# Patient Record
Sex: Male | Born: 1940 | Race: Black or African American | Hispanic: No | Smoking: Former smoker
Health system: Southern US, Community
[De-identification: ages and names within clinical notes are randomized; demographics above are authoritative.]

## PROBLEM LIST (undated history)

## (undated) DIAGNOSIS — K219 Gastro-esophageal reflux disease without esophagitis: Secondary | ICD-10-CM

## (undated) DIAGNOSIS — I2510 Atherosclerotic heart disease of native coronary artery without angina pectoris: Secondary | ICD-10-CM

## (undated) DIAGNOSIS — R1319 Other dysphagia: Secondary | ICD-10-CM

## (undated) DIAGNOSIS — E785 Hyperlipidemia, unspecified: Secondary | ICD-10-CM

## (undated) DIAGNOSIS — G309 Alzheimer's disease, unspecified: Secondary | ICD-10-CM

## (undated) DIAGNOSIS — Z8719 Personal history of other diseases of the digestive system: Secondary | ICD-10-CM

## (undated) DIAGNOSIS — I10 Essential (primary) hypertension: Secondary | ICD-10-CM

## (undated) DIAGNOSIS — M069 Rheumatoid arthritis, unspecified: Secondary | ICD-10-CM

## (undated) DIAGNOSIS — F0280 Dementia in other diseases classified elsewhere without behavioral disturbance: Secondary | ICD-10-CM

## (undated) DIAGNOSIS — Z9889 Other specified postprocedural states: Secondary | ICD-10-CM

## (undated) DIAGNOSIS — K222 Esophageal obstruction: Secondary | ICD-10-CM

## (undated) HISTORY — DX: Other dysphagia: R13.19

## (undated) HISTORY — DX: Essential (primary) hypertension: I10

## (undated) HISTORY — DX: Gastro-esophageal reflux disease without esophagitis: K21.9

## (undated) HISTORY — DX: Hyperlipidemia, unspecified: E78.5

## (undated) HISTORY — PX: OTHER SURGICAL HISTORY: SHX169

## (undated) HISTORY — DX: Esophageal obstruction: K22.2

## (undated) HISTORY — DX: Rheumatoid arthritis, unspecified: M06.9

## (undated) HISTORY — DX: Atherosclerotic heart disease of native coronary artery without angina pectoris: I25.10

## (undated) HISTORY — DX: Other specified postprocedural states: Z98.890

## (undated) HISTORY — PX: CATARACT EXTRACTION, BILATERAL: SHX1313

## (undated) HISTORY — DX: Personal history of other diseases of the digestive system: Z87.19

---

## 1994-10-05 HISTORY — PX: CORONARY ARTERY BYPASS GRAFT: SHX141

## 2002-04-03 ENCOUNTER — Emergency Department (HOSPITAL_COMMUNITY): Admission: EM | Admit: 2002-04-03 | Discharge: 2002-04-03 | Payer: Self-pay | Admitting: Emergency Medicine

## 2004-12-31 ENCOUNTER — Ambulatory Visit: Payer: Self-pay | Admitting: Cardiology

## 2005-01-01 ENCOUNTER — Ambulatory Visit: Payer: Self-pay | Admitting: Cardiology

## 2005-01-08 ENCOUNTER — Ambulatory Visit: Payer: Self-pay

## 2005-01-22 ENCOUNTER — Ambulatory Visit: Payer: Self-pay | Admitting: Cardiology

## 2005-01-27 ENCOUNTER — Ambulatory Visit (HOSPITAL_COMMUNITY): Admission: RE | Admit: 2005-01-27 | Discharge: 2005-01-27 | Payer: Self-pay | Admitting: Cardiology

## 2005-01-27 ENCOUNTER — Ambulatory Visit: Payer: Self-pay | Admitting: Cardiology

## 2005-03-03 ENCOUNTER — Ambulatory Visit: Payer: Self-pay | Admitting: Cardiology

## 2005-08-10 ENCOUNTER — Ambulatory Visit: Payer: Self-pay | Admitting: Cardiology

## 2005-08-12 ENCOUNTER — Ambulatory Visit: Payer: Self-pay | Admitting: Cardiology

## 2006-06-30 ENCOUNTER — Ambulatory Visit: Payer: Self-pay | Admitting: Cardiology

## 2006-07-05 ENCOUNTER — Ambulatory Visit: Payer: Self-pay | Admitting: Cardiology

## 2006-10-08 ENCOUNTER — Ambulatory Visit: Payer: Self-pay | Admitting: Cardiology

## 2006-11-05 ENCOUNTER — Ambulatory Visit: Payer: Self-pay | Admitting: Cardiology

## 2006-11-05 LAB — CONVERTED CEMR LAB
BUN: 14 mg/dL (ref 6–23)
CO2: 29 meq/L (ref 19–32)
Calcium: 9.4 mg/dL (ref 8.4–10.5)
Chloride: 104 meq/L (ref 96–112)
Creatinine, Ser: 1.1 mg/dL (ref 0.4–1.5)
GFR calc Af Amer: 86 mL/min
GFR calc non Af Amer: 71 mL/min
Glucose, Bld: 108 mg/dL — ABNORMAL HIGH (ref 70–99)
Potassium: 4.2 meq/L (ref 3.5–5.1)
Sodium: 139 meq/L (ref 135–145)

## 2006-11-16 ENCOUNTER — Ambulatory Visit: Payer: Self-pay | Admitting: Cardiology

## 2006-11-16 LAB — CONVERTED CEMR LAB
ALT: 40 units/L (ref 0–40)
AST: 32 units/L (ref 0–37)
Albumin: 3.4 g/dL — ABNORMAL LOW (ref 3.5–5.2)
Alkaline Phosphatase: 64 units/L (ref 39–117)
BUN: 16 mg/dL (ref 6–23)
Bilirubin, Direct: 0.1 mg/dL (ref 0.0–0.3)
CO2: 26 meq/L (ref 19–32)
Calcium: 9.5 mg/dL (ref 8.4–10.5)
Chloride: 96 meq/L (ref 96–112)
Creatinine, Ser: 0.9 mg/dL (ref 0.4–1.5)
GFR calc Af Amer: 109 mL/min
GFR calc non Af Amer: 90 mL/min
Glucose, Bld: 102 mg/dL — ABNORMAL HIGH (ref 70–99)
Potassium: 3.7 meq/L (ref 3.5–5.1)
Sed Rate: 50 mm/hr — ABNORMAL HIGH (ref 0–20)
Sodium: 135 meq/L (ref 135–145)
TSH: 0.5 microintl units/mL (ref 0.35–5.50)
Total Bilirubin: 1.1 mg/dL (ref 0.3–1.2)
Total CK: 244 units/L (ref 7–195)
Total Protein: 7.8 g/dL (ref 6.0–8.3)

## 2006-11-17 ENCOUNTER — Ambulatory Visit: Payer: Self-pay | Admitting: Internal Medicine

## 2006-11-24 ENCOUNTER — Encounter: Payer: Self-pay | Admitting: Internal Medicine

## 2006-11-24 LAB — CONVERTED CEMR LAB
ANA Titer 1: 1:160 {titer} — ABNORMAL HIGH
Anti Nuclear Antibody(ANA): POSITIVE — AB

## 2006-12-03 ENCOUNTER — Ambulatory Visit: Payer: Self-pay | Admitting: Internal Medicine

## 2007-01-11 ENCOUNTER — Ambulatory Visit: Payer: Self-pay | Admitting: Cardiology

## 2007-05-06 ENCOUNTER — Ambulatory Visit: Payer: Self-pay | Admitting: Internal Medicine

## 2007-07-07 ENCOUNTER — Ambulatory Visit: Payer: Self-pay | Admitting: Cardiology

## 2007-08-10 ENCOUNTER — Encounter: Payer: Self-pay | Admitting: Internal Medicine

## 2007-11-10 ENCOUNTER — Encounter: Payer: Self-pay | Admitting: Internal Medicine

## 2008-02-04 ENCOUNTER — Encounter: Payer: Self-pay | Admitting: *Deleted

## 2008-02-04 DIAGNOSIS — E785 Hyperlipidemia, unspecified: Secondary | ICD-10-CM

## 2008-02-04 DIAGNOSIS — Z9189 Other specified personal risk factors, not elsewhere classified: Secondary | ICD-10-CM | POA: Insufficient documentation

## 2008-02-04 DIAGNOSIS — Z951 Presence of aortocoronary bypass graft: Secondary | ICD-10-CM | POA: Insufficient documentation

## 2008-02-04 DIAGNOSIS — D126 Benign neoplasm of colon, unspecified: Secondary | ICD-10-CM | POA: Insufficient documentation

## 2008-02-04 DIAGNOSIS — I2510 Atherosclerotic heart disease of native coronary artery without angina pectoris: Secondary | ICD-10-CM

## 2008-02-04 DIAGNOSIS — I10 Essential (primary) hypertension: Secondary | ICD-10-CM | POA: Insufficient documentation

## 2008-02-07 ENCOUNTER — Encounter: Payer: Self-pay | Admitting: Internal Medicine

## 2008-06-05 ENCOUNTER — Encounter: Payer: Self-pay | Admitting: Internal Medicine

## 2008-07-03 DIAGNOSIS — F17200 Nicotine dependence, unspecified, uncomplicated: Secondary | ICD-10-CM

## 2008-07-04 ENCOUNTER — Ambulatory Visit: Payer: Self-pay | Admitting: Internal Medicine

## 2008-07-04 DIAGNOSIS — K219 Gastro-esophageal reflux disease without esophagitis: Secondary | ICD-10-CM | POA: Insufficient documentation

## 2008-07-04 DIAGNOSIS — R1319 Other dysphagia: Secondary | ICD-10-CM | POA: Insufficient documentation

## 2008-07-04 DIAGNOSIS — M069 Rheumatoid arthritis, unspecified: Secondary | ICD-10-CM | POA: Insufficient documentation

## 2008-07-04 DIAGNOSIS — R634 Abnormal weight loss: Secondary | ICD-10-CM

## 2008-07-05 ENCOUNTER — Ambulatory Visit: Payer: Self-pay | Admitting: Cardiology

## 2008-07-09 ENCOUNTER — Ambulatory Visit: Payer: Self-pay | Admitting: Internal Medicine

## 2008-07-09 ENCOUNTER — Encounter: Payer: Self-pay | Admitting: Internal Medicine

## 2008-07-09 ENCOUNTER — Ambulatory Visit: Payer: Self-pay | Admitting: Cardiology

## 2008-07-09 LAB — CONVERTED CEMR LAB
Cholesterol: 133 mg/dL (ref 0–200)
HDL: 37.7 mg/dL — ABNORMAL LOW (ref >39.0)
LDL Cholesterol: 79 mg/dL (ref 0–99)
Total CHOL/HDL Ratio: 3.5
Triglycerides: 83 mg/dL (ref 0–149)
VLDL: 17 mg/dL (ref 0–40)

## 2008-07-16 ENCOUNTER — Encounter: Payer: Self-pay | Admitting: Internal Medicine

## 2008-08-28 ENCOUNTER — Ambulatory Visit: Payer: Self-pay | Admitting: Internal Medicine

## 2008-10-09 ENCOUNTER — Encounter: Payer: Self-pay | Admitting: Internal Medicine

## 2009-03-14 ENCOUNTER — Encounter: Payer: Self-pay | Admitting: Internal Medicine

## 2009-05-22 ENCOUNTER — Encounter (INDEPENDENT_AMBULATORY_CARE_PROVIDER_SITE_OTHER): Payer: Self-pay | Admitting: *Deleted

## 2009-07-31 ENCOUNTER — Ambulatory Visit: Payer: Self-pay | Admitting: Cardiology

## 2009-08-07 ENCOUNTER — Telehealth (INDEPENDENT_AMBULATORY_CARE_PROVIDER_SITE_OTHER): Payer: Self-pay

## 2009-08-08 ENCOUNTER — Encounter (HOSPITAL_COMMUNITY): Admission: RE | Admit: 2009-08-08 | Discharge: 2009-10-02 | Payer: Self-pay | Admitting: Cardiology

## 2009-08-08 ENCOUNTER — Ambulatory Visit: Payer: Self-pay | Admitting: Internal Medicine

## 2009-08-08 ENCOUNTER — Ambulatory Visit: Payer: Self-pay | Admitting: Cardiology

## 2009-08-08 ENCOUNTER — Ambulatory Visit: Payer: Self-pay

## 2009-08-08 DIAGNOSIS — I10 Essential (primary) hypertension: Secondary | ICD-10-CM

## 2009-08-22 ENCOUNTER — Encounter: Payer: Self-pay | Admitting: Cardiology

## 2009-08-22 ENCOUNTER — Encounter: Payer: Self-pay | Admitting: Internal Medicine

## 2009-08-22 LAB — CONVERTED CEMR LAB
ALT: 19 units/L (ref 0–53)
AST: 22 units/L (ref 0–37)
Albumin: 3.8 g/dL (ref 3.5–5.2)
Alkaline Phosphatase: 68 units/L (ref 39–117)
Bilirubin, Direct: 0 mg/dL (ref 0.0–0.3)
Cholesterol: 182 mg/dL (ref 0–200)
HDL: 39.9 mg/dL (ref >39.00)
LDL Cholesterol: 127 mg/dL — ABNORMAL HIGH (ref 0–99)
Total Bilirubin: 1 mg/dL (ref 0.3–1.2)
Total CHOL/HDL Ratio: 5
Total Protein: 7.4 g/dL (ref 6.0–8.3)
Triglycerides: 76 mg/dL (ref 0.0–149.0)
VLDL: 15.2 mg/dL (ref 0.0–40.0)

## 2009-09-02 ENCOUNTER — Ambulatory Visit: Payer: Self-pay

## 2009-09-02 ENCOUNTER — Encounter: Payer: Self-pay | Admitting: Cardiology

## 2009-09-02 ENCOUNTER — Ambulatory Visit (HOSPITAL_COMMUNITY): Admission: RE | Admit: 2009-09-02 | Discharge: 2009-09-02 | Payer: Self-pay | Admitting: Cardiology

## 2009-09-02 ENCOUNTER — Ambulatory Visit: Payer: Self-pay | Admitting: Cardiology

## 2010-06-19 ENCOUNTER — Encounter: Payer: Self-pay | Admitting: Internal Medicine

## 2010-11-04 NOTE — Letter (Signed)
Summary: Stacey Drain MD  Stacey Drain MD   Imported By: Lennie Odor 06/27/2010 11:51:32  _____________________________________________________________________  External Attachment:    Type:   Image     Comment:   External Document

## 2010-11-12 ENCOUNTER — Encounter: Payer: Self-pay | Admitting: Cardiology

## 2010-12-03 ENCOUNTER — Other Ambulatory Visit: Payer: Self-pay | Admitting: Dermatology

## 2010-12-16 NOTE — Letter (Signed)
Summary: [facility] Office Visit Note   [facility] Office Visit Note   Imported By: Roderic Ovens 12/08/2010 15:31:51  _____________________________________________________________________  External Attachment:    Type:   Image     Comment:   External Document

## 2011-02-02 ENCOUNTER — Other Ambulatory Visit: Payer: Self-pay | Admitting: Cardiology

## 2011-02-17 NOTE — Assessment & Plan Note (Signed)
[facility]                            CARDIOLOGY OFFICE NOTE   YALE, GOLLA                        MRN:          [id number]  DATE:07/07/2007                            DOB:          [birth year]    PRIMARY CARE PHYSICIAN:  Dr. Illene Regulus.   REASON FOR PRESENTATION:  Evaluate patient with coronary disease and  hypertension.   HISTORY OF PRESENT ILLNESS:  The patient presents for followup. At the  last visit I did start him on Norvasc 2.5 mg for his blood pressure.  However, he got confused and did not take that. He did bring the bottle  with him today and said he just found it this morning. He has been  bothered by joint pains and is seeing Dr. Kellie Simmering. He is disappointed  that he is having increased discomfort with therapy but is going to  follow up with Dr. Kellie Simmering to see if he can get relief. He has had no  chest discomfort, neck, or arm discomfort. He is not describing any  palpitations, pre syncope, or syncope. He has been limited in his  activities because of his joint pain.   PAST MEDICAL HISTORY:  Coronary artery disease status post CABG (see the  January 11, 2007 note for details), hypertension, left digit amputation,  dyslipidemia, myositis.   ALLERGIES:  None.   MEDICATIONS:  1. Osteo Bi-flex.  2. Naproxen 500 mg b.i.d.  3. Aspirin 325 mg.  4. Colchicine 0.6 mg b.i.d.  5. Klor-con 10 mEq daily.  6. Benazepril 20/12.5 two tablets daily.  7. Nexium 40 mg daily.  8. Prednisone 10 mg daily.   REVIEW OF SYSTEMS:  As stated in the HPI and otherwise negative for  other systems.   PHYSICAL EXAMINATION:  Patient is in no distress. Blood pressure 150/88,  heart rate 90 and regular, body mass index 26.  HEENT: Eyelids unremarkable, pupils equal, round, and reactive to light,  fundi not visualized, oral mucosa unremarkable.  NECK: No jugular venous distension at 45 degrees. Carotid upstrokes  brisk and symmetric. No bruits or  thyromegaly.  LYMPHATICS: No adenopathy.  LUNGS: Clear to auscultation bilaterally.  BACK: No costovertebral angle tenderness.  CHEST: Well-healed sternotomy scar.  HEART: PMI not displaced or sustained. S1 and S2 within normal limits.  No S3. No S4, clicks, rubs, or murmurs.  ABDOMEN: Flat, positive bowel sounds normal to frequency and pitch. No  bruits, rebound, guarding. No midline pulsatile mass. No organomegaly.  SKIN: No rashes. No nodules.  EXTREMITIES: 2+ pulses. No edema.   ASSESSMENT/PLAN:  1. Hypertension, I have encouraged him to start the 2.5 mg of Norvasc.      He can have his blood pressure followed by Dr. Debby Bud going      forward.  2. Coronary disease, he is not having any anginal symptoms. He will      continue with secondary risk reduction.  3. Dyslipidemia, patient was doing well on simvastatin. I do not see      it on his med list. We will call him and clarify that he has  that      at home. We will encourage continued use of this.  4. Follow up, we will see him back in 12 months or sooner if needed.     Rollene Rotunda, MD, [facility]  Electronically Signed    JH/MedQ  DD: 07/07/2007  DT: 07/07/2007  Job #: [id number]   cc:   Rosalyn Gess. Norins, MD

## 2011-02-17 NOTE — Assessment & Plan Note (Signed)
[facility]                            CARDIOLOGY OFFICE NOTE   OFFIE, WAIDE                        MRN:          [id number]  DATE:07/05/2008                            DOB:          [birth year]    PRIMARY CARE PHYSICIAN:  Rosalyn Gess. Norins, MD   REASON FOR PRESENTATION:  Evaluate the patient with coronary disease and  hypertension.   HISTORY OF PRESENT ILLNESS:  The patient returns for yearly followup.  Since I last saw him, he has been doing well.  He has actually lost  weight.  He never did start the Norvasc I suggested.  However, his blood  pressure appears to be well controlled.  He has not had any new  cardiovascular problems.  In particular, he is not having any chest pain  or shortness of breath.  He does not exercise as much as I would like,  but he is active at work.  He was having joint pains when I last saw  him, but these seemed to have resolved.  Of note, he has had very  frequent and persistent hiccups, and actually he is due to see Dr. Marina Goodell  and have an EGD on July 09, 2008.  The patient has had no shortness of  breath.  He has had no PND or orthopnea.  He has had no palpitation,  presyncope, or syncope.   PAST MEDICAL HISTORY:  Coronary artery disease status post CABG (see the  January 11, 2007, note for details), hypertension, left digit amputation,  dyslipidemia, myositis.   ALLERGIES:  None.   MEDICATIONS:  1. Osteo Bi-Flex.  2. Naproxen 500 mg daily.  3. Aspirin 325 mg daily.  4. Colchicine 0.6 mg b.i.d.  5. Klor-Con 10 mg daily.  6. Benazepril HCT 20/12.5 two tablets daily.  7. Nexium 40 mg daily.  8. Prednisone 10 mg daily,  9. Pravastatin 80 mg nightly.   REVIEW OF SYSTEMS:  As stated in the HPI and otherwise negative for  other systems.   PHYSICAL EXAMINATION:  GENERAL:  The patient is in no distress.  VITAL SIGNS:  Blood pressure 126/84, heart rate 92 and regular, weight  188 pounds.  HEENT:  Eyelids  unremarkable, pupils equal, round, and reactive to  light, fundi not visualized, oral mucosa unremarkable.  NECK:  No jugular venous distention at 45 degrees, carotid upstroke  brisk and symmetric, no bruits, no thyromegaly.  LYMPHATICS:  No cervical, axillary, inguinal adenopathy.  LUNGS:  Clear to auscultation bilaterally.  BACK:  No costovertebral tenderness.  CHEST:  Unremarkable.  HEART:  PMI not displaced or sustained, S1 and S2 within normal limits,  no S3, no S4, no clicks, no rubs, no murmurs.  ABDOMEN:  Flat, positive bowel sounds normal in frequency and pitch, no  bruits, no rebound, no guarding, no midline pulsatile mass, no  hepatomegaly, no splenomegaly.  SKIN:  No rashes, no nodules.  EXTREMITIES:  Pulse 2+ throughout, no edema, no cyanosis, no clubbing.  NEUROLOGIC:  Oriented to person, place, and time, cranial nerves II  through XII grossly intact, motor grossly  intact throughout.   EKG:  Sinus rhythm, rate 80, leftward axis, no acute ST-wave changes.   ASSESSMENT AND PLAN:  1. Coronary artery disease.  The patient does have 70 year old bypass      grafts.  He is not exercising as routinely as I would like.  It has      been 3-1/2 years since his last stress test.  Given this, it is      prudent to screen him with another stress perfusion study.  He      would be able to walk on a treadmill.  2. Dyslipidemia.  I am going to have him get his lipids checked when      he comes back for his esophagogastroduodenoscopy on July 09, 2008.  I have sent a note to Dr. Marina Goodell to please do this and I      entered it in the computer.  3. Hypertension.  Blood pressure is borderline.  It is controlled      today, but last 2 readings have been slightly above 140 though the      diastolics have been well controlled.  He needs to keep a close eye      on this and may need up titration of his medications.  4. Hiccups per Dr. Marina Goodell.  5. Followup.  I would see him back in 1 year  provided he has no      abnormalities on his stress test.     Rollene Rotunda, MD, [facility]  Electronically Signed    JH/MedQ  DD: 07/05/2008  DT: 07/06/2008  Job #: [id number]   cc:   Rosalyn Gess. Norins, MD

## 2011-02-20 NOTE — Cardiovascular Report (Signed)
NAME:  DEVONTAE, CASASOLA NO.:  [id number]   MEDICAL RECORD NO.:  [id number]          PATIENT TYPE:  OIB   LOCATION:  2899                         FACILITY:  [facility]   PHYSICIAN:  Rollene Rotunda, M.D.   DATE OF BIRTH:  24-Nov-1940   DATE OF PROCEDURE:  01/27/2005  DATE OF DISCHARGE:                              CARDIAC CATHETERIZATION   PRIMARY CARE PHYSICIAN:  None.   PROCEDURE:  Left heart catheterization/coronary arteriography.   INDICATIONS:  Evaluate a patient with chest pain and abnormal Cardiolite. He  was bypassed in 1996.  The Cardiolite suggested inferolateral infarct, with  mild peri-infarct ischemia.   PROCEDURE NOTE:  Left heart catheterization was performed via the right  femoral artery.  The artery was cannulated using anterior wall puncture.  A  6-French arterial sheath was inserted via the modified Seldinger technique.  Preformed Judkins and a pigtail catheter were utilized.  The patient  tolerated the procedure well and let the lab in stable condition.   RESULTS:   HEMODYNAMICS:  1.  Left ventricular pressure:  201/31.  2.  Aortic pressure:  204/101.   CORONARIES:  1.  LEFT MAIN:  Normal.  2.  LEFT ANTERIOR DESCENDING ARTERY:  Had proximal calcification.  It was      occluded in the mid segment.  Small first and second diagonal both had      mid 30% lesions.  The distal LAD was seen to fill via the LIMA.  3.  CIRCUMFLEX:  In the AV groove there was proximal diffuse moderate      disease.  The first obtuse marginal was occluded  proximally.  The      second obtuse marginal had proximal 95% stenosis and then was occluded.  4.  RIGHT CORONARY ARTERY:  Had diffuse moderate disease.  It was long,      proximal mid 60% stenosis.  PDA and posterolateral were moderate sized      and normal.  5.  GRAFTS:      1.  The LIMA to the LAD was patent.      2.  A saphenous vein graft sequential to the PDA posterolateral was          patent, with diffuse  luminal irregularities.      3.  Saphenous vein graft to OM2 was patent, with long proximal 25%          stenosis.      4.  Saphenous vein graft to OM1 had diffuse 25% stenosis, with layering          blood flow.  There was a 50-60% stenosis in the native OM1,          immediately after the graft insertion.  This did not appear to be          flow limiting.  It was not in the area of ischemia noted on          Cardiolite.   LEFT VENTRICULOGRAPHY:  The left ventriculogram was obtained in the RAO  projection.  The EF was approximately 50%, with mild global  hypokinesis.   CONCLUSION:  Severe native three-vessel coronary artery disease.  Moderate  obstruction of the mid obtuse marginal, after the saphenous vein graft  insertion site.   PLAN:  The patient will have to have aggressive secondary risk reduction.      JH/MEDQ  D:  01/27/2005  T:  01/27/2005  Job:  [id number]

## 2011-02-20 NOTE — Assessment & Plan Note (Signed)
[facility]                           PRIMARY CARE OFFICE NOTE   [name], [name]                        MRN:          [id number]  DATE:11/17/2006                            DOB:          02-22-1941    [name] is a 70 year old African American gentleman who presents to  establish for ongoing care.   CHIEF COMPLAINT:  The patient has been having problems with migratory  muscular pain and discomfort.  He began on September 16, 2007 with pain  in his left foot, the lateral aspect.  This migrated then to pain in the  left upper extremity which was worse proximally, but then improved  followed by pain in his left leg, popliteal area, which was very painful  for movement and pain in his right foot.  On questioning the patient  reports he has had 6 months of migrating pain, mostly in the soft  tissue/muscle area without really specific joint involvement.  The  patient recently had laboratory ordered by the cardiology service which  found the patient to have a mildly elevated creatinine kinase.  He is  referred for further evaluation for his multiple pain.   PAST MEDICAL HISTORY:  SURGICAL:  1. 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  2. Bypass surgery in 1996.   MEDICAL ILLNESSES:  1. Usual childhood disease, including diphtheria.  2. Hypertension diagnosed at age 66.  3. Coronary artery disease.  4. Hyperlipidemia.   HABITS:  Tobacco:  Patient quit in 1996.  Alcohol:  None.  THE PATIENT  HAS NO KNOWN DRUG ALLERGIES.   FAMILY HISTORY:  Father died of MI, mother died of complications of  diabetes and hypertension, she also had heart disease.  One brother with  bypass surgery, 2 sisters, one with bypass surgery, one in good health  at age 98.  There is a positive family history for diabetes, prostate  cancer.   SOCIAL HISTORY:  Patient has completed the eighth grade.  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  The patient was married 21 years, divorced in 52.  Has remained a  confirmed bachelor.  He has 2 daughters and 2 sons, 5 grandchildren.  The patient lives alone.  He is independent in his ADLs.  His daughters  do look in on him.   EXAMINATION:  Temperature 98.7, blood pressure 132/80, pulse was 102.  GENERAL APPEARANCE:  This is a large boned, heavyset African American  gentleman in no acute distress.  HEENT:  Unremarkable.  CHEST:  Clear, heart rate was regular with no murmurs, rubs, or gallops.  MUSCULOSKELETAL:  The patient has full range of motion about all of his  major joints.  There was no signs of synovial thickening, erythema, or  other joint changes.  The patient had mild tenderness in some of his  larger muscle groups, particularly back and shoulders.  No further  examination conducted.   LABORATORY REVIEW:  TSH  was normal at 0.5, VSR was 50, LFTs were normal,  CK was elevated at 244, BMET was normal.   IMPRESSION AND PLAN:  1. 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  He will need      to return for multiple laboratories to include Sjogren's  panel      with ssA and ssB antibodies, anti-smooth muscle antibodies, Jo-1      antibodies.  Will base further recommendations and evaluation on      this initial round of laboratory.     Rosalyn Gess Norins, MD  Electronically Signed    MEN/MedQ  DD: 11/25/2006  DT: 11/25/2006  Job #: [id number]

## 2011-02-20 NOTE — Letter (Signed)
December 03, 2006     RE:  ASAIAH, SCARBER  MRN:  [id number]  /  DOB:  [birth year]   Aundra Dubin, M.D.  [address]  [place], [place] [address]   Dear Will,   Thank you very much for seeing Mr. Jamael Hoffmann. Dwan, a pleasant 70-year-  old African/American gentleman with migratory muscle pain and  discomfort.   I saw Mr. Turton as a new patient on November 17, 2006.  Please see the  enclosed complete dictation.  He was referred to me by cardiology  because of significant ongoing muscle pain and discomfort with an  elevated total CK at 244.  The patient was seen in the office, as he did  have problems with migratory aches and discomfort, to a point that it  really limited his activity, depending upon which muscle group was  involved.   His other laboratory from November 16, 2006, included a sedimentation  rate of 50.  His CK was 244.  His basic metabolic panel was normal, with  a blood sugar of 102 and normal creatinine.  His liver functions were  all normal.  His thyroid function was normal with a TSH of 0.5.   After evaluating the patient, I ordered additional laboratory including  anti-Yo antibodies, anti-Ro and  anti-La antibodies, RNP and ANA.  I  will enclose copies of those lab results for you.  All of these labs  were normal except for the ANA which came back with a titer of 1:160.   CURRENT MEDICATIONS:  1. Simvastatin, but that has been discontinued since his elevated CK,      and he has continued to have discomfort.  2. He takes q/hydrochlorothiazide 230/12.5 mg daily.  3. Aspirin 325 mg, 1/2 tab daily.  4. Multivitamins.   The patient remains otherwise stable.  He has been stable from a cardiac  perspective.   I appreciate your assistance in evaluating this patient.  I look forward  to hearing from you.  If I can provide any additional information, do  not hesitate to contact me.    Sincerely,      Rosalyn Gess. Norins, MD  Electronically Signed    MEN/MedQ  DD: 12/03/2006  DT: 12/03/2006  Job #: [id number]

## 2011-02-20 NOTE — Assessment & Plan Note (Signed)
[facility]                              CARDIOLOGY OFFICE NOTE   Justin Griffin, Justin Griffin                        MRN:          [id number]  DATE:07/05/2006                            DOB:          1941-01-12    PRIMARY:  None.   REASON FOR PRESENTATION:  Evaluate patient with coronary disease.   HISTORY OF PRESENT ILLNESS:  The patient returns for follow up.  It has been  a year since I last saw him.  He said since I saw him he has been bothered  by muscle aches.  These started in his legs but have moved to his arms.  He  did come for blood work and was noted to have mildly elevated liver enzymes  with an AST of 42 and an ALT of 52.  He had a creatinine level of 241.  He  does not describe cramping with exertion but simply fatigued and sore legs  and limbs and joint pains.  He denies any chest discomfort.  He has had no  neck or arm discomfort.  He has had no palpitation, no presyncope or  syncope.  He denies any PND or orthopnea.   PAST MEDICAL HISTORY:  1. Coronary artery disease status post CABG (LIMA to the LAD, SVG to PDA      and posterolateral, SVG to second obtuse marginal, SVG to first obtuse      marginal), mildly reduced ejection fraction (approximately 50%).  2. Hypertension.  3. Left digit amputation.  4. Dyslipidemia.   ALLERGIES:  NONE.   CURRENT MEDICATIONS:  1. Simvastatin 40 mg a day.  2. Metoprolol 75 mg b.i.d.  3. Benazepril 20 mg b.i.d.  4. Hydrochlorothiazide 12.5 mg b.i.d.   REVIEW OF SYSTEMS:  As stated in the HPI, otherwise negative for other  systems.   PHYSICAL EXAMINATION:  Patient is in no distress.  Blood pressure 138/90,  heart rate 83 and regular, weight 214 pounds, body mass index 29.  HEENT:  Eyes unremarkable, pupils equal, round, react to light, fundi not  visualized, oral mucosa unremarkable.  NECK:  No jugular venous distention, wave form within normal limits, carotid  upstroke brisk and symmetric, no  bruits, no thyromegaly.  LYMPHATICS:  No cervical, axillary or inguinal adenopathy.  LUNGS:  Clear to auscultation bilaterally.  BACK:  No costovertebral angle tenderness.  CHEST:  Unremarkable.  HEART:  PMI not displaced or sustained, S1 and S2 within normal limits, no  S3, no S4, no murmurs.  ABDOMEN:  Obese, positive bowel sounds, normal in frequency and pitch, no  bruits, no rebound, no guarding __________, no masses, no organomegaly.  SKIN:  No rashes, no nodules.  EXTREMITIES:  Pulses 2+, no edema.   EKG:  Sinus rhythm, rate 83, axis within normal limits, intervals within  normal limits, no acute ST T wave changes.   ASSESSMENT AND PLAN:  1. Coronary disease.  The patient is having no symptoms related to this.      He had a stress perfusion study last year.  No further cardiovascular  testing is suggested at this point.  2. Muscle aches.  The patient has myalgias and also has an elevated CK.      Therefore, he needs to come off his simvastatin.  He will stop it for a      month.  I am going to have him start pravastatin back at 80 mg.  He had      some lipid control while he was on that medication.  He says his diet      is better than it was previously.  Therefore, perhaps he will be at      target with that medicine.  3. Hypertension.  Blood pressure is well controlled.  He will continue the      medications as listed.  4. Follow up.  I will see him back in three months.  He has written      instructions to get his liver enzymes, CK MB and lipids in three      months.  He is to let me know if he has any worsening symptoms rather      than improvement.            ______________________________  Rollene Rotunda, MD, [facility]     JH/MedQ  DD:  07/05/2006  DT:  07/06/2006  Job #:  [id number]

## 2011-02-20 NOTE — Assessment & Plan Note (Signed)
[facility]                            CARDIOLOGY OFFICE NOTE   Justin Griffin, Justin Griffin                        MRN:          [id number]  DATE:11/16/2006                            DOB:          1941-09-09    PATIENT'S PHYSICIAN:  Rollene Rotunda, M.D.   This is a 70 year old African-American male patient with history of  coronary artery disease status post CABG x5 in 1996.  He comes in today  because of recurrent muscle aches.  Back in October he saw Dr. Antoine Poche  for muscle aches and at that time he had an elevated CK and was told to  stop his simvastatin and pravastatin was supposed to be started, which I  do not think the patient ever started.  The patient's repeat labs came  back normal but he said the muscle aches continued.  There was confusion  over his medications.  I asked him to bring it in.  He claims he is  still not taking the simvastatin but continues to take benazepril,  potassium, and aspirin.  Recent BMET was normal but other labs were not  checked.  He says he has muscle aches all over, particularly his legs  and arms, and sometimes it is hard to walk.  By the end of the day it  does improve some.  He no longer has a primary medical doctor and would  like to be referred.   CURRENT MEDICATIONS:  1. Klor-Con 10 mEq daily.  2. Benazepril/hydrochlorothiazide 20/12.5 daily.  3. Aspirin he has cut back to 81 mg daily.   PHYSICAL EXAMINATION:  GENERAL:  This is a pleasant 70 year old well-  built African-American male in no acute distress.  VITAL SIGNS:  Blood pressure 138/80, pulse 90, weight 203.  NECK:  Without JVD, HJR, bruit or thyroid enlargement.  LUNGS:  Clear anterior, posterior and lateral.  HEART:  Regular rate and rhythm at 90 beats per minute.  Normal S1 and  S2.  No murmur, rub, bruit, thrill, or heave noted.  ABDOMEN:  Soft without organomegaly, masses, lesions or abnormal  tenderness.  EXTREMITIES:  Without cyanosis,  clubbing or edema.  He has good distal  pulses.  He is not tender.  He has no muscle tenderness to palpation.   IMPRESSION:  1. Muscle aches continue off simvastatin.  2. Coronary artery disease status post coronary artery bypass grafting      x5 in 1996 with a left internal mammary artery to the left anterior      descending artery, saphenous vein graft to the posterior descending      artery and posterolateral coronary artery, saphenous vein graft to      obtuse marginal two, and saphenous vein graft to the obtuse      marginal one; mildly-reduced ejection fraction of 50%.  3. Hypertension.  4. Dyslipidemia.  5. Left digit amputation.   PLAN AT THIS TIME:  I will recheck CK, LFTs, sed rate, and CMET, and we  have scheduled him to see a primary M.D. as soon as possible in our  practice for further workup  on this.  He will see Dr. Antoine Poche back in 2  months.      Justin Reedy, PA-C  Electronically Signed      Luis Abed, MD, [facility]  Electronically Signed   ML/MedQ  DD: 11/16/2006  DT: 11/16/2006  Job #: [id number]

## 2011-02-20 NOTE — Assessment & Plan Note (Signed)
[facility]                            CARDIOLOGY OFFICE NOTE   Justin Griffin, Justin Griffin                        MRN:          [id number]  DATE:01/11/2007                            DOB:          [birth year]    PRIMARY:  Dr. Debby Bud.   REASON FOR PRESENTATION:  Evaluate the patient with coronary artery  disease.   HISTORY OF PRESENT ILLNESS:  The patient presents for followup.  Since I  last saw him, he has been seen by Dr. Debby Bud and Dr. Kellie Simmering for  treatment of myositis.  The etiology of this is not clear.  He had a lot  of aches and pains, especially in his feet, with this.  He says this was  slowly resolving.  However, this has limited his activity.  He has not  had any chest pressure or neck discomfort consistent with angina.  He  has had no shortness of breath.  Denies any PND or orthopnea.  He has  had no palpitations, pre-syncope, or syncope.  Of note, he has started  having reflux.  He says he notices it after eating meals, especially if  he eats them quickly.  this is a new symptom for him.  He describes a  burning discomfort and feels like the food is coming back up.  Again, it  only happens after eating and is not reproducible with activity or in  between meals.   PAST MEDICAL HISTORY:  Coronary artery disease status post CABG (status  post catheterization in 2006, the left main was normal, the LAD showed  proximal calcification and mid occlusion, the first and second diagonal  were small with mid 30% stenosis, circumflex in the AV groove had  proximal diffuse moderate disease, second obtuse marginal had proximal  95% followed by mid occlusion, the first obtuse marginal was occluded  proximally, the right coronary artery had diffuse moderate disease with  a long proximal mid 60% stenosis.  PDA and posterior lateral were  normal.  LIMA to the LAD was patent.  Saphenous vein graft to the PDA  and posterolateral were patent with diffuse luminal  irregularities.  Saphenous vein graft to the second obtuse marginal was patent with a  long proximal 25% stenosis.  Saphenous vein graft to the first obtuse  marginal had diffuse 25% stenosis with an EF of 50%.  January 27, 2005).  Hypertension.  Left digit amputation.  Dyslipidemia.  Myositis.   ALLERGIES:  None.   CURRENT MEDICATIONS:  1. Naproxen.  2. Klor-Con 10 mEq daily.  3. Benazepril 20/12.5 b.i.d.  4. Colchicine 0.6 mg b.i.d.  5. Aspirin 1/2 of a 325 mg tablet daily.   REVIEW OF SYSTEMS:  As stated in the HPI.  Negative for other systems.   PHYSICAL EXAMINATION:  The patient is in no distress.  Blood pressure 157/91, heart rate 79 and regular, weight 211 pounds,  body mass index 28.  HEENT:  Eyelids unremarkable.  Pupils are equal, round, and reactive to  light and accommodation.  Fundi not visualized.  Oral mucosa  unremarkable.  NECK:  No jugular  venous distension, wave form within normal limits,  carotid upstroke brisk and symmetric, no bruits, thyromegaly.  LYMPHATICS:  No cervical, axillary, or inguinal adenopathy.  LUNGS:  Clear to auscultation bilaterally.  BACK:  No costovertebral angle tenderness.  CHEST:  Unremarkable.  HEART:  PMI not displaced or sustained, S1 and S2 within normal limits,  no S3, no S4, no clicks, no rubs, no murmurs.  ABDOMEN:  Obese, positive bowel sounds, normal in frequency and pitch,  no bruits, rebound, guarding.  No midline pulsatile mass, hepatomegaly,  splenomegaly.  SKIN:  No rashes, no nodules.  EXTREMITIES:  With 2+ pulses throughout, no edema, cyanosis, clubbing.  NEURO:  Oriented to person, place, and time, cranial nerves 2-12 grossly  intact, motor grossly intact.   ELECTROCARDIOGRAM:  Sinus rhythm, rate 85, axis within normal limits,  intervals within normal limits, poor anterior R wave progression, no  acute ST-T wave changes.   ASSESSMENT AND PLAN:  1. Coronary artery disease.  The patient is having no symptoms       consistent with angina.  No further cardiovascular testing is      suggested.  He will continue with secondary risk reduction.  2. Reflux.  The patient has symptoms consistent with reflux.  I did      give him samples of Nexium and said that he needs to follow soon      (next couple of weeks) with Dr. Debby Bud for treatment and evaluation      of this.  3. Hypertension.  His blood pressure has been creeping up (as has his      weight).  For management of this, I will start Norvasc 2.5 mg      daily.  4. Risk reduction per Dr. Debby Bud.  I will defer lipid management to      him.  5. Followup.  Will see the patient back in about 6 months or sooner if      needed.     Rollene Rotunda, MD, [facility]  Electronically Signed    JH/MedQ  DD: 01/11/2007  DT: 01/11/2007  Job #: [id number]   cc:   Rosalyn Gess. Norins, MD

## 2011-07-15 ENCOUNTER — Other Ambulatory Visit: Payer: Self-pay | Admitting: Cardiology

## 2011-07-21 ENCOUNTER — Other Ambulatory Visit: Payer: Self-pay | Admitting: Cardiology

## 2011-10-06 ENCOUNTER — Other Ambulatory Visit: Payer: Self-pay | Admitting: Cardiology

## 2011-10-07 NOTE — Telephone Encounter (Signed)
.   Requested Prescriptions   Pending Prescriptions Disp Refills  . benazepril-hydrochlorthiazide (LOTENSIN HCT) 20-12.5 MG per tablet [Pharmacy Med Name: BENAZEPRIL-HCTZ 20-12.5 MG TAB] 60 tablet 2    Sig: TAKE 2 TABLETS BY MOUTH EVERY DAY

## 2011-12-04 ENCOUNTER — Other Ambulatory Visit: Payer: Self-pay | Admitting: *Deleted

## 2011-12-04 MED ORDER — BENAZEPRIL-HYDROCHLOROTHIAZIDE 20-12.5 MG PO TABS: 2.0000 | ORAL_TABLET | Freq: Every day | ORAL | Status: DC

## 2011-12-04 MED ORDER — POTASSIUM CHLORIDE CRYS ER 10 MEQ PO TBCR: 10.0000 meq | EXTENDED_RELEASE_TABLET | Freq: Every day | ORAL | Status: DC

## 2011-12-04 MED ORDER — AMLODIPINE BESYLATE 2.5 MG PO TABS: 2.5000 mg | ORAL_TABLET | Freq: Every day | ORAL | Status: DC

## 2012-01-20 ENCOUNTER — Other Ambulatory Visit: Payer: Self-pay | Admitting: Cardiology

## 2012-03-29 ENCOUNTER — Encounter: Payer: Self-pay | Admitting: Cardiology

## 2012-03-29 ENCOUNTER — Ambulatory Visit (INDEPENDENT_AMBULATORY_CARE_PROVIDER_SITE_OTHER): Payer: Medicare HMO | Admitting: Cardiology

## 2012-03-29 VITALS — BP 124/72 | HR 70 | Ht 72.0 in | Wt 192.0 lb

## 2012-03-29 DIAGNOSIS — E7800 Pure hypercholesterolemia, unspecified: Secondary | ICD-10-CM

## 2012-03-29 DIAGNOSIS — I2510 Atherosclerotic heart disease of native coronary artery without angina pectoris: Secondary | ICD-10-CM

## 2012-03-29 DIAGNOSIS — E785 Hyperlipidemia, unspecified: Secondary | ICD-10-CM

## 2012-03-29 DIAGNOSIS — Z79899 Other long term (current) drug therapy: Secondary | ICD-10-CM

## 2012-03-29 DIAGNOSIS — I10 Essential (primary) hypertension: Secondary | ICD-10-CM

## 2012-03-29 MED ORDER — POTASSIUM CHLORIDE CRYS ER 10 MEQ PO TBCR: 10.0000 meq | EXTENDED_RELEASE_TABLET | Freq: Every day | ORAL | Status: DC

## 2012-03-29 MED ORDER — AMLODIPINE BESYLATE 2.5 MG PO TABS: 2.5000 mg | ORAL_TABLET | Freq: Every day | ORAL | Status: DC

## 2012-03-29 MED ORDER — BENAZEPRIL-HYDROCHLOROTHIAZIDE 20-12.5 MG PO TABS: 2.0000 | ORAL_TABLET | Freq: Every day | ORAL | Status: DC

## 2012-03-29 NOTE — Progress Notes (Signed)
   HPI The patient presents for followup of coronary disease. It has been a couple of years since I last saw him. Since then he said no new cardiovascular problems. He still works in some of this is vigorous activity. With this he denies any chest pressure, neck or arm discomfort. He has no palpitations, presyncope or syncope. He has no PND or orthopnea.  Of note the patient's last stress test was 2010 with some apical thinning on perfusion imaging. His EF was thought to be 45%. However, echo showed to be 55%.  No Known Allergies  Current Outpatient Prescriptions  Medication Sig Dispense Refill  . amLODipine (NORVASC) 2.5 MG tablet Take 1 tablet (2.5 mg total) by mouth daily.  90 tablet  3  . aspirin 325 MG tablet Take 325 mg by mouth daily.      . benazepril-hydrochlorthiazide (LOTENSIN HCT) 20-12.5 MG per tablet Take 2 tablets by mouth daily.  180 tablet  3  . potassium chloride (KLOR-CON M10) 10 MEQ tablet Take 1 tablet (10 mEq total) by mouth daily.  90 tablet  3  . DISCONTD: amLODipine (NORVASC) 2.5 MG tablet Take 1 tablet (2.5 mg total) by mouth daily.  90 tablet  3  . DISCONTD: benazepril-hydrochlorthiazide (LOTENSIN HCT) 20-12.5 MG per tablet Take 2 tablets by mouth daily.  180 tablet  3  . DISCONTD: potassium chloride (KLOR-CON M10) 10 MEQ tablet Take 1 tablet (10 mEq total) by mouth daily.  90 tablet  3    Past Medical History  Diagnosis Date  . CAD (coronary artery disease)     Catheterization 2006 LAD occluded, OM 295% stenosis followed by mid occlusion, first obtuse marginal occluded, right coronary artery diffuse moderate disease. LIMA to the LAD was patent. Saphenous vein to PDA and posterior lateral patent with diffuse luminal irregularities, saphenous vein graft to second obtuse marginal is patent, saphenous vein graft to the first obtuse marginal had 25% stenosis.  . Other and unspecified hyperlipidemia   . Other dysphagia   . Unspecified essential hypertension   .  Esophageal reflux   . Rheumatoid arthritis     Past Surgical History  Procedure Date  . Coronary artery bypass graft 1996  . Left digit amputation     ROS:  As stated in the HPI and negative for all other systems.  PHYSICAL EXAM BP 124/72  Pulse 70  Ht 6' (1.829 m)  Wt 192 lb (87.091 kg)  BMI 26.04 kg/m2 GENERAL:  Well appearing HEENT:  Pupils equal round and reactive, fundi not visualized, oral mucosa unremarkable NECK:  No jugular venous distention, waveform within normal limits, carotid upstroke brisk and symmetric, no bruits, no thyromegaly LYMPHATICS:  No cervical, inguinal adenopathy LUNGS:  Clear to auscultation bilaterally BACK:  No CVA tenderness CHEST:  Unremarkable HEART:  PMI not displaced or sustained,S1 and S2 within normal limits, no S3, no S4, no clicks, no rubs, no murmurs ABD:  Flat, positive bowel sounds normal in frequency in pitch, no bruits, no rebound, no guarding, no midline pulsatile mass, no hepatomegaly, no splenomegaly EXT:  2 plus pulses throughout, no edema, no cyanosis no clubbing SKIN:  No rashes no nodules NEURO:  Cranial nerves II through XII grossly intact, motor grossly intact throughout PSYCH:  Cognitively intact, oriented to person place and time   EKG:  Sinus rhythm, leftward axis, intervals within normal limits, nonspecific inferior T-wave flattening, no acute ST-T wave changes. 03/29/2012   ASSESSMENT AND PLAN

## 2012-03-29 NOTE — Assessment & Plan Note (Signed)
The blood pressure is at target. No change in medications is indicated. We will continue with therapeutic lifestyle changes (TLC).  

## 2012-03-29 NOTE — Assessment & Plan Note (Signed)
He will have a fasting lipid profile when he returns goals LDL less than 100 and HDL greater than 40.

## 2012-03-29 NOTE — Patient Instructions (Addendum)
The current medical regimen is effective;  continue present plan and medications.  Your physician has requested that you have an exercise tolerance test. For further information please visit [email or web address] Please also follow instruction sheet, as given.  Please return fasting for lab work (lipid and liver)  Follow up will be based on the results of your treadmill

## 2012-03-29 NOTE — Assessment & Plan Note (Signed)
I will bring the patient back for a POET (Plain Old Exercise Test). This will allow me to screen for obstructive coronary disease, risk stratify and very importantly provide a prescription for exercise.   

## 2012-03-31 ENCOUNTER — Ambulatory Visit: Payer: Self-pay | Admitting: Cardiology

## 2012-04-18 ENCOUNTER — Other Ambulatory Visit (INDEPENDENT_AMBULATORY_CARE_PROVIDER_SITE_OTHER): Payer: Medicare HMO

## 2012-04-18 ENCOUNTER — Ambulatory Visit (INDEPENDENT_AMBULATORY_CARE_PROVIDER_SITE_OTHER): Payer: Medicare HMO | Admitting: Physician Assistant

## 2012-04-18 VITALS — Ht 72.0 in | Wt 190.0 lb

## 2012-04-18 DIAGNOSIS — E7800 Pure hypercholesterolemia, unspecified: Secondary | ICD-10-CM

## 2012-04-18 DIAGNOSIS — I2510 Atherosclerotic heart disease of native coronary artery without angina pectoris: Secondary | ICD-10-CM

## 2012-04-18 DIAGNOSIS — Z79899 Other long term (current) drug therapy: Secondary | ICD-10-CM

## 2012-04-18 LAB — HEPATIC FUNCTION PANEL
ALT: 15 U/L (ref 0–53)
Total Bilirubin: 0.4 mg/dL (ref 0.3–1.2)

## 2012-04-18 LAB — LIPID PANEL
Cholesterol: 191 mg/dL (ref 0–200)
HDL: 40.9 mg/dL (ref >39.00)
LDL Cholesterol: 130 mg/dL — ABNORMAL HIGH (ref 0–99)
Triglycerides: 102 mg/dL (ref 0.0–149.0)

## 2012-04-18 NOTE — Progress Notes (Signed)
Exercise Treadmill Test  Pre-Exercise Testing Evaluation Rhythm: 1st degree AVB with occ. PVC's Rate: 73   PR:  .22 QRS:  .11  QT:  .40 QTc: .44           Test  Exercise Tolerance Test Ordering MD: Angelina Sheriff, MD  Interpreting MD: Tereso Newcomer, PA-C  Unique Test No: 1  Treadmill:  1  Indication for ETT: CAD  Contraindication to ETT: No   Stress Modality: exercise - treadmill  Cardiac Imaging Performed: non   Protocol: standard Bruce - maximal  Max BP:  220/91  Max MPHR (bpm):  150 85% MPR (bpm):  127  MPHR obtained (bpm):  13 % MPHR obtained:  92%  Reached 85% MPHR (min:sec):  6:23 Total Exercise Time (min-sec):  8:00  Workload in METS:  9.9 Borg Scale: 17  Reason ETT Terminated:  patient's desire to stop    ST Segment Analysis At Rest: normal ST segments - no evidence of significant ST depression With Exercise: no evidence of significant ST depression  Other Information Arrhythmia:  Yes Angina during ETT:  absent (0) Quality of ETT:  diagnostic  ETT Interpretation:  normal - no evidence of ischemia by ST analysis  Comments: Good exercise tolerance. No chest pain. Hypertensive BP response to exercise. No ST-T changes to suggest ischemia.  One 3 beat run of NSVT in recovery (triplet).  Recommendations: Follow up with Dr. Rollene Rotunda as directed. Tereso Newcomer, PA-C  9:45 AM 04/18/2012

## 2012-04-22 ENCOUNTER — Telehealth: Payer: Self-pay | Admitting: *Deleted

## 2012-04-22 NOTE — Telephone Encounter (Signed)
Called patient to advise of labs.  Patient stated he had taken a statin drug before and it caused him to have muscle pain.  Patient is going to try to find the name of statin he used to take and call back next week.  Will forward to [facility] F. RN in case message gets routed to her.

## 2012-04-22 NOTE — Telephone Encounter (Signed)
Message copied by Burnell Blanks on Fri Apr 22, 2012  3:42 PM ------      Message from: Rollene Rotunda      Created: Fri Apr 22, 2012  2:26 PM       I do not see that he is on a statin.  I would like for him to be on Lipitor 40 mg unless there is a contraindication.  40 mg po daily.  Disp number 31 with 11 refills.  Repeat lipid and liver enzymes in 8 weeks.  Call Mr. Latorre with the results.

## 2012-04-25 NOTE — Telephone Encounter (Signed)
Patient states was taken some statin for high cholesterol sometime ago, but made him have muscle ache. Pt does not remember the name of the medication. Pt would like for Dr. Antoine Poche to prescribed a new medication for his high cholesterol.

## 2012-04-25 NOTE — Telephone Encounter (Signed)
New Problem:    Patient called in needing a refill of his Statin 40mg  filled at his pharmacy listed on his profile.

## 2012-05-02 ENCOUNTER — Telehealth: Payer: Self-pay | Admitting: Cardiology

## 2012-05-02 NOTE — Telephone Encounter (Signed)
Will forward to Dr Antoine Poche to review and order statin as needed

## 2012-05-02 NOTE — Telephone Encounter (Signed)
Fu call °Pt calling back again °

## 2012-05-02 NOTE — Telephone Encounter (Signed)
Justin Griffin states the Simvastatin 40mg  that he was on previously caused muscle cramps and is calling [name], Dr Hochrein's nurse back to see what he is supposed to take.

## 2012-05-02 NOTE — Telephone Encounter (Signed)
Error

## 2012-05-03 ENCOUNTER — Telehealth: Payer: Self-pay | Admitting: *Deleted

## 2012-05-03 DIAGNOSIS — E7800 Pure hypercholesterolemia, unspecified: Secondary | ICD-10-CM

## 2012-05-03 MED ORDER — ATORVASTATIN CALCIUM 40 MG PO TABS: 40.0000 mg | ORAL_TABLET | Freq: Every day | ORAL | Status: DC

## 2012-05-03 NOTE — Telephone Encounter (Signed)
Pt has been started on atorvastatin 40 mg.

## 2012-05-03 NOTE — Telephone Encounter (Signed)
Patient called back and it was Simvastatin 40 mg he took before.  Patient is willing to try the Lipitor and advised if he has any problems to call back. Will schedule follow up labs

## 2012-05-03 NOTE — Telephone Encounter (Signed)
Message copied by Burnell Blanks on Tue May 03, 2012  8:40 AM ------      Message from: Rollene Rotunda      Created: Fri Apr 22, 2012  2:26 PM       I do not see that he is on a statin.  I would like for him to be on Lipitor 40 mg unless there is a contraindication.  40 mg po daily.  Disp number 31 with 11 refills.  Repeat lipid and liver enzymes in 8 weeks.  Call Mr. Carswell with the results.

## 2012-06-28 ENCOUNTER — Other Ambulatory Visit (INDEPENDENT_AMBULATORY_CARE_PROVIDER_SITE_OTHER): Payer: Medicare HMO

## 2012-06-28 DIAGNOSIS — E7800 Pure hypercholesterolemia, unspecified: Secondary | ICD-10-CM

## 2012-06-28 LAB — HEPATIC FUNCTION PANEL
AST: 25 U/L (ref 0–37)
Albumin: 3.7 g/dL (ref 3.5–5.2)
Alkaline Phosphatase: 78 U/L (ref 39–117)
Total Protein: 7.6 g/dL (ref 6.0–8.3)

## 2012-07-07 ENCOUNTER — Encounter: Payer: Self-pay | Admitting: *Deleted

## 2013-04-16 ENCOUNTER — Other Ambulatory Visit: Payer: Self-pay | Admitting: Cardiology

## 2013-05-03 ENCOUNTER — Other Ambulatory Visit: Payer: Self-pay | Admitting: Cardiology

## 2013-05-17 ENCOUNTER — Other Ambulatory Visit: Payer: Self-pay

## 2013-05-17 MED ORDER — BENAZEPRIL-HYDROCHLOROTHIAZIDE 20-12.5 MG PO TABS: 1.0000 | ORAL_TABLET | Freq: Two times a day (BID) | ORAL | Status: DC

## 2013-05-17 NOTE — Telephone Encounter (Signed)
Patient f/u appt scheduled 07/06/13 @2 :45 w/Dr.Hochrein

## 2013-07-06 ENCOUNTER — Encounter: Payer: Self-pay | Admitting: Cardiology

## 2013-07-06 ENCOUNTER — Ambulatory Visit (INDEPENDENT_AMBULATORY_CARE_PROVIDER_SITE_OTHER): Payer: Medicare HMO | Admitting: Cardiology

## 2013-07-06 VITALS — BP 140/72 | HR 80 | Ht 72.0 in | Wt 189.0 lb

## 2013-07-06 DIAGNOSIS — I10 Essential (primary) hypertension: Secondary | ICD-10-CM

## 2013-07-06 DIAGNOSIS — Z79899 Other long term (current) drug therapy: Secondary | ICD-10-CM

## 2013-07-06 DIAGNOSIS — I2510 Atherosclerotic heart disease of native coronary artery without angina pectoris: Secondary | ICD-10-CM

## 2013-07-06 DIAGNOSIS — E7800 Pure hypercholesterolemia, unspecified: Secondary | ICD-10-CM

## 2013-07-06 NOTE — Progress Notes (Signed)
HPI The patient presents for followup of coronary disease. Since then he said no new cardiovascular problems. He still works. With this he denies any chest pressure, neck or arm discomfort. He has no palpitations, presyncope or syncope. He has no PND or orthopnea.  Of note the patient's last stress test was 2013 without any EKG abnormalities but with a short run of nonsustained V. Tach in recovery.  However, he's never noticed this and doesn't notice the PVCs that are identified on his EKG.   Allergies  Allergen Reactions  . Simvastatin     Muscle pain    Current Outpatient Prescriptions  Medication Sig Dispense Refill  . amLODipine (NORVASC) 2.5 MG tablet take 1 tablet by mouth once daily  90 tablet  3  . atorvastatin (LIPITOR) 40 MG tablet Take 1 tablet (40 mg total) by mouth daily.  31 tablet  11  . benazepril-hydrochlorthiazide (LOTENSIN HCT) 20-12.5 MG per tablet Take 1 tablet by mouth 2 (two) times daily.  60 tablet  2  . KLOR-CON M10 10 MEQ tablet take 1 tablet by mouth once daily  90 tablet  0   No current facility-administered medications for this visit.    Past Medical History  Diagnosis Date  . CAD (coronary artery disease)     Catheterization 2006 LAD occluded, OM 295% stenosis followed by mid occlusion, first obtuse marginal occluded, right coronary artery diffuse moderate disease. LIMA to the LAD was patent. Saphenous vein to PDA and posterior lateral patent with diffuse luminal irregularities, saphenous vein graft to second obtuse marginal is patent, saphenous vein graft to the first obtuse marginal had 25% stenosis.  . Other and unspecified hyperlipidemia   . Other dysphagia   . Unspecified essential hypertension   . Esophageal reflux   . Rheumatoid arthritis(714.0)     Past Surgical History  Procedure Laterality Date  . Coronary artery bypass graft  1996  . Left digit amputation      ROS:  As stated in the HPI and negative for all other systems.  PHYSICAL  EXAM BP 140/72  Pulse 80  Ht 6' (1.829 m)  Wt 189 lb (85.73 kg)  BMI 25.63 kg/m2 GENERAL:  Well appearing HEENT:  Pupils equal round and reactive, fundi not visualized, oral mucosa unremarkable NECK:  No jugular venous distention, waveform within normal limits, carotid upstroke brisk and symmetric, no bruits, no thyromegaly LYMPHATICS:  No cervical, inguinal adenopathy LUNGS:  Clear to auscultation bilaterally BACK:  No CVA tenderness CHEST:  Unremarkable HEART:  PMI not displaced or sustained,S1 and S2 within normal limits, no S3, no S4, no clicks, no rubs, no murmurs ABD:  Flat, positive bowel sounds normal in frequency in pitch, no bruits, no rebound, no guarding, no midline pulsatile mass, no hepatomegaly, no splenomegaly EXT:  2 plus pulses throughout, no edema, no cyanosis no clubbing SKIN:  No rashes no nodules NEURO:  Cranial nerves II through XII grossly intact, motor grossly intact throughout PSYCH:  Cognitively intact, oriented to person place and time   EKG:  Sinus rhythm, rate 80, leftward axis, intervals within normal limits, nonspecific inferior T-wave flattening, no acute ST-T wave changes. Premature ectopic complexes.T-wave inversions unchanged from previous.  07/06/2013   ASSESSMENT AND PLAN  CAD:  The patient has no new sypmtoms.  No further cardiovascular testing is indicated.  We will continue with aggressive risk reduction and meds as listed.  HTN:  The blood pressure is at target. No change in medications is indicated. We  will continue with therapeutic lifestyle changes (TLC).  HYPERLIPIDEMIA:  I will check a fasting lipid profile and liver enzymes.  PVCs:  He is not noticing this. No change in therapy is indicated.

## 2013-07-06 NOTE — Patient Instructions (Addendum)
The current medical regimen is effective;  continue present plan and medications.  Please return fasting for blood work. (Lipid and hepatic panel)  Follow up in 1 year with Dr Antoine Poche.  You will receive a letter in the mail 2 months before you are due.  Please call us when you receive this letter to schedule your follow up appointment.

## 2013-07-10 ENCOUNTER — Other Ambulatory Visit (INDEPENDENT_AMBULATORY_CARE_PROVIDER_SITE_OTHER): Payer: Medicare HMO

## 2013-07-10 DIAGNOSIS — E7800 Pure hypercholesterolemia, unspecified: Secondary | ICD-10-CM

## 2013-07-10 DIAGNOSIS — Z79899 Other long term (current) drug therapy: Secondary | ICD-10-CM

## 2013-07-10 LAB — LIPID PANEL
HDL: 38.1 mg/dL — ABNORMAL LOW (ref >39.00)
LDL Cholesterol: 117 mg/dL — ABNORMAL HIGH (ref 0–99)
Total CHOL/HDL Ratio: 5
Triglycerides: 127 mg/dL (ref 0.0–149.0)

## 2013-07-10 LAB — HEPATIC FUNCTION PANEL
ALT: 13 U/L (ref 0–53)
AST: 21 U/L (ref 0–37)
Albumin: 3.7 g/dL (ref 3.5–5.2)
Total Bilirubin: 0.7 mg/dL (ref 0.3–1.2)

## 2013-07-31 ENCOUNTER — Other Ambulatory Visit: Payer: Self-pay | Admitting: *Deleted

## 2013-07-31 MED ORDER — ATORVASTATIN CALCIUM 80 MG PO TABS: 80.0000 mg | ORAL_TABLET | Freq: Every day | ORAL | Status: DC

## 2013-08-15 ENCOUNTER — Other Ambulatory Visit: Payer: Self-pay | Admitting: Cardiology

## 2013-08-16 ENCOUNTER — Telehealth: Payer: Self-pay | Admitting: Internal Medicine

## 2013-08-16 NOTE — Telephone Encounter (Signed)
Pt states he was told by his "insurance" that he had some blood in his stool. Pt states it has been a long time since he had a colonoscopy. Pt scheduled to see Dr. Marina Goodell 09/20/13@9 :30am. Pt aware of appt.

## 2013-08-18 ENCOUNTER — Other Ambulatory Visit: Payer: Self-pay | Admitting: Cardiology

## 2013-08-22 ENCOUNTER — Other Ambulatory Visit: Payer: Self-pay | Admitting: *Deleted

## 2013-08-22 DIAGNOSIS — Z79899 Other long term (current) drug therapy: Secondary | ICD-10-CM

## 2013-08-22 DIAGNOSIS — E7800 Pure hypercholesterolemia, unspecified: Secondary | ICD-10-CM

## 2013-09-20 ENCOUNTER — Ambulatory Visit: Payer: Medicare HMO | Admitting: Internal Medicine

## 2013-09-20 ENCOUNTER — Other Ambulatory Visit (INDEPENDENT_AMBULATORY_CARE_PROVIDER_SITE_OTHER): Payer: Medicare HMO

## 2013-09-20 DIAGNOSIS — E7800 Pure hypercholesterolemia, unspecified: Secondary | ICD-10-CM

## 2013-09-20 DIAGNOSIS — Z79899 Other long term (current) drug therapy: Secondary | ICD-10-CM

## 2013-09-20 LAB — LIPID PANEL
Cholesterol: 103 mg/dL (ref 0–200)
HDL: 37.2 mg/dL — ABNORMAL LOW (ref >39.00)
LDL Cholesterol: 49 mg/dL (ref 0–99)
Triglycerides: 82 mg/dL (ref 0.0–149.0)
VLDL: 16.4 mg/dL (ref 0.0–40.0)

## 2013-09-20 LAB — HEPATIC FUNCTION PANEL
AST: 21 U/L (ref 0–37)
Albumin: 4.1 g/dL (ref 3.5–5.2)
Alkaline Phosphatase: 81 U/L (ref 39–117)
Total Bilirubin: 0.7 mg/dL (ref 0.3–1.2)

## 2013-09-22 ENCOUNTER — Other Ambulatory Visit: Payer: Self-pay | Admitting: Cardiology

## 2013-09-22 NOTE — Telephone Encounter (Signed)
Just want to verify that the patient is to be taking 40mg  instead of 80mg . I see that he had recent labs and wanted to ensure that I send it in correctly.  Thanks, MI

## 2013-09-22 NOTE — Telephone Encounter (Signed)
Dr Antoine Poche has not reviewed labs as of yet.  Will send in new RX if medication or dosage changes after review

## 2013-09-26 ENCOUNTER — Other Ambulatory Visit: Payer: Medicare HMO

## 2013-09-26 ENCOUNTER — Encounter: Payer: Self-pay | Admitting: Internal Medicine

## 2013-10-02 ENCOUNTER — Encounter: Payer: Self-pay | Admitting: *Deleted

## 2013-10-27 ENCOUNTER — Ambulatory Visit (INDEPENDENT_AMBULATORY_CARE_PROVIDER_SITE_OTHER): Payer: Medicare HMO | Admitting: Internal Medicine

## 2013-10-27 ENCOUNTER — Encounter: Payer: Self-pay | Admitting: Internal Medicine

## 2013-10-27 ENCOUNTER — Telehealth: Payer: Self-pay | Admitting: Internal Medicine

## 2013-10-27 VITALS — BP 148/80 | HR 80 | Ht 68.25 in | Wt 193.1 lb

## 2013-10-27 DIAGNOSIS — K222 Esophageal obstruction: Secondary | ICD-10-CM

## 2013-10-27 DIAGNOSIS — K219 Gastro-esophageal reflux disease without esophagitis: Secondary | ICD-10-CM

## 2013-10-27 DIAGNOSIS — R195 Other fecal abnormalities: Secondary | ICD-10-CM

## 2013-10-27 DIAGNOSIS — K921 Melena: Secondary | ICD-10-CM

## 2013-10-27 MED ORDER — MOVIPREP 100 G PO SOLR: 1.0000 | Freq: Once | ORAL | Status: DC

## 2013-10-27 NOTE — Progress Notes (Signed)
HISTORY OF PRESENT ILLNESS:  Justin Griffin is a 73 y.o. male who is sent here regarding Hemoccult-positive stool and the need for colonoscopy. He apparently had an evaluation with his insurance company which included occult blood testing of the stool. She tells me this returned positive. He does not have that result nor can he tell me how I might be able to obtain that result. In any event, he has not had colonoscopy since 1997. Being evaluated for rectal bleeding at that time. Hyperplastic polyp only. 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 Currently taking over-the-counter PPI with good control of reflux symptoms and no significant dysphagia. Review of outside blood work from October 2014 shows normal liver tests and normal cholesterol.  REVIEW OF SYSTEMS:  All non-GI ROS negative except for arthritis  Past Medical History  Diagnosis Date  . CAD (coronary artery disease)     Catheterization 2006 LAD occluded, OM 295% stenosis followed by mid occlusion, first obtuse marginal occluded, right coronary artery diffuse moderate disease. LIMA to the LAD was patent. Saphenous vein to PDA and posterior lateral patent with diffuse luminal irregularities, saphenous vein graft to second obtuse marginal is patent, saphenous vein graft to the first obtuse marginal had 25% stenosis.  . Other and unspecified hyperlipidemia   . Other dysphagia   . Unspecified essential hypertension   . Esophageal reflux   . Rheumatoid arthritis(714.0)   . Esophageal stricture   . Status post dilation of esophageal narrowing     Past Surgical History  Procedure Laterality Date  . Coronary artery bypass graft  1996  . Left digit amputation      Social History ORMAND SENN  reports that he quit smoking about 19 years ago. His smoking use included Cigarettes. He smoked 0.00 packs per day. He has never used smokeless tobacco. He  reports that he does not drink alcohol or use illicit drugs.  family history includes Heart disease in his maternal grandfather and mother; Prostate cancer in his paternal grandfather.  Allergies  Allergen Reactions  . Zocor [Simvastatin]     Muscle pain       PHYSICAL EXAMINATION: Vital signs: BP 148/80  Pulse 80  Ht 5' 8.25" (1.734 m)  Wt 193 lb 2 oz (87.601 kg)  BMI 29.13 kg/m2  Constitutional: generally well-appearing, no acute distress Psychiatric: alert and oriented x3, cooperative Eyes: extraocular movements intact, anicteric, conjunctiva pink Mouth: oral pharynx moist, no lesions Neck: supple no lymphadenopathy Cardiovascular: heart regular rate and rhythm, no murmur Lungs: clear to auscultation bilaterally Abdomen: soft, nontender, nondistended, no obvious ascites, no peritoneal signs, normal bowel sounds, no organomegaly Rectal: Deferred until colonoscopy Extremities: no lower extremity edema bilaterally Skin: no lesions on visible extremities Neuro: No focal deficits. No asterixis.    ASSESSMENT:  #1. Hemoccult-positive stool #2. Last colonoscopy 1997. Hyperplastic polyp. Due for followup #3. GERD complicated by peptic stricture. Currently asymptomatic post dilation on PPI. Last EGD with dilation November 2009   PLAN:  #1. Reflux precautions #2. Continue PPI #3. Schedule colonoscopy to provide neoplasia screening and to evaluate heme positive stool.The nature of the procedure, as well as the risks, benefits, and alternatives were carefully and thoroughly reviewed with the patient. Ample time for discussion and questions allowed. The patient understood, was satisfied, and agreed to proceed. Movi prep prescribed. The patient instructed on its use.

## 2013-10-27 NOTE — Patient Instructions (Signed)

## 2013-10-27 NOTE — Telephone Encounter (Signed)
I have never seen this patient according to [facility] records!!! If that doesn't matter - ok for referral (entered)

## 2013-10-27 NOTE — Telephone Encounter (Signed)
Per [facility] pt need an referral to see Dr Henrene Pastor for blood in stool,  pt being seen today just need to submit the referral to [place] for authorization thank you

## 2013-11-08 ENCOUNTER — Encounter: Payer: Self-pay | Admitting: Internal Medicine

## 2013-11-08 ENCOUNTER — Ambulatory Visit (AMBULATORY_SURGERY_CENTER): Payer: Medicare HMO | Admitting: Internal Medicine

## 2013-11-08 VITALS — BP 128/76 | HR 59 | Temp 98.5°F | Resp 15 | Ht 68.0 in | Wt 193.0 lb

## 2013-11-08 DIAGNOSIS — K921 Melena: Secondary | ICD-10-CM

## 2013-11-08 DIAGNOSIS — Z1211 Encounter for screening for malignant neoplasm of colon: Secondary | ICD-10-CM

## 2013-11-08 MED ORDER — SODIUM CHLORIDE 0.9 % IV SOLN: 500.0000 mL | INTRAVENOUS | Status: DC

## 2013-11-08 NOTE — Op Note (Signed)
[place]  [employer]. [place], [address]   COLONOSCOPY PROCEDURE REPORT  PATIENT: Justin Griffin, Justin Griffin  MR#: [id number] BIRTHDATE: 1941/08/16 , 72  yrs. old GENDER: Male ENDOSCOPIST: Eustace Quail, MD REFERRED BY:.  Self / Office PROCEDURE DATE:  11/08/2013 PROCEDURE:   Colonoscopy, diagnostic First Screening Colonoscopy - Avg.  risk and is 50 yrs.  old or older - No.  Prior Negative Screening - Now for repeat screening. N/A  History of Adenoma - Now for follow-up colonoscopy & has been > or = to 3 yrs.  N/A  Polyps Removed Today? No.  Recommend repeat exam, <10 yrs? No. ASA CLASS:   Class II INDICATIONS:heme-positive stool (insurance exam) .   Prior exam 1997 with Hpp only MEDICATIONS: MAC sedation, administered by CRNA and propofol (Diprivan) 250mg  IV  DESCRIPTION OF PROCEDURE:   After the risks benefits and alternatives of the procedure were thoroughly explained, informed consent was obtained.  A digital rectal exam revealed no abnormalities of the rectum.   The LB [id number]  endoscope was introduced through the anus and advanced to the cecum, which was identified by both the appendix and ileocecal valve. No adverse events experienced.   The quality of the prep was excellent, using MoviPrep  The instrument was then slowly withdrawn as the colon was fully examined.      COLON FINDINGS: A normal appearing cecum, ileocecal valve, and appendiceal orifice were identified.  The ascending, hepatic flexure, transverse, splenic flexure, descending, sigmoid colon and rectum appeared unremarkable.  No polyps or cancers were seen. Retroflexed views revealed internal hemorrhoids. The time to cecum=4 minutes 28 seconds.  Withdrawal time=13 minutes 02 seconds. The scope was withdrawn and the procedure completed.  COMPLICATIONS: There were no complications.  ENDOSCOPIC IMPRESSION: 1. Normal colon  RECOMMENDATIONS: 1. Return to the care of your primary  provider.  GI follow up as needed   eSigned:  Eustace Quail, MD 11/08/2013 12:57 PM   cc: The Patient and Neena Rhymes, MD

## 2013-11-08 NOTE — Progress Notes (Signed)
Report to pacu rn, vss, bbs=clear 

## 2013-11-08 NOTE — Patient Instructions (Signed)
Normal colon exam today. Resume current medications. Call us if you have any questions or concerns. Thank you!!  YOU HAD AN ENDOSCOPIC PROCEDURE TODAY AT [facility]: Refer to the procedure report that was given to you for any specific questions about what was found during the examination.  If the procedure report does not answer your questions, please call your gastroenterologist to clarify.  If you requested that your care partner not be given the details of your procedure findings, then the procedure report has been included in a sealed envelope for you to review at your convenience later.  YOU SHOULD EXPECT: Some feelings of bloating in the abdomen. Passage of more gas than usual.  Walking can help get rid of the air that was put into your GI tract during the procedure and reduce the bloating. If you had a lower endoscopy (such as a colonoscopy or flexible sigmoidoscopy) you may notice spotting of blood in your stool or on the toilet paper. If you underwent a bowel prep for your procedure, then you may not have a normal bowel movement for a few days.  DIET: Your first meal following the procedure should be a light meal and then it is ok to progress to your normal diet.  A half-sandwich or bowl of soup is an example of a good first meal.  Heavy or fried foods are harder to digest and may make you feel nauseous or bloated.  Likewise meals heavy in dairy and vegetables can cause extra gas to form and this can also increase the bloating.  Drink plenty of fluids but you should avoid alcoholic beverages for 24 hours.  ACTIVITY: Your care partner should take you home directly after the procedure.  You should plan to take it easy, moving slowly for the rest of the day.  You can resume normal activity the day after the procedure however you should NOT DRIVE or use heavy machinery for 24 hours (because of the sedation medicines used during the test).    SYMPTOMS TO REPORT IMMEDIATELY: A  gastroenterologist can be reached at any hour.  During normal business hours, 8:30 AM to 5:00 PM Monday through Friday, call [phone].  After hours and on weekends, please call the GI answering service at [phone] who will take a message and have the physician on call contact you.   Following lower endoscopy (colonoscopy or flexible sigmoidoscopy):  Excessive amounts of blood in the stool  Significant tenderness or worsening of abdominal pains  Swelling of the abdomen that is new, acute  Fever of 100F or higher  Following upper endoscopy (EGD)  Vomiting of blood or coffee ground material  New chest pain or pain under the shoulder blades  Painful or persistently difficult swallowing  New shortness of breath  Fever of 100F or higher  Black, tarry-looking stools  FOLLOW UP: If any biopsies were taken you will be contacted by phone or by letter within the next 1-3 weeks.  Call your gastroenterologist if you have not heard about the biopsies in 3 weeks.  Our staff will call the home number listed on your records the next business day following your procedure to check on you and address any questions or concerns that you may have at that time regarding the information given to you following your procedure. This is a courtesy call and so if there is no answer at the home number and we have not heard from you through the emergency physician on call, we  will assume that you have returned to your regular daily activities without incident.  SIGNATURES/CONFIDENTIALITY: You and/or your care partner have signed paperwork which will be entered into your electronic medical record.  These signatures attest to the fact that that the information above on your After Visit Summary has been reviewed and is understood.  Full responsibility of the confidentiality of this discharge information lies with you and/or your care-partner.

## 2013-11-09 ENCOUNTER — Telehealth: Payer: Self-pay

## 2013-11-09 NOTE — Telephone Encounter (Signed)
  Follow up Call-  Call back number 11/08/2013  Post procedure Call Back phone  # [phone]  Permission to leave phone message Yes     Patient questions:  Do you have a fever, pain , or abdominal swelling? no Pain Score  0 *  Have you tolerated food without any problems? yes  Have you been able to return to your normal activities? yes  Do you have any questions about your discharge instructions: Diet   no Medications  no Follow up visit  no  Do you have questions or concerns about your Care? no  Actions: * If pain score is 4 or above: No action needed, pain <4.

## 2014-04-10 ENCOUNTER — Other Ambulatory Visit: Payer: Self-pay | Admitting: Cardiology

## 2014-05-09 ENCOUNTER — Other Ambulatory Visit: Payer: Self-pay | Admitting: Cardiology

## 2014-08-21 ENCOUNTER — Other Ambulatory Visit: Payer: Self-pay | Admitting: Cardiology

## 2014-10-03 ENCOUNTER — Other Ambulatory Visit: Payer: Self-pay | Admitting: Cardiology

## 2014-10-05 ENCOUNTER — Other Ambulatory Visit: Payer: Self-pay | Admitting: Cardiology

## 2014-10-12 ENCOUNTER — Ambulatory Visit (INDEPENDENT_AMBULATORY_CARE_PROVIDER_SITE_OTHER): Payer: Medicare HMO | Admitting: Cardiology

## 2014-10-12 ENCOUNTER — Encounter: Payer: Self-pay | Admitting: Cardiology

## 2014-10-12 VITALS — BP 130/72 | HR 72 | Ht 71.0 in | Wt 193.8 lb

## 2014-10-12 DIAGNOSIS — I10 Essential (primary) hypertension: Secondary | ICD-10-CM

## 2014-10-12 DIAGNOSIS — Z951 Presence of aortocoronary bypass graft: Secondary | ICD-10-CM

## 2014-10-12 MED ORDER — TADALAFIL 20 MG PO TABS: 20.0000 mg | ORAL_TABLET | Freq: Every day | ORAL | Status: DC | PRN

## 2014-10-12 NOTE — Progress Notes (Signed)
   HPI The patient presents for followup of coronary disease. Since then he said no new cardiovascular problems. He denies any chest pressure, neck or arm discomfort. He has no palpitations, presyncope or syncope. He has no PND or orthopnea.  He stays very active.     Allergies  Allergen Reactions  . Zocor [Simvastatin]     Muscle pain    Current Outpatient Prescriptions  Medication Sig Dispense Refill  . amLODipine (NORVASC) 2.5 MG tablet take 1 tablet by mouth once daily 30 tablet 0  . aspirin 325 MG tablet Take 325 mg by mouth daily.    Marland Kitchen atorvastatin (LIPITOR) 80 MG tablet take 1 tablet by mouth once daily 30 tablet 1  . benazepril-hydrochlorthiazide (LOTENSIN HCT) 20-12.5 MG per tablet take 1 tablet by mouth twice a day 60 tablet 0  . potassium chloride (K-DUR,KLOR-CON) 10 MEQ tablet take 1 tablet by mouth once daily 90 tablet 2   No current facility-administered medications for this visit.    Past Medical History  Diagnosis Date  . CAD (coronary artery disease)     Catheterization 2006 LAD occluded, OM 95% stenosis followed by mid occlusion, first obtuse marginal occluded, right coronary artery diffuse moderate disease. LIMA to the LAD was patent. Saphenous vein to PDA and posterior lateral patent with diffuse luminal irregularities, saphenous vein graft to second obtuse marginal is patent, saphenous vein graft to the first obtuse marginal had 25% stenosis.  . Other and unspecified hyperlipidemia   . Other dysphagia   . Unspecified essential hypertension   . Esophageal reflux   . Rheumatoid arthritis(714.0)   . Esophageal stricture   . Status post dilation of esophageal narrowing     Past Surgical History  Procedure Laterality Date  . Coronary artery bypass graft  1996  . Left digit amputation      ROS:  As stated in the HPI and negative for all other systems.  PHYSICAL EXAM BP 130/72 mmHg  Pulse 72  Ht 5\' 11"  (1.803 m)  Wt 193 lb 12.8 oz (87.907 kg)  BMI 27.04  kg/m2 GENERAL:  Well appearing NECK:  No jugular venous distention, waveform within normal limits, carotid upstroke brisk and symmetric, no bruits, no thyromegaly LUNGS:  Clear to auscultation bilaterally BACK:  No CVA tenderness CHEST:  Unremarkable HEART:  PMI not displaced or sustained,S1 and S2 within normal limits, no S3, no S4, no clicks, no rubs, no murmurs ABD:  Flat, positive bowel sounds normal in frequency in pitch, no bruits, no rebound, no guarding, no midline pulsatile mass, no hepatomegaly, no splenomegaly EXT:  2 plus pulses throughout, no edema, no cyanosis no clubbing   EKG:  Sinus rhythm, rate 72, leftward axis, intervals within normal limits, nonspecific inferior T-wave flattening, no acute ST-T wave changes. Premature ectopic complexes.T-wave inversions unchanged from previous.  10/12/2014   ASSESSMENT AND PLAN  CAD:  I will bring the patient back for a POET (Plain Old Exercise Test). This will allow me to screen for obstructive coronary disease, risk stratify and very importantly provide a prescription for exercise.  HTN:  The blood pressure is at target. No change in medications is indicated. We will continue with therapeutic lifestyle changes (TLC).  HYPERLIPIDEMIA:  I will check a fasting lipid profile and liver enzymes when he comes back for his POET (Plain Old Exercise Treadmill)

## 2014-10-12 NOTE — Patient Instructions (Signed)
Your physician recommends that you schedule a follow-up appointment in: one year with Dr. Hochrein  

## 2014-10-19 ENCOUNTER — Telehealth: Payer: Self-pay | Admitting: *Deleted

## 2014-10-19 NOTE — Telephone Encounter (Signed)
Patient walked in and filled out yellow sheet requesting cialis refill. Informed him this was refilled on 10/12/14 #10 with 6 refills. He voiced understanding

## 2014-11-12 ENCOUNTER — Other Ambulatory Visit: Payer: Self-pay | Admitting: Cardiology

## 2014-12-03 ENCOUNTER — Other Ambulatory Visit: Payer: Self-pay | Admitting: Cardiology

## 2014-12-20 ENCOUNTER — Other Ambulatory Visit: Payer: Self-pay | Admitting: Cardiology

## 2015-02-01 ENCOUNTER — Other Ambulatory Visit: Payer: Self-pay | Admitting: Cardiology

## 2015-05-07 ENCOUNTER — Encounter: Payer: Self-pay | Admitting: Internal Medicine

## 2015-07-01 ENCOUNTER — Emergency Department (HOSPITAL_COMMUNITY)
Admission: EM | Admit: 2015-07-01 | Discharge: 2015-07-01 | Disposition: A | Discharge status: Home / Self Care | Payer: Medicare HMO | Source: Home / Self Care | Attending: Family Medicine | Admitting: Family Medicine

## 2015-07-01 ENCOUNTER — Encounter (HOSPITAL_COMMUNITY): Payer: Self-pay | Admitting: *Deleted

## 2015-07-01 DIAGNOSIS — J301 Allergic rhinitis due to pollen: Secondary | ICD-10-CM | POA: Diagnosis not present

## 2015-07-01 DIAGNOSIS — R0982 Postnasal drip: Secondary | ICD-10-CM | POA: Diagnosis not present

## 2015-07-01 NOTE — ED Provider Notes (Signed)
CSN: [id number]     Arrival date & time 07/01/15  1312 History   First MD Initiated Contact with Patient 07/01/15 1542     Chief Complaint  Patient presents with  . Sore Throat   (Consider location/radiation/quality/duration/timing/severity/associated sxs/prior Treatment) HPI Comments: 74 year old male presents with a sore throat for 3 days. Is also complaining of much PND and having to clear his throat. He also has a history of acid reflux. Denies earache, fever, chest pain or shortness of breath.  Patient is a 74 y.o. male presenting with pharyngitis. The history is provided by the patient.  Sore Throat This is a new problem. The current episode started more than 2 days ago. The problem occurs constantly. The problem has not changed since onset.Pertinent negatives include no chest pain, no abdominal pain, no headaches and no shortness of breath. Nothing aggravates the symptoms. The symptoms are relieved by lying down.    Past Medical History  Diagnosis Date  . CAD (coronary artery disease)     Catheterization 2006 LAD occluded, OM 95% stenosis followed by mid occlusion, first obtuse marginal occluded, right coronary artery diffuse moderate disease. LIMA to the LAD was patent. Saphenous vein to PDA and posterior lateral patent with diffuse luminal irregularities, saphenous vein graft to second obtuse marginal is patent, saphenous vein graft to the first obtuse marginal had 25% stenosis.  . Other and unspecified hyperlipidemia   . Other dysphagia   . Unspecified essential hypertension   . Esophageal reflux   . Rheumatoid arthritis(714.0)   . Esophageal stricture   . Status post dilation of esophageal narrowing    Past Surgical History  Procedure Laterality Date  . Coronary artery bypass graft  1996  . Left digit amputation     Family History  Problem Relation Age of Onset  . Prostate cancer Paternal Grandfather   . Heart disease Maternal Grandfather   . Heart disease Mother     Social History  Substance Use Topics  . Smoking status: Former Smoker    Types: Cigarettes    Quit date: 10/05/1994  . Smokeless tobacco: Never Used  . Alcohol Use: No    Review of Systems  Constitutional: Negative for fever, diaphoresis, activity change and fatigue.  HENT: Positive for postnasal drip and sore throat. Negative for ear pain, facial swelling, rhinorrhea and trouble swallowing.   Eyes: Negative for pain, discharge and redness.  Respiratory: Positive for cough. Negative for chest tightness and shortness of breath.   Cardiovascular: Negative.  Negative for chest pain.  Gastrointestinal: Negative.  Negative for abdominal pain.  Musculoskeletal: Negative.  Negative for neck pain and neck stiffness.  Neurological: Negative.  Negative for headaches.    Allergies  Zocor  Home Medications   Prior to Admission medications   Medication Sig Start Date End Date Taking? Authorizing Provider  amLODipine (NORVASC) 2.5 MG tablet take 1 tablet by mouth once daily 10/08/14   Minus Breeding, MD  amLODipine (NORVASC) 2.5 MG tablet take 1 tablet by mouth once daily 11/13/14   Minus Breeding, MD  aspirin 325 MG tablet Take 325 mg by mouth daily.    Historical Provider, MD  atorvastatin (LIPITOR) 80 MG tablet take 1 tablet by mouth once daily 12/04/14   Minus Breeding, MD  benazepril-hydrochlorthiazide (LOTENSIN HCT) 20-12.5 MG per tablet take 1 tablet by mouth twice a day 12/21/14   Minus Breeding, MD  potassium chloride (K-DUR,KLOR-CON) 10 MEQ tablet take 1 tablet by mouth once daily 02/01/15   Minus Breeding,  MD  tadalafil (CIALIS) 20 MG tablet Take 1 tablet (20 mg total) by mouth daily as needed for erectile dysfunction. 10/12/14   Minus Breeding, MD   Meds Ordered and Administered this Visit  Medications - No data to display  BP 154/90 mmHg  Pulse 85  Temp(Src) 98.6 F (37 C) (Oral)  Resp 18  SpO2 95% No data found.   Physical Exam  Constitutional: He appears well-developed and  well-nourished. No distress.  HENT:  Mouth/Throat: No oropharyngeal exudate.  Bilateral TMs are normal Oropharynx with minor erythema, cobblestoning and clear PND.  Eyes: Conjunctivae and EOM are normal.  Neck: Normal range of motion. Neck supple.  Cardiovascular: Normal rate, regular rhythm and normal heart sounds.   Pulmonary/Chest: Effort normal and breath sounds normal. No respiratory distress. He has no wheezes. He has no rales.  Musculoskeletal: Normal range of motion.  Lymphadenopathy:    He has no cervical adenopathy.  Neurological: He is alert. He exhibits normal muscle tone.  Skin: Skin is warm and dry.  Psychiatric: He has a normal mood and affect.  Nursing note and vitals reviewed.   ED Course  Procedures (including critical care time)  Labs Review Labs Reviewed - No data to display  Imaging Review No results found.   Visual Acuity Review  Right Eye Distance:   Left Eye Distance:   Bilateral Distance:    Right Eye Near:   Left Eye Near:    Bilateral Near:         MDM   1. Allergic rhinitis due to pollen   2. PND (post-nasal drip)    Allegra or Claritin or Zyrtec as needed for drainage AT night if needed Benadryl 25 mg may help Cepacol lozenges Tylenol as needed Lots of fluids     Janne Napoleon, NP 07/01/15 1604

## 2015-07-01 NOTE — Discharge Instructions (Signed)
Allergic Rhinitis Allegra or Claritin or Zyrtec as needed for drainage AT night if needed Benadryl 25 mg may help Cepacol lozenges Tylenol as needed Lots of fluids Allergic rhinitis is when the mucous membranes in the nose respond to allergens. Allergens are particles in the air that cause your body to have an allergic reaction. This causes you to release allergic antibodies. Through a chain of events, these eventually cause you to release histamine into the blood stream. Although meant to protect the body, it is this release of histamine that causes your discomfort, such as frequent sneezing, congestion, and an itchy, runny nose.  CAUSES  Seasonal allergic rhinitis (hay fever) is caused by pollen allergens that may come from grasses, trees, and weeds. Year-round allergic rhinitis (perennial allergic rhinitis) is caused by allergens such as house dust mites, pet dander, and mold spores.  SYMPTOMS   Nasal stuffiness (congestion).  Itchy, runny nose with sneezing and tearing of the eyes. DIAGNOSIS  Your health care provider can help you determine the allergen or allergens that trigger your symptoms. If you and your health care provider are unable to determine the allergen, skin or blood testing may be used. TREATMENT  Allergic rhinitis does not have a cure, but it can be controlled by:  Medicines and allergy shots (immunotherapy).  Avoiding the allergen. Hay fever may often be treated with antihistamines in pill or nasal spray forms. Antihistamines block the effects of histamine. There are over-the-counter medicines that may help with nasal congestion and swelling around the eyes. Check with your health care provider before taking or giving this medicine.  If avoiding the allergen or the medicine prescribed do not work, there are many new medicines your health care provider can prescribe. Stronger medicine may be used if initial measures are ineffective. Desensitizing injections can be used if  medicine and avoidance does not work. Desensitization is when a patient is given ongoing shots until the body becomes less sensitive to the allergen. Make sure you follow up with your health care provider if problems continue. HOME CARE INSTRUCTIONS It is not possible to completely avoid allergens, but you can reduce your symptoms by taking steps to limit your exposure to them. It helps to know exactly what you are allergic to so that you can avoid your specific triggers. SEEK MEDICAL CARE IF:   You have a fever.  You develop a cough that does not stop easily (persistent).  You have shortness of breath.  You start wheezing.  Symptoms interfere with normal daily activities. Document Released: 06/16/2001 Document Revised: 09/26/2013 Document Reviewed: 05/29/2013 [facility] Patient Information 2015 [place], [place]. This information is not intended to replace advice given to you by your health care provider. Make sure you discuss any questions you have with your health care provider.

## 2015-07-01 NOTE — ED Notes (Signed)
Pt    Reports         sorethroat   X   3  daya    Hurts  To  Swallow  -  Pt  States   He  Has  Had the  Symptoms  X  3  Days

## 2015-07-09 ENCOUNTER — Other Ambulatory Visit: Payer: Self-pay

## 2015-07-09 ENCOUNTER — Emergency Department (HOSPITAL_COMMUNITY)
Admission: EM | Admit: 2015-07-09 | Discharge: 2015-07-09 | Disposition: A | Discharge status: Home / Self Care | Payer: Medicare HMO | Attending: Emergency Medicine | Admitting: Emergency Medicine

## 2015-07-09 ENCOUNTER — Emergency Department (HOSPITAL_COMMUNITY): Payer: Medicare HMO

## 2015-07-09 ENCOUNTER — Encounter (HOSPITAL_COMMUNITY): Payer: Self-pay | Admitting: Emergency Medicine

## 2015-07-09 DIAGNOSIS — Z87891 Personal history of nicotine dependence: Secondary | ICD-10-CM | POA: Insufficient documentation

## 2015-07-09 DIAGNOSIS — S68127A Partial traumatic metacarpophalangeal amputation of left little finger, initial encounter: Secondary | ICD-10-CM | POA: Diagnosis not present

## 2015-07-09 DIAGNOSIS — I2510 Atherosclerotic heart disease of native coronary artery without angina pectoris: Secondary | ICD-10-CM | POA: Insufficient documentation

## 2015-07-09 DIAGNOSIS — Y9289 Other specified places as the place of occurrence of the external cause: Secondary | ICD-10-CM | POA: Insufficient documentation

## 2015-07-09 DIAGNOSIS — K219 Gastro-esophageal reflux disease without esophagitis: Secondary | ICD-10-CM | POA: Insufficient documentation

## 2015-07-09 DIAGNOSIS — Y939 Activity, unspecified: Secondary | ICD-10-CM | POA: Insufficient documentation

## 2015-07-09 DIAGNOSIS — I10 Essential (primary) hypertension: Secondary | ICD-10-CM | POA: Diagnosis not present

## 2015-07-09 DIAGNOSIS — E785 Hyperlipidemia, unspecified: Secondary | ICD-10-CM | POA: Insufficient documentation

## 2015-07-09 DIAGNOSIS — Z7982 Long term (current) use of aspirin: Secondary | ICD-10-CM | POA: Insufficient documentation

## 2015-07-09 DIAGNOSIS — Y998 Other external cause status: Secondary | ICD-10-CM | POA: Insufficient documentation

## 2015-07-09 DIAGNOSIS — R079 Chest pain, unspecified: Secondary | ICD-10-CM | POA: Diagnosis present

## 2015-07-09 DIAGNOSIS — X58XXXA Exposure to other specified factors, initial encounter: Secondary | ICD-10-CM | POA: Insufficient documentation

## 2015-07-09 DIAGNOSIS — R1310 Dysphagia, unspecified: Secondary | ICD-10-CM | POA: Insufficient documentation

## 2015-07-09 LAB — BASIC METABOLIC PANEL
Anion gap: 10 (ref 5–15)
BUN: 22 mg/dL — AB (ref 6–20)
CO2: 27 mmol/L (ref 22–32)
CREATININE: 1.1 mg/dL (ref 0.61–1.24)
Calcium: 8.7 mg/dL — ABNORMAL LOW (ref 8.9–10.3)
Chloride: 97 mmol/L — ABNORMAL LOW (ref 101–111)
GFR calc Af Amer: >60 mL/min (ref >60)
GFR calc non Af Amer: >60 mL/min (ref >60)
GLUCOSE: 133 mg/dL — AB (ref 65–99)
Potassium: 3.2 mmol/L — ABNORMAL LOW (ref 3.5–5.1)
SODIUM: 134 mmol/L — AB (ref 135–145)

## 2015-07-09 LAB — CBC
HEMATOCRIT: 39.7 % (ref 39.0–52.0)
Hemoglobin: 12.7 g/dL — ABNORMAL LOW (ref 13.0–17.0)
MCH: 23.7 pg — AB (ref 26.0–34.0)
MCHC: 32 g/dL (ref 30.0–36.0)
MCV: 74.2 fL — ABNORMAL LOW (ref 78.0–100.0)
Platelets: 241 10*3/uL (ref 150–400)
RBC: 5.35 MIL/uL (ref 4.22–5.81)
RDW: 14.5 % (ref 11.5–15.5)
WBC: 8 10*3/uL (ref 4.0–10.5)

## 2015-07-09 LAB — TROPONIN I

## 2015-07-09 LAB — I-STAT TROPONIN, ED
Troponin i, poc: 0.03 ng/mL (ref 0.00–0.08)
Troponin i, poc: 0 ng/mL (ref 0.00–0.08)

## 2015-07-09 MED ORDER — POTASSIUM CHLORIDE CRYS ER 20 MEQ PO TBCR
40.0000 meq | EXTENDED_RELEASE_TABLET | Freq: Once | ORAL | Status: AC
  Administered 2015-07-09: 40 meq via ORAL
  Filled 2015-07-09: qty 2

## 2015-07-09 MED ORDER — GI COCKTAIL ~~LOC~~
30.0000 mL | Freq: Once | ORAL | Status: AC
Start: 2015-07-09 — End: 2015-07-09
  Administered 2015-07-09: 30 mL via ORAL
  Filled 2015-07-09: qty 30

## 2015-07-09 MED ORDER — ASPIRIN 81 MG PO CHEW
324.0000 mg | CHEWABLE_TABLET | Freq: Once | ORAL | Status: DC
Start: 2015-07-09 — End: 2015-07-09
  Filled 2015-07-09: qty 4

## 2015-07-09 NOTE — ED Notes (Signed)
Patient transported to X-ray 

## 2015-07-09 NOTE — Discharge Instructions (Signed)

## 2015-07-09 NOTE — ED Notes (Signed)
Pt arrives via POV from home with substernal chest pain and tightness for the last week. Pt reports the problem is worse after eating. Seen by PCP 1 week ago and no improvment with OTC claritin.  Denies SOB, dizziness, radiation of pain. Pt awake, alert, oriented x4, VSS at present.

## 2015-07-09 NOTE — ED Provider Notes (Signed)
CSN: [id number]     Arrival date & time 07/09/15  2423 History   First MD Initiated Contact with Patient 07/09/15 [phone]     Chief Complaint  Patient presents with  . Chest Pain     (Consider location/radiation/quality/duration/timing/severity/associated sxs/prior Treatment) HPI Comments: 74yo M w/ PMH including CAD s/p CABG, HTN, HLD, GERD, esophageal stricture who p/w chest tightness. The patient states that for the past one week, he has had constant chest tightness which gets worse after he eats anything. He feels like food gets stuck at the end of his esophagus. Currently he is comfortable and denies any pain. He saw his PCP 1 week ago and was started on Claritin with no relief. He denies any association of the tightness with exertion. No associated shortness of breath, diaphoresis, nausea, or vomiting. No abdominal pain. No fevers or recent illness.  Patient is a 74 y.o. male presenting with chest pain. The history is provided by the patient.  Chest Pain   Past Medical History  Diagnosis Date  . CAD (coronary artery disease)     Catheterization 2006 LAD occluded, OM 95% stenosis followed by mid occlusion, first obtuse marginal occluded, right coronary artery diffuse moderate disease. LIMA to the LAD was patent. Saphenous vein to PDA and posterior lateral patent with diffuse luminal irregularities, saphenous vein graft to second obtuse marginal is patent, saphenous vein graft to the first obtuse marginal had 25% stenosis.  . Other and unspecified hyperlipidemia   . Other dysphagia   . Unspecified essential hypertension   . Esophageal reflux   . Rheumatoid arthritis(714.0)   . Esophageal stricture   . Status post dilation of esophageal narrowing    Past Surgical History  Procedure Laterality Date  . Coronary artery bypass graft  1996  . Left digit amputation     Family History  Problem Relation Age of Onset  . Prostate cancer Paternal Grandfather   . Heart disease Maternal  Grandfather   . Heart disease Mother    Social History  Substance Use Topics  . Smoking status: Former Smoker    Types: Cigarettes    Quit date: 10/05/1994  . Smokeless tobacco: Never Used  . Alcohol Use: No    Review of Systems  Cardiovascular: Positive for chest pain.    10 Systems reviewed and are negative for acute change except as noted in the HPI.   Allergies  Zocor  Home Medications   Prior to Admission medications   Medication Sig Start Date End Date Taking? Authorizing Provider  amLODipine (NORVASC) 2.5 MG tablet take 1 tablet by mouth once daily 10/08/14  Yes Minus Breeding, MD  aspirin 325 MG tablet Take 325 mg by mouth daily.   Yes Historical Provider, MD  atorvastatin (LIPITOR) 80 MG tablet take 1 tablet by mouth once daily 12/04/14  Yes Minus Breeding, MD  benazepril-hydrochlorthiazide (LOTENSIN HCT) 20-12.5 MG per tablet take 1 tablet by mouth twice a day 12/21/14  Yes Minus Breeding, MD  potassium chloride (K-DUR,KLOR-CON) 10 MEQ tablet take 1 tablet by mouth once daily 02/01/15  Yes Minus Breeding, MD  amLODipine (NORVASC) 2.5 MG tablet take 1 tablet by mouth once daily Patient not taking: Reported on 07/09/2015 11/13/14   Minus Breeding, MD  tadalafil (CIALIS) 20 MG tablet Take 1 tablet (20 mg total) by mouth daily as needed for erectile dysfunction. Patient not taking: Reported on 07/09/2015 10/12/14   Minus Breeding, MD   BP 110/66 mmHg  Pulse 70  Temp(Src) 98.1 F (  36.7 C) (Oral)  Resp 14  SpO2 94% Physical Exam  Constitutional: He is oriented to person, place, and time. He appears well-developed and well-nourished. No distress.  HENT:  Head: Normocephalic and atraumatic.  Moist mucous membranes  Eyes: Conjunctivae are normal. Pupils are equal, round, and reactive to light.  Neck: Neck supple.  Cardiovascular: Normal rate, regular rhythm and normal heart sounds.   No murmur heard. Pulmonary/Chest: Effort normal and breath sounds normal.  Abdominal: Soft.  Bowel sounds are normal. He exhibits no distension. There is no tenderness.  Musculoskeletal: He exhibits no edema.  L 5th finger partial amputation  Neurological: He is alert and oriented to person, place, and time.  Fluent speech  Skin: Skin is warm and dry.  Psychiatric: He has a normal mood and affect. Judgment normal.  Nursing note and vitals reviewed.   ED Course  Procedures (including critical care time) Labs Review Labs Reviewed  BASIC METABOLIC PANEL - Abnormal; Notable for the following:    Sodium 134 (*)    Potassium 3.2 (*)    Chloride 97 (*)    Glucose, Bld 133 (*)    BUN 22 (*)    Calcium 8.7 (*)    All other components within normal limits  CBC - Abnormal; Notable for the following:    Hemoglobin 12.7 (*)    MCV 74.2 (*)    MCH 23.7 (*)    All other components within normal limits  TROPONIN I  I-STAT TROPOININ, ED  I-STAT TROPOININ, ED    Imaging Review Dg Chest 2 View  07/09/2015   CLINICAL DATA:  Substernal chest pain and tightness for 1 week  EXAM: CHEST  2 VIEW  COMPARISON:  January 27, 2005  FINDINGS: The lungs are clear. Heart size and pulmonary vascularity are normal. No adenopathy. Patient is status post coronary artery and internal mammary bypass grafting. There is atherosclerotic calcification in aorta. There is mild degenerative change in the thoracic spine.  IMPRESSION: No edema or consolidation.   Electronically Signed   By: Lowella Grip III M.D.   On: 07/09/2015 09:19   I have personally reviewed and evaluated these lab results as part of my medical decision-making.   EKG Interpretation   Date/Time:  Tuesday July 09 2015 08:44:59 EDT Ventricular Rate:  97 PR Interval:  192 QRS Duration: 93 QT Interval:  462 QTC Calculation: 587 R Axis:   -64 Text Interpretation:  Sinus rhythm Multiple premature complexes, vent   Left anterior fascicular block Prolonged QT interval Confirmed by LITTLE  MD, RACHEL ([id number]) on 07/09/2015 9:05:31 AM      Medications  aspirin chewable tablet 324 mg (324 mg Oral Not Given 07/09/15 0930)  gi cocktail (Maalox,Lidocaine,Donnatal) (30 mLs Oral Given 07/09/15 1041)  potassium chloride SA (K-DUR,KLOR-CON) CR tablet 40 mEq (40 mEq Oral Given 07/09/15 1236)     MDM   Final diagnoses:  Dysphagia   74 year old male who presents with 1 week of chest tightness which worsens after he tries to eat or drink anything. Patient well-appearing with reassuring vital signs at presentation. No abdominal tenderness on exam. Obtained EKG which showed prolonged QT but no ischemic changes. Gave the patient 324 mg aspirin on arrival and later a GI cocktail. Obtained above lab work as well as chest x-ray.  Chest x-ray unremarkable. The patient denies any sudden ripping or tearing pain and has no shortness of breath or risk factors for blood clots. Therefore, I feel that aortic dissection or PE are  unlikely cause of his symptoms. Labs including serial troponins are unremarkable. His chart reports a history of esophageal stricture requiring dilation in 2009 as well as long-standing GERD. I suspect that his symptoms are from GI etiology given his history as well as his association of pain with eating. I have instructed patient to follow-up with his gastroenterologist, Dr. Henrene Pastor, who he has seen previously and reviewed dysphasia instructions including avoidance of meat and hard foods. Return precautions reviewed. Patient voiced understanding and will contact gastroenterology today. Patient discharged in satisfactory condition.  Sharlett Iles, MD 07/09/15 [phone]

## 2015-07-16 ENCOUNTER — Ambulatory Visit (INDEPENDENT_AMBULATORY_CARE_PROVIDER_SITE_OTHER): Payer: Medicare HMO | Admitting: Physician Assistant

## 2015-07-16 ENCOUNTER — Encounter: Payer: Self-pay | Admitting: Physician Assistant

## 2015-07-16 VITALS — BP 130/80 | HR 73 | Ht 68.25 in | Wt 183.0 lb

## 2015-07-16 DIAGNOSIS — Z8719 Personal history of other diseases of the digestive system: Secondary | ICD-10-CM

## 2015-07-16 DIAGNOSIS — R1310 Dysphagia, unspecified: Secondary | ICD-10-CM

## 2015-07-16 MED ORDER — SUCRALFATE 1 GM/10ML PO SUSP: 1.0000 g | Freq: Three times a day (TID) | ORAL | Status: DC

## 2015-07-16 MED ORDER — PANTOPRAZOLE SODIUM 40 MG PO TBEC: 40.0000 mg | DELAYED_RELEASE_TABLET | Freq: Every day | ORAL | Status: DC

## 2015-07-16 NOTE — Progress Notes (Signed)
Patient ID: Justin Griffin, male   DOB: 06/04/1940, 74 y.o.   MRN: [id number]   Subjective:    Patient ID: Justin Griffin, male    DOB: 06/04/1940, 74 y.o.   MRN: [id number]  HPI  Justin Griffin is a pleasant 74 year old African-American male known to Dr. Scarlette Shorts. He was last seen here in 2015 when he had colonoscopy for screening. That was a normal exam. Patient does have history of an esophageal stricture and last had EGD in November 2009. Was found to have a distal esophageal stricture with the lumen of 15 mm and was dilated to #52 [place]. On Patient comes in today with no complaints of dysphagia and odynophagia. He had an ER visit on 10/ 4 with similar complaints. He says he had been doing well and will over a week ago he developed fairly abrupt onset of dysphagia and odynophagia. He is having difficulty with liquids and solids. He says everything he eats is uncomfortable going down and seems to want to sit in his chest. He is eating smaller amounts and very slowly. He has not had any episodes of vomiting or regurgitation. He has no complaints of abdominal pain. No significant weight loss. No fever or chills etc. He has not been on any medication for reflux. He does take a an aspirin daily, no other blood thinners. Patient has history of coronary artery disease is status post CABG in 1996. and also has hypertension and rheumatoid arthritis.  Review of Systems Pertinent positive and negative review of systems were noted in the above HPI section.  All other review of systems was otherwise negative.  Outpatient Encounter Prescriptions as of 07/16/2015  Medication Sig  . amLODipine (NORVASC) 2.5 MG tablet take 1 tablet by mouth once daily  . aspirin 325 MG tablet Take 325 mg by mouth daily.  Marland Kitchen atorvastatin (LIPITOR) 80 MG tablet take 1 tablet by mouth once daily  . benazepril-hydrochlorthiazide (LOTENSIN HCT) 20-12.5 MG per tablet take 1 tablet by mouth twice a day  . potassium chloride (K-DUR,KLOR-CON) 10  MEQ tablet take 1 tablet by mouth once daily  . tadalafil (CIALIS) 20 MG tablet Take 1 tablet (20 mg total) by mouth daily as needed for erectile dysfunction.  . pantoprazole (PROTONIX) 40 MG tablet Take 1 tablet (40 mg total) by mouth daily.  . sucralfate (CARAFATE) 1 GM/10ML suspension Take 10 mLs (1 g total) by mouth 4 (four) times daily -  with meals and at bedtime.  . [DISCONTINUED] amLODipine (NORVASC) 2.5 MG tablet take 1 tablet by mouth once daily (Patient not taking: Reported on 07/09/2015)   No facility-administered encounter medications on file as of 07/16/2015.   Allergies  Allergen Reactions  . Zocor [Simvastatin]     Muscle pain   Patient Active Problem List   Diagnosis Date Noted  . ESSENTIAL HYPERTENSION, BENIGN 08/08/2009  . GERD 07/04/2008  . RHEUMATOID ARTHRITIS 07/04/2008  . WEIGHT LOSS-ABNORMAL 07/04/2008  . DYSPHAGIA 07/04/2008  . TOBACCO USER 07/03/2008  . POLYP, COLON 02/04/2008  . DYSLIPIDEMIA 02/04/2008  . Essential hypertension 02/04/2008  . CORONARY ARTERY DISEASE 02/04/2008  . POLYPECTOMY, HX OF 02/04/2008  . CORONARY ARTERY BYPASS GRAFT, HX OF 02/04/2008   Social History   Social History  . Marital Status: Widowed    Spouse Name: N/A  . Number of Children: 3  . Years of Education: N/A   Occupational History  . RETIRED    Social History Main Topics  . Smoking status: Former Smoker  Types: Cigarettes    Quit date: 10/05/1994  . Smokeless tobacco: Never Used  . Alcohol Use: No  . Drug Use: No  . Sexual Activity: Not on file   Other Topics Concern  . Not on file   Social History Narrative    Justin Griffin family history includes Heart disease in his maternal grandfather and mother; Prostate cancer in his paternal grandfather.      Objective:    Filed Vitals:   07/16/15 1359  BP: 130/80  Pulse: 73    Physical Exam  well-developed older African-American male. Appears Younger than stated age- in no acute distress accompanied by  his daughter blood pressure 130/80 pulse 73 height 5 foot 8 weight 183. HEENT ;nontraumatic normocephalic EOMI PERRLA sclera anicteric, voice with wet quality, neck supple no JVD no palpable adenopathy, Cardiovascular; regular rate and rhythm with S1-S2 no murmur or gallop does have a sternal incisional scar, Pulmonary; clear bilaterally, Abdomen; soft nontender nondistended bowel sounds are active there is no palpable mass or hepatosplenomegaly, Rectal ;exam not done, Extremities; no clubbing cyanosis or edema skin warm and dry, Neuropsych; mood and affect appropriate       Assessment & Plan:   #1 74 yo male with new fairly abrupt onset of dysphagia and odynophagia x 1-2 weeks - R/O acute esophagitis/recurrent stricture, R/O malignancy #2 HTN #3 Hx CAD- s/p CABG  Plan; Start Protonix 40 mg po QAM Start carafate liquid 1 gm QID-between meals and bedtime  Very soft diet  Schedule for EGD with possible dilation with Dr. Atilano Ina discussed in detail with pt and he is agreeable to proceed.  He will hold ASA for 3 days prior to procedure Procedure scheduled for 2 weeks out so will check Ba  swallow later this week - to assure no changes in management indicated in the interim   Alfredia Ferguson PA-C 07/16/2015   Cc: Kern Reap, NP

## 2015-07-16 NOTE — Progress Notes (Signed)
Agree with initial assessment and plans 

## 2015-07-16 NOTE — Patient Instructions (Signed)
We sent prescriptions to [facility] [address] 1. Pantoprazole sodium 40 mg  2. Carafate Liquid,   You have been scheduled for an endoscopy. Please follow written instructions given to you at your visit today. If you use inhalers (even only as needed), please bring them with you on the day of your procedure. Your physician has requested that you go to www.startemmi.com and enter the access code given to you at your visit today. This web site gives a general overview about your procedure. However, you should still follow specific instructions given to you by our office regarding your preparation for the procedure.  You have been scheduled for a Barium Esophogram at [facility] (1st floor of the hospital) on 07-19-2015 at 11:30 am . Please arrive at 11:15 am to your appointment for registration. Make certain not to have anything to eat or drink 3 hours prior to your test. If you need to reschedule for any reason, please contact radiology at [phone] to do so. __________________________________________________________________ A barium swallow is an examination that concentrates on views of the esophagus. This tends to be a double contrast exam (barium and two liquids which, when combined, create a gas to distend the wall of the oesophagus) or single contrast (non-ionic iodine based). The study is usually tailored to your symptoms so a good history is essential. Attention is paid during the study to the form, structure and configuration of the esophagus, looking for functional disorders (such as aspiration, dysphagia, achalasia, motility and reflux) EXAMINATION You may be asked to change into a gown, depending on the type of swallow being performed. A radiologist and radiographer will perform the procedure. The radiologist will advise you of the type of contrast selected for your procedure and direct you during the exam. You will be asked to stand, sit or lie in several different positions  and to hold a small amount of fluid in your mouth before being asked to swallow while the imaging is performed .In some instances you may be asked to swallow barium coated marshmallows to assess the motility of a solid food bolus. The exam can be recorded as a digital or video fluoroscopy procedure. POST PROCEDURE It will take 1-2 days for the barium to pass through your system. To facilitate this, it is important, unless otherwise directed, to increase your fluids for the next 24-48hrs and to resume your normal diet.  This test typically takes about 30 minutes to perform. __________________________________________________________________________________

## 2015-07-19 ENCOUNTER — Ambulatory Visit (HOSPITAL_COMMUNITY)
Admission: RE | Admit: 2015-07-19 | Discharge: 2015-07-19 | Disposition: A | Discharge status: Home / Self Care | Payer: Medicare HMO | Source: Ambulatory Visit | Attending: Physician Assistant | Admitting: Physician Assistant

## 2015-07-19 DIAGNOSIS — K224 Dyskinesia of esophagus: Secondary | ICD-10-CM | POA: Diagnosis not present

## 2015-07-19 DIAGNOSIS — K449 Diaphragmatic hernia without obstruction or gangrene: Secondary | ICD-10-CM | POA: Insufficient documentation

## 2015-07-19 DIAGNOSIS — R1310 Dysphagia, unspecified: Secondary | ICD-10-CM | POA: Diagnosis not present

## 2015-07-23 ENCOUNTER — Telehealth: Payer: Self-pay | Admitting: Physician Assistant

## 2015-07-24 NOTE — Telephone Encounter (Signed)
I reviewed the results and the instructions for the EGD.  All questions answered She will call back for any additional questions or concerns

## 2015-07-24 NOTE — Telephone Encounter (Signed)
Left message for patient to call back  

## 2015-07-29 ENCOUNTER — Encounter: Payer: Self-pay | Admitting: Internal Medicine

## 2015-07-29 ENCOUNTER — Ambulatory Visit (AMBULATORY_SURGERY_CENTER): Payer: Medicare HMO | Admitting: Internal Medicine

## 2015-07-29 VITALS — BP 99/71 | HR 69 | Temp 96.0°F | Resp 24 | Ht 68.0 in | Wt 183.0 lb

## 2015-07-29 DIAGNOSIS — K222 Esophageal obstruction: Secondary | ICD-10-CM | POA: Diagnosis not present

## 2015-07-29 DIAGNOSIS — K2100 Gastro-esophageal reflux disease with esophagitis, without bleeding: Secondary | ICD-10-CM

## 2015-07-29 DIAGNOSIS — R1310 Dysphagia, unspecified: Secondary | ICD-10-CM | POA: Diagnosis not present

## 2015-07-29 MED ORDER — SODIUM CHLORIDE 0.9 % IV SOLN: 500.0000 mL | INTRAVENOUS | Status: DC

## 2015-07-29 NOTE — Op Note (Signed)
[place]  [address] [place], [address]   ENDOSCOPY PROCEDURE REPORT  PATIENT: Samael, Blades  MR#: [id number] BIRTHDATE: 06/04/1940 , 19  yrs. old GENDER: male ENDOSCOPIST: Eustace Quail, MD REFERRED BY:  .  Self / Office PROCEDURE DATE:  07/29/2015 PROCEDURE:  EGD, diagnostic and Maloney dilation of esophagus   -52, 24 French ASA CLASS:     Class II INDICATIONS:  dysphagia. MEDICATIONS: Monitored anesthesia care and Propofol 150 mg IV TOPICAL ANESTHETIC: none  DESCRIPTION OF PROCEDURE: After the risks benefits and alternatives of the procedure were thoroughly explained, informed consent was obtained.  The LB [id number] endoscope was introduced through the mouth and advanced to the second portion of the duodenum , Without limitations.  The instrument was slowly withdrawn as the mucosa was fully examined.    EXAM:Esophagus revealed a benign peptic stricture at the gastroesophageal junction measuring approximately 14 mm in diameter.  There was associated esophagitis.  Stomach and duodenum were normal.  Retroflexed views revealed a hiatal hernia.     The scope was then withdrawn from the patient and the procedure completed. THERAPY: 52 then 54 French Maloney dilators were passed into the esophagus without resistance. Scant heme. Tolerated well  COMPLICATIONS: There were no immediate complications.  ENDOSCOPIC IMPRESSION: 1. GERD with esophagitis and peptic stricture status post esophageal dilation to 14 French  RECOMMENDATIONS: 1.  Clear liquids until 12 noon, then soft food rest iof day. Resume prior diet tomorrow. 2.  Continue pantoprazole 40 mg daily 3. Chew food well 4. Contact this office for recurrent difficulties with swallowing. Return to the care of your primary provider  REPEAT EXAM:  eSigned:  Eustace Quail, MD 07/29/2015 10:19 AM    CC:The Patient

## 2015-07-29 NOTE — Progress Notes (Signed)
Called to room to assist during endoscopic procedure.  Patient ID and intended procedure confirmed with present staff. Received instructions for my participation in the procedure from the performing physician.  

## 2015-07-29 NOTE — Patient Instructions (Signed)
YOU HAD AN ENDOSCOPIC PROCEDURE TODAY AT THE [facility]:   Refer to the procedure report that was given to you for any specific questions about what was found during the examination.  If the procedure report does not answer your questions, please call your gastroenterologist to clarify.  If you requested that your care partner not be given the details of your procedure findings, then the procedure report has been included in a sealed envelope for you to review at your convenience later.  YOU SHOULD EXPECT: Some feelings of bloating in the abdomen. Passage of more gas than usual.  Walking can help get rid of the air that was put into your GI tract during the procedure and reduce the bloating. If you had a lower endoscopy (such as a colonoscopy or flexible sigmoidoscopy) you may notice spotting of blood in your stool or on the toilet paper. If you underwent a bowel prep for your procedure, you may not have a normal bowel movement for a few days.  Please Note:  You might notice some irritation and congestion in your nose or some drainage.  This is from the oxygen used during your procedure.  There is no need for concern and it should clear up in a day or so.  SYMPTOMS TO REPORT IMMEDIATELY:    Following upper endoscopy (EGD)  Vomiting of blood or coffee ground material  New chest pain or pain under the shoulder blades  Painful or persistently difficult swallowing  New shortness of breath  Fever of 100F or higher  Black, tarry-looking stools  For urgent or emergent issues, a gastroenterologist can be reached at any hour by calling [phone].   DIET: Your first meal following the procedure should be a small meal and then it is ok to progress to your normal diet. Heavy or fried foods are harder to digest and may make you feel nauseous or bloated.  Likewise, meals heavy in dairy and vegetables can increase bloating.  Drink plenty of fluids but you should avoid alcoholic beverages  for 24 hours.  ACTIVITY:  You should plan to take it easy for the rest of today and you should NOT DRIVE or use heavy machinery until tomorrow (because of the sedation medicines used during the test).    FOLLOW UP: Our staff will call the number listed on your records the next business day following your procedure to check on you and address any questions or concerns that you may have regarding the information given to you following your procedure. If we do not reach you, we will leave a message.  However, if you are feeling well and you are not experiencing any problems, there is no need to return our call.  We will assume that you have returned to your regular daily activities without incident.  If any biopsies were taken you will be contacted by phone or by letter within the next 1-3 weeks.  Please call us at [phone] if you have not heard about the biopsies in 3 weeks.    SIGNATURES/CONFIDENTIALITY: You and/or your care partner have signed paperwork which will be entered into your electronic medical record.  These signatures attest to the fact that that the information above on your After Visit Summary has been reviewed and is understood.  Full responsibility of the confidentiality of this discharge information lies with you and/or your care-partner. 

## 2015-07-29 NOTE — Progress Notes (Signed)
Transferred to recovery room. A/O x3, pleased with MAC.  VSS.  Report to Jill, RN. 

## 2015-07-30 ENCOUNTER — Telehealth: Payer: Self-pay | Admitting: *Deleted

## 2015-07-30 NOTE — Telephone Encounter (Signed)
  Follow up Call-  Call back number 07/29/2015 11/08/2013  Post procedure Call Back phone  # [phone]  Permission to leave phone message Yes Yes     Patient questions:  Do you have a fever, pain , or abdominal swelling? No. Pain Score  0 *  Have you tolerated food without any problems? Yes.    Have you been able to return to your normal activities? Yes.    Do you have any questions about your discharge instructions: Diet   No. Medications  No. Follow up visit  No.  Do you have questions or concerns about your Care? No.  Actions: * If pain score is 4 or above: No action needed, pain <4.

## 2015-08-05 ENCOUNTER — Other Ambulatory Visit: Payer: Self-pay | Admitting: Cardiology

## 2015-08-14 ENCOUNTER — Ambulatory Visit: Payer: Medicare HMO | Admitting: Internal Medicine

## 2015-08-14 ENCOUNTER — Other Ambulatory Visit: Payer: Self-pay | Admitting: Physician Assistant

## 2015-08-19 ENCOUNTER — Other Ambulatory Visit: Payer: Self-pay | Admitting: Cardiology

## 2015-08-28 ENCOUNTER — Telehealth: Payer: Self-pay | Admitting: Internal Medicine

## 2015-08-28 NOTE — Telephone Encounter (Signed)
Pt is supposed to have cataract surgery 09/05/15. Eye doctor is requesting a note from Dr. Henrene Pastor ok'ing the pt for surgery. Daughter to call back and let us know the surgeons name.

## 2015-09-03 ENCOUNTER — Telehealth: Payer: Self-pay | Admitting: Internal Medicine

## 2015-09-04 NOTE — Telephone Encounter (Signed)
I manage his acid reflux, not overall general medical care. I will not provide surgical clearance, this needs to go through his primary provider.

## 2015-09-04 NOTE — Telephone Encounter (Signed)
See additional phone note. 

## 2015-09-04 NOTE — Telephone Encounter (Signed)
Pt is supposed to have cataract surgery tomorrow and Dr. Brooke Dare wants a letter clearing him for the surgery. Family states Dr. Brooke Dare stated that since pt had a procedure done with [place] recently he would need clearance from Dr. Henrene Pastor. Please advise.

## 2015-09-04 NOTE — Telephone Encounter (Signed)
Left message to call back  

## 2015-09-04 NOTE — Telephone Encounter (Signed)
Spoke with Justin Griffin and she is aware.

## 2015-09-11 ENCOUNTER — Telehealth: Payer: Self-pay | Admitting: Cardiology

## 2015-09-11 NOTE — Telephone Encounter (Signed)
The patient is OK to have cataract surgery.

## 2015-09-11 NOTE — Telephone Encounter (Signed)
Returned call to patient's daughter [name].She stated father needed cardiac clearance from Dr.Hochrein to have cataract surgery with Dr.Mincey.Advised I will send message to Dr.Hochrein.

## 2015-09-11 NOTE — Telephone Encounter (Signed)
She wants to know if he have been cleared for his cataract surgery? You should have received fax from Dr Colan Neptune office.

## 2015-09-12 NOTE — Telephone Encounter (Signed)
Signed clearance form was faxed back to [facility].

## 2015-10-01 ENCOUNTER — Other Ambulatory Visit: Payer: Self-pay | Admitting: Cardiology

## 2015-10-18 ENCOUNTER — Ambulatory Visit: Payer: Medicare HMO | Admitting: Cardiology

## 2015-11-21 ENCOUNTER — Ambulatory Visit (INDEPENDENT_AMBULATORY_CARE_PROVIDER_SITE_OTHER): Payer: Medicare HMO | Admitting: Cardiology

## 2015-11-21 ENCOUNTER — Encounter: Payer: Self-pay | Admitting: Cardiology

## 2015-11-21 VITALS — BP 130/92 | HR 72 | Ht 68.25 in | Wt 188.5 lb

## 2015-11-21 DIAGNOSIS — Z79899 Other long term (current) drug therapy: Secondary | ICD-10-CM

## 2015-11-21 DIAGNOSIS — R413 Other amnesia: Secondary | ICD-10-CM

## 2015-11-21 DIAGNOSIS — E785 Hyperlipidemia, unspecified: Secondary | ICD-10-CM

## 2015-11-21 NOTE — Progress Notes (Signed)
HPI The patient presents for followup of coronary disease. Since then he said no new cardiovascular problems. He denies any chest pressure, neck or arm discomfort. He has no palpitations, presyncope or syncope. He has no PND or orthopnea.  He stays very active.  He is still working part time fixing cars.   He is having increasing problems with his memory.     Allergies  Allergen Reactions  . Zocor [Simvastatin]     Muscle pain    Current Outpatient Prescriptions  Medication Sig Dispense Refill  . amLODipine (NORVASC) 2.5 MG tablet take 1 tablet by mouth once daily 30 tablet 0  . aspirin 325 MG tablet Take 325 mg by mouth daily.    Marland Kitchen atorvastatin (LIPITOR) 80 MG tablet take 1 tablet by mouth once daily 30 tablet 6  . benazepril-hydrochlorthiazide (LOTENSIN HCT) 20-12.5 MG tablet take 1 tablet by mouth twice a day 60 tablet 1  . CARAFATE 1 GM/10ML suspension take 10 milliliters (2 teaspoonfuls) by mouth four times a day with meals and at bedtime 420 mL 1  . pantoprazole (PROTONIX) 40 MG tablet Take 1 tablet (40 mg total) by mouth daily. 30 tablet 11  . potassium chloride (K-DUR,KLOR-CON) 10 MEQ tablet take 1 tablet by mouth once daily 90 tablet 0  . tadalafil (CIALIS) 20 MG tablet Take 1 tablet (20 mg total) by mouth daily as needed for erectile dysfunction. 10 tablet 6   No current facility-administered medications for this visit.    Past Medical History  Diagnosis Date  . CAD (coronary artery disease)     Catheterization 2006 LAD occluded, OM 95% stenosis followed by mid occlusion, first obtuse marginal occluded, right coronary artery diffuse moderate disease. LIMA to the LAD was patent. Saphenous vein to PDA and posterior lateral patent with diffuse luminal irregularities, saphenous vein graft to second obtuse marginal is patent, saphenous vein graft to the first obtuse marginal had 25% stenosis.  . Other and unspecified hyperlipidemia   . Other dysphagia   . Unspecified essential  hypertension   . Esophageal reflux   . Rheumatoid arthritis(714.0)   . Esophageal stricture   . Status post dilation of esophageal narrowing     Past Surgical History  Procedure Laterality Date  . Coronary artery bypass graft  1996  . Left digit amputation      ROS:  As stated in the HPI and negative for all other systems.  PHYSICAL EXAM BP 130/92 mmHg  Pulse 72  Ht 5' 8.25" (1.734 m)  Wt 188 lb 8 oz (85.503 kg)  BMI 28.44 kg/m2 GENERAL:  Well appearing NECK:  No jugular venous distention, waveform within normal limits, carotid upstroke brisk and symmetric, no bruits, no thyromegaly LUNGS:  Clear to auscultation bilaterally BACK:  No CVA tenderness CHEST:  Unremarkable HEART:  PMI not displaced or sustained,S1 and S2 within normal limits, no S3, no S4, no clicks, no rubs, no murmurs ABD:  Flat, positive bowel sounds normal in frequency in pitch, no bruits, no rebound, no guarding, no midline pulsatile mass, no hepatomegaly, no splenomegaly EXT:  2 plus pulses throughout, no edema, no cyanosis no clubbing   EKG:  Sinus rhythm, rate 64, leftward axis, intervals within normal limits, nonspecific inferior T-wave flattening, no acute ST-T wave changes.   11/21/2015   ASSESSMENT AND PLAN  CAD:   The patient has no new sypmtoms.  No further cardiovascular testing is indicated.  We will continue with aggressive risk reduction and meds as listed.  HTN:  The blood pressure is at target. No change in medications is indicated. We will continue with therapeutic lifestyle changes (TLC).  HYPERLIPIDEMIA:  I will check a fasting lipid profile and liver enzymes.  He will need to come back fasting.  MEMORY DISORDER:  He is describing this and I will refer to neurology.

## 2015-11-21 NOTE — Patient Instructions (Addendum)
Medication Instructions: Dr Percival Spanish has made no changes to your current medications.  Labwork: Your physician recommends that you return for lab work at your earliest [place].  Testing/Procedures: NONE  Referrals: You have been referred to [facility] for memory.  Follow-up: Dr Percival Spanish recommends that you schedule a follow-up appointment in 1 year. You will receive a reminder letter in the mail two months in advance. If you don't receive a letter, please call our office to schedule the follow-up appointment.  If you need a refill on your cardiac medications before your next appointment, please call your pharmacy.

## 2015-11-26 NOTE — Progress Notes (Signed)
Per pt - he plans on going tomorrow 2/22

## 2015-11-26 NOTE — Progress Notes (Signed)
Per pt - he plans on having blood work drawn 2/22

## 2015-11-28 LAB — HEPATIC FUNCTION PANEL
ALK PHOS: 74 U/L (ref 40–115)
ALT: 20 U/L (ref 9–46)
AST: 24 U/L (ref 10–35)
Albumin: 4 g/dL (ref 3.6–5.1)
BILIRUBIN INDIRECT: 0.6 mg/dL (ref 0.2–1.2)
BILIRUBIN TOTAL: 0.8 mg/dL (ref 0.2–1.2)
Bilirubin, Direct: 0.2 mg/dL (ref <=0.2)
Total Protein: 7.7 g/dL (ref 6.1–8.1)

## 2015-11-28 LAB — LIPID PANEL
CHOL/HDL RATIO: 2.3 ratio (ref <=5.0)
Cholesterol: 116 mg/dL — ABNORMAL LOW (ref 125–200)
HDL: 51 mg/dL (ref >=40)
LDL CALC: 55 mg/dL (ref <130)
Triglycerides: 52 mg/dL (ref <150)
VLDL: 10 mg/dL (ref <30)

## 2015-11-28 NOTE — Addendum Note (Signed)
Addended by: Diana Eves on: 11/28/2015 02:48 PM   Modules accepted: Orders

## 2015-12-06 ENCOUNTER — Other Ambulatory Visit (INDEPENDENT_AMBULATORY_CARE_PROVIDER_SITE_OTHER): Payer: Medicare HMO

## 2015-12-06 ENCOUNTER — Encounter: Payer: Self-pay | Admitting: Neurology

## 2015-12-06 ENCOUNTER — Ambulatory Visit (INDEPENDENT_AMBULATORY_CARE_PROVIDER_SITE_OTHER): Payer: Medicare HMO | Admitting: Neurology

## 2015-12-06 VITALS — BP 152/82 | HR 84 | Wt 191.0 lb

## 2015-12-06 DIAGNOSIS — F0390 Unspecified dementia without behavioral disturbance: Secondary | ICD-10-CM | POA: Diagnosis not present

## 2015-12-06 DIAGNOSIS — I10 Essential (primary) hypertension: Secondary | ICD-10-CM

## 2015-12-06 LAB — VITAMIN B12: Vitamin B-12: 470 pg/mL (ref 211–911)

## 2015-12-06 LAB — TSH: TSH: 0.54 u[IU]/mL (ref 0.35–4.50)

## 2015-12-06 LAB — FOLATE: Folate: 21.1 ng/mL (ref >5.9)

## 2015-12-06 MED ORDER — DONEPEZIL HCL 5 MG PO TABS: 5.0000 mg | ORAL_TABLET | Freq: Every day | ORAL | Status: DC

## 2015-12-06 NOTE — Progress Notes (Addendum)
NEUROLOGY CONSULTATION NOTE  Justin Griffin MRN: [id number] DOB: 06/04/1940  Referring provider: Dr. Percival Spanish Primary care provider: Dr. Percival Spanish  Reason for consult:  Memory problems  HISTORY OF PRESENT ILLNESS: Justin Griffin is a 75 year old right-handed male with CAD, rheumatoid arthritis and hyperlipidemia who presents for memory problems.  History obtained by patient and cardiology note.  Patient reports problems with memory for unknown period of time.  He says that he sometimes gets the date mixed up, including day of week or even year.  He lives by himself and says he manages his finances and performs all house chores without difficulty.  He still drives without issue.  He denies disorientation on familiar routes or near-accidents.  He denies short term memory problems such as forgetting recent conversation or repeating questions.  He denies difficulty remembering names or faces of familiar people.  His daughter lives in [place] and she reportedly has not expressed concern to him.  He did not graduate high school.  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  He denies family history of dementia.  He denies history of alcohol abuse, stroke or head trauma.  PAST MEDICAL HISTORY: Past Medical History  Diagnosis Date  . CAD (coronary artery disease)     Catheterization 2006 LAD occluded, OM 95% stenosis followed by mid occlusion, first obtuse marginal occluded, right coronary artery diffuse moderate disease. LIMA to the LAD was patent. Saphenous vein to PDA and posterior lateral patent with diffuse luminal irregularities, saphenous vein graft to second obtuse marginal is patent, saphenous vein graft to the first obtuse marginal had 25% stenosis.  . Other and unspecified hyperlipidemia   . Other dysphagia   . Unspecified essential hypertension   . Esophageal reflux   . Rheumatoid arthritis(714.0)   . Esophageal stricture   . Status post dilation of esophageal narrowing     PAST  SURGICAL HISTORY: Past Surgical History  Procedure Laterality Date  . Coronary artery bypass graft  1996  . Left digit amputation      MEDICATIONS: Current Outpatient Prescriptions on File Prior to Visit  Medication Sig Dispense Refill  . amLODipine (NORVASC) 2.5 MG tablet take 1 tablet by mouth once daily 30 tablet 0  . aspirin 325 MG tablet Take 325 mg by mouth daily.    Marland Kitchen atorvastatin (LIPITOR) 80 MG tablet take 1 tablet by mouth once daily 30 tablet 6  . benazepril-hydrochlorthiazide (LOTENSIN HCT) 20-12.5 MG tablet take 1 tablet by mouth twice a day 60 tablet 1  . pantoprazole (PROTONIX) 40 MG tablet Take 1 tablet (40 mg total) by mouth daily. 30 tablet 11  . potassium chloride (K-DUR,KLOR-CON) 10 MEQ tablet take 1 tablet by mouth once daily 90 tablet 0   No current facility-administered medications on file prior to visit.    ALLERGIES: Allergies  Allergen Reactions  . Zocor [Simvastatin]     Muscle pain    FAMILY HISTORY: Family History  Problem Relation Age of Onset  . Prostate cancer Paternal Grandfather   . Heart disease Maternal Grandfather   . Heart disease Mother     SOCIAL HISTORY: Social History   Social History  . Marital Status: Widowed    Spouse Name: N/A  . Number of Children: 3  . Years of Education: N/A   Occupational History  . RETIRED    Social History Main Topics  . Smoking status: Former Smoker    Types: Cigarettes    Quit date: 10/05/1994  . Smokeless  tobacco: Never Used  . Alcohol Use: No  . Drug Use: No  . Sexual Activity: Not on file   Other Topics Concern  . Not on file   Social History Narrative   Pt didn't complete h.s.    REVIEW OF SYSTEMS: Constitutional: No fevers, chills, or sweats, no generalized fatigue, change in appetite Eyes: No visual changes, double vision, eye pain Ear, nose and throat: No hearing loss, ear pain, nasal congestion, sore throat Cardiovascular: No chest pain, palpitations Respiratory:  No  shortness of breath at rest or with exertion, wheezes GastrointestinaI: No nausea, vomiting, diarrhea, abdominal pain, fecal incontinence Genitourinary:  No dysuria, urinary retention or frequency Musculoskeletal:  No neck pain, back pain Integumentary: No rash, pruritus, skin lesions Neurological: as above Psychiatric: No depression, insomnia, anxiety Endocrine: No palpitations, fatigue, diaphoresis, mood swings, change in appetite, change in weight, increased thirst Hematologic/Lymphatic:  No anemia, purpura, petechiae. Allergic/Immunologic: no itchy/runny eyes, nasal congestion, recent allergic reactions, rashes  PHYSICAL EXAM: Filed Vitals:   12/06/15 0744  BP: 152/82  Pulse: 84   General: No acute distress.  Patient appears well-groomed. Head:  Normocephalic/atraumatic Eyes:  fundi unremarkable, without vessel changes, exudates, hemorrhages or papilledema. Neck: supple, no paraspinal tenderness, full range of motion Back: No paraspinal tenderness Heart: regular rate and rhythm Lungs: Clear to auscultation bilaterally. Vascular: No carotid bruits. Neurological Exam: Mental status: alert and oriented to person, city and day of week only, recent memory poor, remote memory intact, fund of knowledge fair, attention and concentration poor, speech fluent and not dysarthric, deficiencies in naming and naming fluency.  Able to repeat and follow commands.  When he filled out the [employer] form, he wrote that his birth date was [birth year].  When I later asked him his birth date, he gave me the correct response. Montreal Cognitive Assessment  12/06/2015  Visuospatial/ Executive (0/5) 1  Naming (0/3) 1  Attention: Read list of digits (0/2) 1  Attention: Read list of letters (0/1) 0  Attention: Serial 7 subtraction starting at 100 (0/3) 0  Language: Repeat phrase (0/2) 1  Language : Fluency (0/1) 0  Abstraction (0/2) 1  Delayed Recall (0/5) 0  Orientation (0/6) 2  Total 7    Adjusted Score (based on education) 8   Cranial nerves: CN I: not tested CN II: pupils equal, round and reactive to light, visual fields intact, fundi unremarkable, without vessel changes, exudates, hemorrhages or papilledema. CN III, IV, VI:  full range of motion, no nystagmus, no ptosis CN V: facial sensation intact CN VII: upper and lower face symmetric CN VIII: hearing intact CN IX, X: gag intact, uvula midline CN XI: sternocleidomastoid and trapezius muscles intact CN XII: tongue midline Bulk & Tone: normal, no fasciculations. Motor:  5/5 throughout  Sensation:  Pinprick and vibration sensation intact. Deep Tendon Reflexes:  2+ throughout, toes downgoing.  Finger to nose testing:  Without dysmetria.  Heel to shin:  Without dysmetria.  Gait:  Normal station and stride.  Able to turn and tandem walk. Romberg negative.  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  Difficulty to characterize given poor history HTN  PLAN: 1.  Will initiate Aricept 5mg  at bedtime for 4 weeks and then increase to 10mg  at bedtime if tolerationg 2.  MRI of brain without contrast 3.  Check B12, folate, TSH, RPR 4.  May refer for formal neuropsych testing 5.  I advised that he should not drive.  I would like him to receive formal driving evaluation  with the Medical Evaluation Program through the [facility] DMV 6.  Home health assessment 7.  I would like somebody (daughter) to accompany him next visit 8.  Follow up in 6 months.   9.  BP elevated.  Should be rechecked with Dr. Percival Spanish  45 minutes spent face to face with patient, over 50% spent discussing diagnosis and management.  Thank you for allowing me to take part in the care of this patient.  Metta Clines, DO  CC:  Minus Breeding, MD

## 2015-12-06 NOTE — Patient Instructions (Addendum)
1.  We will start donepezil (Aricept) 5mg  daily for four weeks.  If you are tolerating the medication, then after four weeks, we will increase the dose to 10mg  daily.  Side effects include nausea, vomiting, diarrhea, vivid dreams, and muscle cramps.  Please call the clinic if you experience any of these symptoms. 2.  We will check MRI of brain without contrast 3.  We will check B12, folate, TSH, RPR 4.  We may need to send you for more formal memory testing 5.  You should NOT drive.  I would like you to get a formal driving evaluation via the Medical Evaluation Program of the [place] [phone]) 6.  I think we need to get a home health assessment  7.  Follow up in 6 months.  I want your daughter to accompany you for next visit.

## 2015-12-07 LAB — RPR

## 2015-12-11 ENCOUNTER — Telehealth: Payer: Self-pay

## 2015-12-11 ENCOUNTER — Other Ambulatory Visit: Payer: Self-pay | Admitting: Neurology

## 2015-12-11 NOTE — Telephone Encounter (Signed)
-----   Message from Pieter Partridge, DO sent at 12/08/2015  5:53 PM EST ----- Labs are normal

## 2015-12-11 NOTE — Telephone Encounter (Signed)
Left message on machine for pt to return call to the office.  

## 2015-12-13 ENCOUNTER — Inpatient Hospital Stay: Admission: RE | Admit: 2015-12-13 | Payer: Medicare HMO | Source: Ambulatory Visit

## 2016-01-04 ENCOUNTER — Other Ambulatory Visit: Payer: Medicare HMO

## 2016-01-08 DIAGNOSIS — H401112 Primary open-angle glaucoma, right eye, moderate stage: Secondary | ICD-10-CM | POA: Diagnosis not present

## 2016-01-14 ENCOUNTER — Telehealth: Payer: Self-pay | Admitting: Neurology

## 2016-01-14 MED ORDER — DONEPEZIL HCL 10 MG PO TABS: 10.0000 mg | ORAL_TABLET | Freq: Every day | ORAL | Status: DC

## 2016-01-14 NOTE — Telephone Encounter (Signed)
Yes.  Please increase Aricept to 10mg  at bedtime with refills

## 2016-01-14 NOTE — Telephone Encounter (Signed)
Pt daughter robin called and would like to talk to someone about the dosage of the medication please call [phone]

## 2016-01-14 NOTE — Telephone Encounter (Signed)
New RX sent in w/ refills.

## 2016-01-14 NOTE — Telephone Encounter (Signed)
Daughter, Sheran Spine in to let you know pt is having no side effects on the Aricept. Okay to increase to 10 mg daily? Please advise.

## 2016-01-20 ENCOUNTER — Other Ambulatory Visit: Payer: Self-pay | Admitting: *Deleted

## 2016-01-22 ENCOUNTER — Other Ambulatory Visit: Payer: Self-pay | Admitting: *Deleted

## 2016-01-22 MED ORDER — AMLODIPINE BESYLATE 2.5 MG PO TABS: 2.5000 mg | ORAL_TABLET | Freq: Every day | ORAL | Status: DC

## 2016-01-22 NOTE — Telephone Encounter (Signed)
Rx request sent to pharmacy.  

## 2016-01-27 ENCOUNTER — Encounter: Payer: Self-pay | Admitting: Family

## 2016-01-27 ENCOUNTER — Ambulatory Visit (INDEPENDENT_AMBULATORY_CARE_PROVIDER_SITE_OTHER): Payer: Commercial Managed Care - HMO | Admitting: Family

## 2016-01-27 VITALS — BP 120/80 | HR 79 | Temp 97.9°F | Resp 16 | Ht 68.25 in | Wt 190.0 lb

## 2016-01-27 DIAGNOSIS — Z0000 Encounter for general adult medical examination without abnormal findings: Secondary | ICD-10-CM

## 2016-01-27 DIAGNOSIS — Z23 Encounter for immunization: Secondary | ICD-10-CM | POA: Diagnosis not present

## 2016-01-27 NOTE — Assessment & Plan Note (Signed)
1) Anticipatory Guidance: Discussed importance of wearing a seatbelt while driving and not texting while driving; changing batteries in smoke detector at least once annually; wearing suntan lotion when outside; eating a balanced and moderate diet; getting physical activity at least 30 minutes per day.  2) Immunizations / Screenings / Labs:  Tetanus and Prevnar updated today. Potential plan for Zostavax which patient will review information. All other immunizations are up-to-date per recommendations. Obtain PSA for prostate cancer screening. All other screenings are up-to-date per recommendations. Obtain CBC, and CMET.  Overall well exam with risk factors for cardiovascular disease including dyslipidemia, coronary artery disease, and hypertension. Chronic conditions are well maintained with medications with no adverse side effects. Continue current dosage of medications for chronic conditions. Encouraged increasing physical activity to 30 minutes of moderate level activity daily. Otherwise, continue a nutritional intake that is moderate, balance, and varied. Follow-up prevention exam in 1 year. Follow-up office visit for chronic conditions pending blood work.

## 2016-01-27 NOTE — Progress Notes (Signed)
Pre visit review using our clinic review tool, if applicable. No additional management support is needed unless otherwise documented below in the visit note. 

## 2016-01-27 NOTE — Patient Instructions (Addendum)
Thank you for choosing [facility].  Summary/Instructions:  Please stop by the lab on the basement level of the building for your blood work. Your results will be released to [place] (or called to you) after review, usually within 72 hours after test completion. If any changes need to be made, you will be notified at that same time.  Health Maintenance  Topic Date Due  . TETANUS/TDAP  06/[birth year]/1959  . ZOSTAVAX  06/04/2000  . PNA vac Low Risk Adult (1 of 2 - PCV13) 06/04/2005  . INFLUENZA VACCINE  05/05/2016  . COLONOSCOPY  11/09/2023   Health Maintenance, Male A healthy lifestyle and preventative care can promote health and wellness.  Maintain regular health, dental, and eye exams.  Eat a healthy diet. Foods like vegetables, fruits, whole grains, low-fat dairy products, and lean protein foods contain the nutrients you need and are low in calories. Decrease your intake of foods high in solid fats, added sugars, and salt. Get information about a proper diet from your health care provider, if necessary.  Regular physical exercise is one of the most important things you can do for your health. Most adults should get at least 150 minutes of moderate-intensity exercise (any activity that increases your heart rate and causes you to sweat) each week. In addition, most adults need muscle-strengthening exercises on 2 or more days a week.   Maintain a healthy weight. The body mass index (BMI) is a screening tool to identify possible weight problems. It provides an estimate of body fat based on height and weight. Your health care provider can find your BMI and can help you achieve or maintain a healthy weight. For males 20 years and older:  A BMI below 18.5 is considered underweight.  A BMI of 18.5 to 24.9 is normal.  A BMI of 25 to 29.9 is considered overweight.  A BMI of 30 and above is considered obese.  Maintain normal blood lipids and cholesterol by exercising and minimizing your  intake of saturated fat. Eat a balanced diet with plenty of fruits and vegetables. Blood tests for lipids and cholesterol should begin at age 15 and be repeated every 5 years. If your lipid or cholesterol levels are high, you are over age 77, or you are at high risk for heart disease, you may need your cholesterol levels checked more frequently.Ongoing high lipid and cholesterol levels should be treated with medicines if diet and exercise are not working.  If you smoke, find out from your health care provider how to quit. If you do not use tobacco, do not start.  Lung cancer screening is recommended for adults aged 53-80 years who are at high risk for developing lung cancer because of a history of smoking. A yearly low-dose CT scan of the lungs is recommended for people who have at least a 30-pack-year history of smoking and are current smokers or have quit within the past 15 years. A pack year of smoking is smoking an average of 1 pack of cigarettes a day for 1 year (for example, a 30-pack-year history of smoking could mean smoking 1 pack a day for 30 years or 2 packs a day for 15 years). Yearly screening should continue until the smoker has stopped smoking for at least 15 years. Yearly screening should be stopped for people who develop a health problem that would prevent them from having lung cancer treatment.  If you choose to drink alcohol, do not have more than 2 drinks per day. One drink  is considered to be 12 oz (360 mL) of beer, 5 oz (150 mL) of wine, or 1.5 oz (45 mL) of liquor.  Avoid the use of street drugs. Do not share needles with anyone. Ask for help if you need support or instructions about stopping the use of drugs.  High blood pressure causes heart disease and increases the risk of stroke. High blood pressure is more likely to develop in:  People who have blood pressure in the end of the normal range (100-139/85-89 mm Hg).  People who are overweight or obese.  People who are  African American.  If you are 65-70 years of age, have your blood pressure checked every 3-5 years. If you are 84 years of age or older, have your blood pressure checked every year. You should have your blood pressure measured twice--once when you are at a hospital or clinic, and once when you are not at a hospital or clinic. Record the average of the two measurements. To check your blood pressure when you are not at a hospital or clinic, you can use:  An automated blood pressure machine at a pharmacy.  A home blood pressure monitor.  If you are 36-16 years old, ask your health care provider if you should take aspirin to prevent heart disease.  Diabetes screening involves taking a blood sample to check your fasting blood sugar level. This should be done once every 3 years after age 80 if you are at a normal weight and without risk factors for diabetes. Testing should be considered at a younger age or be carried out more frequently if you are overweight and have at least 1 risk factor for diabetes.  Colorectal cancer can be detected and often prevented. Most routine colorectal cancer screening begins at the age of 60 and continues through age 37. However, your health care provider may recommend screening at an earlier age if you have risk factors for colon cancer. On a yearly basis, your health care provider may provide home test kits to check for hidden blood in the stool. A small camera at the end of a tube may be used to directly examine the colon (sigmoidoscopy or colonoscopy) to detect the earliest forms of colorectal cancer. Talk to your health care provider about this at age 36 when routine screening begins. A direct exam of the colon should be repeated every 5-10 years through age 79, unless early forms of precancerous polyps or small growths are found.  People who are at an increased risk for hepatitis B should be screened for this virus. You are considered at high risk for hepatitis B  if:  You were born in a country where hepatitis B occurs often. Talk with your health care provider about which countries are considered high risk.  Your parents were born in a high-risk country and you have not received a shot to protect against hepatitis B (hepatitis B vaccine).  You have HIV or AIDS.  You use needles to inject street drugs.  You live with, or have sex with, someone who has hepatitis B.  You are a man who has sex with other men (MSM).  You get hemodialysis treatment.  You take certain medicines for conditions like cancer, organ transplantation, and autoimmune conditions.  Hepatitis C blood testing is recommended for all people born from [birth year] through 1965 and any individual with known risk factors for hepatitis C.  Healthy men should no longer receive prostate-specific antigen (PSA) blood tests as part of routine cancer  screening. Talk to your health care provider about prostate cancer screening.  Testicular cancer screening is not recommended for adolescents or adult males who have no symptoms. Screening includes self-exam, a health care provider exam, and other screening tests. Consult with your health care provider about any symptoms you have or any concerns you have about testicular cancer.  Practice safe sex. Use condoms and avoid high-risk sexual practices to reduce the spread of sexually transmitted infections (STIs).  You should be screened for STIs, including gonorrhea and chlamydia if:  You are sexually active and are younger than 24 years.  You are older than 24 years, and your health care provider tells you that you are at risk for this type of infection.  Your sexual activity has changed since you were last screened, and you are at an increased risk for chlamydia or gonorrhea. Ask your health care provider if you are at risk.  If you are at risk of being infected with HIV, it is recommended that you take a prescription medicine daily to prevent HIV  infection. This is called pre-exposure prophylaxis (PrEP). You are considered at risk if:  You are a man who has sex with other men (MSM).  You are a heterosexual man who is sexually active with multiple partners.  You take drugs by injection.  You are sexually active with a partner who has HIV.  Talk with your health care provider about whether you are at high risk of being infected with HIV. If you choose to begin PrEP, you should first be tested for HIV. You should then be tested every 3 months for as long as you are taking PrEP.  Use sunscreen. Apply sunscreen liberally and repeatedly throughout the day. You should seek shade when your shadow is shorter than you. Protect yourself by wearing long sleeves, pants, a wide-brimmed hat, and sunglasses year round whenever you are outdoors.  Tell your health care provider of new moles or changes in moles, especially if there is a change in shape or color. Also, tell your health care provider if a mole is larger than the size of a pencil eraser.  A one-time screening for abdominal aortic aneurysm (AAA) and surgical repair of large AAAs by ultrasound is recommended for men aged 83-75 years who are current or former smokers.  Stay current with your vaccines (immunizations).   This information is not intended to replace advice given to you by your health care provider. Make sure you discuss any questions you have with your health care provider.   Document Released: 03/19/2008 Document Revised: 10/12/2014 Document Reviewed: 02/16/2011 Elsevier Interactive Patient Education 2016 [place] Directive Advance directives are the legal documents that allow you to make choices about your health care and medical treatment if you cannot speak for yourself. Advance directives are a way for you to communicate your wishes to family, friends, and health care providers. The specified people can then convey your decisions about end-of-life care to  avoid confusion if you should become unable to communicate. Ideally, the process of discussing and writing advance directives should happen over time rather than making decisions all at once. Advance directives can be modified as your situation changes, and you can change your mind at any time, even after you have signed the advance directives. Each state has its own laws regarding advance directives. You may want to check with your health care provider, attorney, or state representative about the law in your state. Below are some examples of  advance directives. LIVING WILL A living will is a set of instructions documenting your wishes about medical care when you cannot care for yourself. It is used if you become:  Terminally ill.  Incapacitated.  Unable to communicate.  Unable to make decisions. Items to consider in your living will include:  The use or non-use of life-sustaining equipment, such as dialysis machines and breathing machines (ventilators).  A do not resuscitate (DNR) order, which is the instruction not to use cardiopulmonary resuscitation (CPR) if breathing or heartbeat stops.  Tube feeding.  Withholding of food and fluids.  Comfort (palliative) care when the goal becomes comfort rather than a cure.  Organ and tissue donation. A living will does not give instructions about distribution of your money and property if you should pass away. It is advisable to seek the expert advice of a lawyer in drawing up a will regarding your possessions. Decisions about taxes, beneficiaries, and asset distribution will be legally binding. This process can relieve your family and friends of any burdens surrounding disputes or questions that may come up about the allocation of your assets. DO NOT RESUSCITATE (DNR) A do not resuscitate (DNR) order is a request to not have CPR in the event that your heart stops beating or you stop breathing. Unless given other instructions, a health care  provider will try to help any patient whose heart has stopped or who has stopped breathing.  HEALTH CARE PROXY AND DURABLE POWER OF ATTORNEY FOR HEALTH CARE A health care proxy is a person (agent) appointed to make medical decisions for you if you cannot. Generally, people choose someone they know well and trust to represent their preferences when they can no longer do so. You should be sure to ask this person for agreement to act as your agent. An agent may have to exercise judgment in the event of a medical decision for which your wishes are not known. The durable power of attorney for health care is the legal document that names your health care proxy. Once written, it should be:  Signed.  Notarized.  Dated.  Copied.  Witnessed.  Incorporated into your medical record. You may also want to appoint someone to manage your financial affairs if you cannot. This is called a durable power of attorney for finances. It is a separate legal document from the durable power of attorney for health care. You may choose the same person or someone different from your health care proxy to act as your agent in financial matters.   This information is not intended to replace advice given to you by your health care provider. Make sure you discuss any questions you have with your health care provider.   Document Released: 12/29/2007 Document Revised: 09/26/2013 Document Reviewed: 02/08/2013 Elsevier Interactive Patient Education 2016 [place] Virus Vaccine Live injection What is this medicine? VARICELLA VIRUS VACCINE (var uh SEL uh VAHY ruhs vak SEEN) is used to prevent infections of chickenpox. HERPES ZOSTER VIRUS VACCINE (HUR peez ZOS ter vahy ruhs vak SEEN) is used to prevent shingles in adults 75 years old and over. This vaccine is not used to treat shingles or nerve pain from shingles. These medicines may be used for other purposes; ask your health care provider or pharmacist if  you have questions. This medicine may be used for other purposes; ask your health care provider or pharmacist if you have questions. What should I tell my health care provider before I take this medicine? They need to  know if you have any of the following conditions: -blood disorders or disease -cancer like leukemia or lymphoma -immune system problems or therapy -infection with fever -recent immune globulin therapy -tuberculosis -an unusual or allergic reaction to vaccines, neomycin, gelatin, other medicines, foods, dyes, or preservatives -pregnant or trying to get pregnant -breast-feeding How should I use this medicine? These vaccines are for injection under the skin. They are given by a health care professional. A copy of Vaccine Information Statements will be given before each varicella virus vaccination. Read this sheet carefully each time. The sheet may change frequently. A Vaccine Information Statement is not given before the herpes zoster virus vaccine. Talk to your pediatrician regarding the use of the varicella virus vaccine in children. While this drug may be prescribed for children as young as 43 months of age for selected conditions, precautions do apply. The herpes zoster virus vaccine is not approved in children. Overdosage: If you think you have taken too much of this medicine contact a poison control center or emergency room at once. NOTE: This medicine is only for you. Do not share this medicine with others. What if I miss a dose? Keep appointments for follow-up (booster) doses of varicella virus vaccine as directed. It is important not to miss your dose. Call your doctor or health care professional if you are unable to keep an appointment. Follow-up (booster) doses are not needed for the herpes zoster virus vaccine. What may interact with this medicine? Do not take these medicines with any of the following  medications: -adalimumab -anakinra -etanercept -infliximab -medicines that suppress your immune system -medicines to treat cancer These medicines may also interact with the following medications: -aspirin and aspirin-like medicines (varicella virus vaccine only) -blood transfusions (varicella virus vaccine only) -immunoglobulins (varicella virus vaccine only) -steroid medicines like prednisone or cortisone This list may not describe all possible interactions. Give your health care provider a list of all the medicines, herbs, non-prescription drugs, or dietary supplements you use. Also tell them if you smoke, drink alcohol, or use illegal drugs. Some items may interact with your medicine. What should I watch for while using this medicine? Visit your doctor for regular check ups. These vaccines, like all vaccines, may not fully protect everyone. After receiving these vaccines it may be possible to pass chickenpox infection to others. For up to 6 weeks, avoid people with immune system problems, pregnant women who have not had chickenpox, newborns of women who have not had chickenpox, and all newborns born at less than 28 weeks of pregnancy. Talk to your doctor for more information. Do not become pregnant for 3 months after taking these vaccines. Women should inform their doctor if they wish to become pregnant or think they might be pregnant. There is a potential for serious side effects to an unborn child. Talk to your health care professional or pharmacist for more information. What side effects may I notice from receiving this medicine? Side effects that you should report to your doctor or health care professional as soon as possible: -allergic reactions like skin rash, itching or hives, swelling of the face, lips, or tongue -breathing problems -extreme changes in behavior -feeling faint or lightheaded, falls -fever over 102 degrees F -pain, tingling, numbness in the hands or feet -redness,  blistering, peeling or loosening of the skin, including inside the mouth -seizures -unusually weak or tired Side effects that usually do not require medical attention (report to your doctor or health care professional if they continue or are  bothersome): -aches or pains -chickenpox-like rash -diarrhea -headache -low-grade fever under 102 degrees F -loss of appetite -nausea, vomiting -redness, pain, swelling at site where injected -sleepy -trouble sleeping This list may not describe all possible side effects. Call your doctor for medical advice about side effects. You may report side effects to FDA at 1-800-FDA-1088. Where should I keep my medicine? These drugs are given in a hospital or clinic and will not be stored at home. NOTE: This sheet is a summary. It may not cover all possible information. If you have questions about this medicine, talk to your doctor, pharmacist, or health care provider.    2016, Elsevier/Gold Standard. (2013-05-26 14:24:35)

## 2016-01-27 NOTE — Assessment & Plan Note (Signed)
Reviewed and updated patient's medical, surgical, family and social history. Medications and allergies were also reviewed. Basic screenings for depression, activities of daily living, hearing, cognition and safety were performed. Provider list was updated and health plan was provided to the patient.  

## 2016-01-27 NOTE — Progress Notes (Signed)
Subjective:    Patient ID: Justin OO, male    DOB: 06/04/1940, 75 y.[place]iffin.   MRN: [id number]  Chief Complaint  Patient presents with  . Establish Care    not fasting, CPE    HPI:  Justin Griffin is a 75 y.[place]iff[place]. male who presents today for an annual wellness visit.   1) Health Maintenance -   Diet - Averages about 3 meals per day consisting of chicken, fish, vegetables, and some fruit; Denies caffeine intake  Exercise - Walking around and car maintenance   2) Preventative Exams / Immunizations:  Dental -- Up to date  Vision -- Up to date    Health Maintenance  Topic Date Due  . TETANUS/TDAP  06/05/1959  . ZOSTAVAX  06/04/2000  . PNA vac Low Risk Adult (1 of 2 - PCV13) 06/04/2005  . INFLUENZA VACCINE  05/05/2016  . COLONOSCOPY  11/09/2023    Immunization History  Administered Date(s) Administered  . Pneumococcal Conjugate-13 01/27/2016  . Tdap 01/27/2016    RISK FACTORS  Tobacco History  Smoking status  . Former Smoker -- 0 years  . Types: Cigarettes  . Quit date: 10/05/1994  Smokeless tobacco  . Never Used     Cardiac risk factors: advanced age (older than 52 for men, 22 for women), dyslipidemia, hypertension and male gender.  Depression Screen  Q1: Over the past two weeks, have you felt down, depressed or hopeless? No  Q2: Over the past two weeks, have you felt little interest or pleasure in doing things? No  Have you lost interest or pleasure in daily life? No  Do you often feel hopeless? No  Do you cry easily over simple problems? No  Activities of Daily Living In your present state of health, do you have any difficulty performing the following activities?:  Driving? No Managing money?  No Feeding yourself? No Getting from bed to chair? No Climbing a flight of stairs? No Preparing food and eating?: No Bathing or showering? No Getting dressed: No Getting to the toilet? No Using the toilet: No Moving around from place to place: No In the  past year have you fallen or had a near fall?:No   Home Safety Has smoke detector and wears seat belts. No excess sun exposure. Are there smokers in your home (other than you)?  No Do you feel safe at home?  Yes  Hearing Difficulties: No Do you often ask people to speak up or repeat themselves? No Do you experience ringing or noises in your ears? No  Do you have difficulty understanding soft or whispered voices? No    Cognitive Testing  Alert? Yes   Normal Appearance? Yes  Oriented to person? No  Place? Yes   Time? Yes  Recall of three objects?  Yes   Can perform simple calculations? Yes  Displays appropriate judgment? Yes  Can read the correct time from a watch face? Yes  Do you feel that you have a problem with memory? No  Do you often misplace items? No   Advanced Directives have been discussed with the patient? Yes   Current Physicians/Providers and Suppliers  1. Terri Piedra, FNP - Internal Medicine 2. Metta Clines, DO - Neurology 3. Minus Breeding, MD - Cardiology 4. Scarlette Shorts, MD - Gastroenterology 5. Julian Reil, MD - Opthalmology  Indicate any recent Medical Services you may have received from other than [facility] providers in the past year (date may be approximate).  All answers were reviewed with  the patient and necessary referrals were made:  Mauricio Po, FNP   01/27/2016    Allergies  Allergen Reactions  . Zocor [Simvastatin]     Muscle pain     Outpatient Prescriptions Prior to Visit  Medication Sig Dispense Refill  . amLODipine (NORVASC) 2.5 MG tablet Take 1 tablet (2.5 mg total) by mouth daily. 30 tablet 9  . aspirin 325 MG tablet Take 325 mg by mouth daily.    Marland Kitchen atorvastatin (LIPITOR) 80 MG tablet take 1 tablet by mouth once daily 30 tablet 6  . benazepril-hydrochlorthiazide (LOTENSIN HCT) 20-12.5 MG tablet take 1 tablet by mouth twice a day 60 tablet 1  . donepezil (ARICEPT) 10 MG tablet Take 1 tablet (10 mg total) by mouth at bedtime. 30 tablet 5   . pantoprazole (PROTONIX) 40 MG tablet Take 1 tablet (40 mg total) by mouth daily. 30 tablet 11  . potassium chloride (K-DUR,KLOR-CON) 10 MEQ tablet take 1 tablet by mouth once daily 90 tablet 0   No facility-administered medications prior to visit.     Past Medical History  Diagnosis Date  . CAD (coronary artery disease)     Catheterization 2006 LAD occluded, OM 95% stenosis followed by mid occlusion, first obtuse marginal occluded, right coronary artery diffuse moderate disease. LIMA to the LAD was patent. Saphenous vein to PDA and posterior lateral patent with diffuse luminal irregularities, saphenous vein graft to second obtuse marginal is patent, saphenous vein graft to the first obtuse marginal had 25% stenosis.  . Other and unspecified hyperlipidemia   . Other dysphagia   . Unspecified essential hypertension   . Esophageal reflux   . Rheumatoid arthritis(714.0)   . Esophageal stricture   . Status post dilation of esophageal narrowing      Past Surgical History  Procedure Laterality Date  . Coronary artery bypass graft  1996  . Left digit amputation       Family History  Problem Relation Age of Onset  . Prostate cancer Paternal Grandfather   . Heart disease Maternal Grandfather   . Heart disease Mother      Social History   Social History  . Marital Status: Widowed    Spouse Name: N/A  . Number of Children: 4  . Years of Education: 10   Occupational History  . RETIRED    Social History Main Topics  . Smoking status: Former Smoker -- 0 years    Types: Cigarettes    Quit date: 10/05/1994  . Smokeless tobacco: Never Used  . Alcohol Use: No  . Drug Use: No  . Sexual Activity: Not on file   Other Topics Concern  . Not on file   Social History Narrative   Denies abuse and feel safe at home.   Fun: Watch TV.      Review of Systems  Constitutional: Denies fever, chills, fatigue, or significant weight gain/loss. HENT: Head: Denies headache or neck  pain Ears: Denies changes in hearing, ringing in ears, earache, drainage Nose: Denies discharge, stuffiness, itching, nosebleed, sinus pain Throat: Denies sore throat, hoarseness, dry mouth, sores, thrush Eyes: Denies loss/changes in vision, pain, redness, blurry/double vision, flashing lights Cardiovascular: Denies chest pain/discomfort, tightness, palpitations, shortness of breath with activity, difficulty lying down, swelling, sudden awakening with shortness of breath Respiratory: Denies shortness of breath, cough, sputum production, wheezing Gastrointestinal: Denies dysphasia, heartburn, change in appetite, nausea, change in bowel habits, rectal bleeding, constipation, diarrhea, yellow skin or eyes Genitourinary: Denies frequency, urgency, burning/pain, blood in urine,  incontinence, change in urinary strength. Musculoskeletal: Denies muscle/joint pain, stiffness, back pain, redness or swelling of joints, trauma Skin: Denies rashes, lumps, itching, dryness, color changes, or hair/nail changes Neurological: Denies dizziness, fainting, seizures, weakness, numbness, tingling, tremor Psychiatric - Denies nervousness, stress, depression or memory loss Endocrine: Denies heat or cold intolerance, sweating, frequent urination, excessive thirst, changes in appetite Hematologic: Denies ease of bruising or bleeding    Objective:     BP 120/80 mmHg  Pulse 79  Temp(Src) 97.9 F (36.6 C) (Oral)  Resp 16  Ht 5' 8.25" (1.734 m)  Wt 190 lb (86.183 kg)  BMI 28.66 kg/m2  SpO2 97% Nursing note and vital signs reviewed.  Physical Exam  Constitutional: He is oriented to person, place, and time. He appears well-developed and well-nourished.  HENT:  Head: Normocephalic.  Right Ear: Hearing, tympanic membrane, external ear and ear canal normal.  Left Ear: Hearing, tympanic membrane, external ear and ear canal normal.  Nose: Nose normal.  Mouth/Throat: Uvula is midline, oropharynx is clear and moist  and mucous membranes are normal.  Eyes: Conjunctivae and EOM are normal. Pupils are equal, round, and reactive to light.  Neck: Neck supple. No JVD present. No tracheal deviation present. No thyromegaly present.  Cardiovascular: Normal rate, regular rhythm, normal heart sounds and intact distal pulses.   Pulmonary/Chest: Effort normal and breath sounds normal.  Abdominal: Soft. Bowel sounds are normal. He exhibits no distension and no mass. There is no tenderness. There is no rebound and no guarding.  Musculoskeletal: Normal range of motion. He exhibits no edema or tenderness.  Lymphadenopathy:    He has no cervical adenopathy.  Neurological: He is alert and oriented to person, place, and time. He has normal reflexes. No cranial nerve deficit. He exhibits normal muscle tone. Coordination normal.  Skin: Skin is warm and dry.  Psychiatric: He has a normal mood and affect. His behavior is normal. Judgment and thought content normal.       Assessment & Plan:   During the course of the visit the patient was educated and counseled about appropriate screening and preventive services including:    Pneumococcal vaccine   Influenza vaccine  Td vaccine  Prostate cancer screening  Colorectal cancer screening  Glaucoma screening  Nutrition counseling   Advanced directives: has NO advanced directive - not interested in additional information  Diet review for nutrition referral? Yes ____  Not Indicated _X___   Patient Instructions (the written plan) was given to the patient.  Medicare Attestation I have personally reviewed: The patient's medical and social history Their use of alcohol, tobacco or illicit drugs Their current medications and supplements The patient's functional ability including ADLs,fall risks, home safety risks, cognitive, and hearing and visual impairment Diet and physical activities Evidence for depression or mood disorders  The patient's weight, height, BMI,   have been recorded in the chart.  I have made referrals, counseling, and provided education to the patient based on review of the above and I have provided the patient with a written personalized care plan for preventive services.     Mauricio Po, [place]   01/27/2016    Problem List Items Addressed This Visit      Other   Routine general medical examination at a health care facility    1) Anticipatory Guidance: Discussed importance of wearing a seatbelt while driving and not texting while driving; changing batteries in smoke detector at least once annually; wearing suntan lotion when outside; eating a balanced and  moderate diet; getting physical activity at least 30 minutes per day.  2) Immunizations / Screenings / Labs:  Tetanus and Prevnar updated today. Potential plan for Zostavax which patient will review information. All other immunizations are up-to-date per recommendations. Obtain PSA for prostate cancer screening. All other screenings are up-to-date per recommendations. Obtain CBC, and CMET.  Overall well exam with risk factors for cardiovascular disease including dyslipidemia, coronary artery disease, and hypertension. Chronic conditions are well maintained with medications with no adverse side effects. Continue current dosage of medications for chronic conditions. Encouraged increasing physical activity to 30 minutes of moderate level activity daily. Otherwise, continue a nutritional intake that is moderate, balance, and varied. Follow-up prevention exam in 1 year. Follow-up office visit for chronic conditions pending blood work.       Relevant Orders   CBC   Comprehensive metabolic panel   PSA   Medicare annual wellness visit, subsequent - Primary    Reviewed and updated patient's medical, surgical, family and social history. Medications and allergies were also reviewed. Basic screenings for depression, activities of daily living, hearing, cognition and safety were performed.  Provider list was updated and health plan was provided to the patient.        Other Visit Diagnoses    Need for vaccination with 13-polyvalent pneumococcal conjugate vaccine        Relevant Orders    Pneumococcal conjugate vaccine 13-valent IM (Completed)    Need for diphtheria-tetanus-pertussis (Tdap) vaccine, adult/adolescent        Relevant Orders    Tdap vaccine greater than or equal to 7yo IM (Completed)

## 2016-01-28 ENCOUNTER — Other Ambulatory Visit: Payer: Self-pay | Admitting: *Deleted

## 2016-01-28 MED ORDER — BENAZEPRIL-HYDROCHLOROTHIAZIDE 20-12.5 MG PO TABS: 1.0000 | ORAL_TABLET | Freq: Two times a day (BID) | ORAL | Status: DC

## 2016-02-10 ENCOUNTER — Other Ambulatory Visit: Payer: Self-pay | Admitting: Cardiology

## 2016-02-10 MED ORDER — ATORVASTATIN CALCIUM 80 MG PO TABS: 80.0000 mg | ORAL_TABLET | Freq: Every day | ORAL | Status: DC

## 2016-02-10 NOTE — Telephone Encounter (Signed)
Rx request sent to pharmacy.  

## 2016-03-05 ENCOUNTER — Other Ambulatory Visit: Payer: Self-pay | Admitting: *Deleted

## 2016-03-05 MED ORDER — BENAZEPRIL-HYDROCHLOROTHIAZIDE 20-25 MG PO TABS: 1.0000 | ORAL_TABLET | Freq: Every day | ORAL | Status: DC

## 2016-03-05 NOTE — Telephone Encounter (Signed)
Benazepril /HCTz 20-12.5 mg twice a day was change to Benazepril/HCTz 20-25 mg daily and send into pt pharmacy

## 2016-03-10 ENCOUNTER — Telehealth: Payer: Self-pay | Admitting: *Deleted

## 2016-03-10 DIAGNOSIS — Z79899 Other long term (current) drug therapy: Secondary | ICD-10-CM

## 2016-03-10 NOTE — Telephone Encounter (Signed)
Tell him to take half of the 20/25 dose.  Check a BMET.

## 2016-03-10 NOTE — Telephone Encounter (Signed)
Spoke with pt daughter letting her know medication was change from Benazepril/HCTZ 20-12.5 mg twice a day to Benazepril/HCTz 20-25 mg daily, pt stated since her dad started taking that dose he have been feeling very dizzy and lightheaded, asked the pt if he's taking the medicine twice or once a day she stated he's taking it daily, she also told her dad to stop taking the medication, told her I will send this to Dr Percival Spanish to advised

## 2016-03-10 NOTE — Telephone Encounter (Signed)
On 03-05-16 the pts benazipril-hctz was called in as 20-25mg . The pt has been taking 20-12.5 twice daily and was not made aware of the change and has been taking 20-25 mg twice daily. The family needs carnification as to what dose the pt should take and how many times daily. Will forward to nya, dr hochrein's cma

## 2016-03-11 NOTE — Telephone Encounter (Signed)
Spoke with pt Daughter Robin(DPR) with instruction to cut Benazepril/HCTZ in half and to get some blood work done, she stated he can get the blood work done Architectural technologist..  Labs order

## 2016-03-13 ENCOUNTER — Other Ambulatory Visit: Payer: Self-pay | Admitting: *Deleted

## 2016-03-13 LAB — BASIC METABOLIC PANEL
BUN: 12 mg/dL (ref 7–25)
CALCIUM: 9.2 mg/dL (ref 8.6–10.3)
CO2: 29 mmol/L (ref 20–31)
CREATININE: 1.03 mg/dL (ref 0.70–1.18)
Chloride: 102 mmol/L (ref 98–110)
Glucose, Bld: 122 mg/dL — ABNORMAL HIGH (ref 65–99)
Potassium: 3.5 mmol/L (ref 3.5–5.3)
SODIUM: 141 mmol/L (ref 135–146)

## 2016-03-13 MED ORDER — POTASSIUM CHLORIDE CRYS ER 10 MEQ PO TBCR: 10.0000 meq | EXTENDED_RELEASE_TABLET | Freq: Every day | ORAL | Status: DC

## 2016-04-01 DIAGNOSIS — H43812 Vitreous degeneration, left eye: Secondary | ICD-10-CM | POA: Diagnosis not present

## 2016-05-08 ENCOUNTER — Telehealth: Payer: Self-pay | Admitting: Family

## 2016-05-08 NOTE — Telephone Encounter (Signed)
Trying to get a [facility] referral and [employer] states "This patient is no longer with [employer] under [employer]. This request will not be processed and will be closed. Please verify the patient's information with their insurance so this request is sent to the correct company." Called and left msg on pt's vm to verify insurance coverage

## 2016-05-20 ENCOUNTER — Other Ambulatory Visit: Payer: Self-pay | Admitting: Cardiology

## 2016-05-20 NOTE — Telephone Encounter (Signed)
Medication Detail    Disp Refills Start End   atorvastatin (LIPITOR) 80 MG tablet 30 tablet 11 02/10/2016    Sig - Route: Take 1 tablet (80 mg total) by mouth daily. - Oral   E-Prescribing Status: Receipt confirmed by pharmacy (02/10/2016 1:43 PM EDT)   Pharmacy   [id number], [place]

## 2016-05-25 NOTE — Telephone Encounter (Signed)
Tried again to contact pt regarding insurance status. LMOVM for pt to call back

## 2016-05-26 ENCOUNTER — Telehealth: Payer: Self-pay

## 2016-05-26 DIAGNOSIS — F0390 Unspecified dementia without behavioral disturbance: Secondary | ICD-10-CM

## 2016-05-26 NOTE — Telephone Encounter (Signed)
Provider asked me to contact pt before upcoming visit to see about MRI and driving evaluation that was not done since last visit. MRI was no showed x 2. Did call patient. Would like to schedule. Pt seemed very confused about who I was and what I was trying to convey to him. Asked if there was anyone else I could speak to/or call. Pt denied. Will try to reschedule with [facility] ([address] [place], [place] [address] ph: [phone] fax: [phone])

## 2016-05-26 NOTE — Telephone Encounter (Signed)
[facility] ([address] [place], [place] [address] ph: [phone] fax: [phone]) to call and schedule patient. Did speak to [facility]

## 2016-05-28 NOTE — Telephone Encounter (Signed)
P.A for exam was sent back. Pt not able to be located. Did review chart and found out the insurance has DOB as [birth year]. Resubmitted with that DOB. Also left message on daughters machine about if he has a completely different insurance or not.

## 2016-06-03 ENCOUNTER — Other Ambulatory Visit: Payer: Commercial Managed Care - HMO

## 2016-06-09 ENCOUNTER — Ambulatory Visit: Payer: Medicare HMO | Admitting: Neurology

## 2016-06-12 ENCOUNTER — Ambulatory Visit (INDEPENDENT_AMBULATORY_CARE_PROVIDER_SITE_OTHER): Payer: Commercial Managed Care - HMO | Admitting: Neurology

## 2016-06-12 ENCOUNTER — Encounter: Payer: Self-pay | Admitting: Neurology

## 2016-06-12 VITALS — BP 128/70 | HR 90 | Ht 68.25 in | Wt 186.6 lb

## 2016-06-12 DIAGNOSIS — F0280 Dementia in other diseases classified elsewhere without behavioral disturbance: Secondary | ICD-10-CM | POA: Diagnosis not present

## 2016-06-12 DIAGNOSIS — G301 Alzheimer's disease with late onset: Secondary | ICD-10-CM

## 2016-06-12 MED ORDER — MEMANTINE HCL-DONEPEZIL HCL 7 & 14 & 21 &28 -10 MG PO C4PK: 28.0000 mg | EXTENDED_RELEASE_CAPSULE | ORAL | 0 refills | Status: DC

## 2016-06-12 MED ORDER — MEMANTINE HCL-DONEPEZIL HCL ER 28-10 MG PO CP24: 28.0000 mg | ORAL_CAPSULE | Freq: Every day | ORAL | 0 refills | Status: DC

## 2016-06-12 NOTE — Progress Notes (Signed)
NEUROLOGY FOLLOW UP OFFICE NOTE  GERAMIAH RENNAKER [id number]  HISTORY OF PRESENT ILLNESS: Justin Griffin is a 75 year old right-handed male with CAD, rheumatoid arthritis and hyperlipidemia who follows up for dementia.  He is accompanied by his daughter who supplements history.  UPDATE: He underwent a dementia workup: Labs from 12/06/15 demonstrated TSH 0.54, nonreactive RPR, B12 470 and folate 21.1 MRI of brain was ordered but not performed. He has not had a formal driving evaluation.  He still drives.  His daughter says he has gotten lost on familiar routes.  He has not had any accidents.  He still pays his bills without difficulty.  His other daughter sees him on Sunday and Monday.  She started to set up a pillbox for him.  He has not had any confusion, hallucinations or change in behavior.  He is taking Aricept 10mg  daily.   HISTORY: Patient reports problems with memory for unknown period of time.  He says that he sometimes gets the date mixed up, including day of week or even year.  He lives by himself and says he manages his finances and performs all house chores without difficulty.  He still drives without issue.  He denies disorientation on familiar routes or near-accidents.  He denies short term memory problems such as forgetting recent conversation or repeating questions.  He denies difficulty remembering names or faces of familiar people.  His daughter lives in [place] and she reportedly has not expressed concern to him.   He did not graduate high school.  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  He denies family history of dementia.  He denies history of alcohol abuse, stroke or head trauma.  PAST MEDICAL HISTORY: Past Medical History:  Diagnosis Date  . CAD (coronary artery disease)    Catheterization 2006 LAD occluded, OM 95% stenosis followed by mid occlusion, first obtuse marginal occluded, right coronary artery diffuse moderate disease. LIMA to the LAD was patent. Saphenous  vein to PDA and posterior lateral patent with diffuse luminal irregularities, saphenous vein graft to second obtuse marginal is patent, saphenous vein graft to the first obtuse marginal had 25% stenosis.  . Esophageal reflux   . Esophageal stricture   . Other and unspecified hyperlipidemia   . Other dysphagia   . Rheumatoid arthritis(714.0)   . Status post dilation of esophageal narrowing   . Unspecified essential hypertension     MEDICATIONS: Current Outpatient Prescriptions on File Prior to Visit  Medication Sig Dispense Refill  . amLODipine (NORVASC) 2.5 MG tablet Take 1 tablet (2.5 mg total) by mouth daily. 30 tablet 9  . aspirin 325 MG tablet Take 325 mg by mouth daily.    Marland Kitchen atorvastatin (LIPITOR) 80 MG tablet Take 1 tablet (80 mg total) by mouth daily. 30 tablet 11  . benazepril-hydrochlorthiazide (LOTENSIN HCT) 20-25 MG tablet Take 1 tablet by mouth daily. 30 tablet 11  . pantoprazole (PROTONIX) 40 MG tablet Take 1 tablet (40 mg total) by mouth daily. 30 tablet 11  . potassium chloride (K-DUR,KLOR-CON) 10 MEQ tablet Take 1 tablet (10 mEq total) by mouth daily. 90 tablet 2   No current facility-administered medications on file prior to visit.     ALLERGIES: Allergies  Allergen Reactions  . Zocor [Simvastatin]     Muscle pain    FAMILY HISTORY: Family History  Problem Relation Age of Onset  . Heart disease Maternal Grandfather   . Prostate cancer Paternal Grandfather   . Heart disease Mother  SOCIAL HISTORY: Social History   Social History  . Marital status: Widowed    Spouse name: N/A  . Number of children: 4  . Years of education: 10   Occupational History  . RETIRED    Social History Main Topics  . Smoking status: Former Smoker    Years: 0.00    Types: Cigarettes    Quit date: 10/05/1994  . Smokeless tobacco: Never Used  . Alcohol use No  . Drug use: No  . Sexual activity: Not on file   Other Topics Concern  . Not on file   Social History  Narrative   Denies abuse and feel safe at home.   Fun: Watch TV.     REVIEW OF SYSTEMS: Constitutional: No fevers, chills, or sweats, no generalized fatigue, change in appetite Eyes: No visual changes, double vision, eye pain Ear, nose and throat: No hearing loss, ear pain, nasal congestion, sore throat Cardiovascular: No chest pain, palpitations Respiratory:  No shortness of breath at rest or with exertion, wheezes GastrointestinaI: No nausea, vomiting, diarrhea, abdominal pain, fecal incontinence Genitourinary:  No dysuria, urinary retention or frequency Musculoskeletal:  No neck pain, back pain Integumentary: No rash, pruritus, skin lesions Neurological: as above Psychiatric: No depression, insomnia, anxiety Endocrine: No palpitations, fatigue, diaphoresis, mood swings, change in appetite, change in weight, increased thirst Hematologic/Lymphatic:  No purpura, petechiae. Allergic/Immunologic: no itchy/runny eyes, nasal congestion, recent allergic reactions, rashes  PHYSICAL EXAM: Vitals:   06/12/16 1144  BP: 128/70  Pulse: 90   General: No acute distress.  Patient appears well-groomed.  normal body habitus. Head:  Normocephalic/atraumatic Eyes:  Fundi examined but not visualized Neck: supple, no paraspinal tenderness, full range of motion Heart:  Regular rate and rhythm Lungs:  Clear to auscultation bilaterally Back: No paraspinal tenderness Neurological Exam: alert and oriented to person and place only. Attention span and concentration fair, recent memory impaired, remote memory intact, fund of knowledge fairly intact.  Speech fluent and not dysarthric, language intact.  CN II-XII intact. Bulk and tone normal, muscle strength 5/5 throughout.  Sensation to light touch  intact.  Deep tendon reflexes 2+ throughout.  IMPRESSION: Alzheimer's disease  PLAN: 1.   Discontinue Aricept.  We will switch to Namzaric starter pack.  2.  To appropriately complete memory workup, will get  MRI of brain 3.  Home health assessment 4.  Daughter should set up and monitor medication 5.  Discussed that daughter should look over finances 6.  Increase exercise, puzzles.  Consider going to a senior community center during the day. 7.  No driving. 8.  Discussed importance in establishing POA. 9.  Follow up in 8 months.  27 minutes spent face to face with patient, over 50% spent counseling.  Metta Clines, DO  CC:  Mauricio Po, FNP

## 2016-06-12 NOTE — Patient Instructions (Addendum)
1.  Stop the donepezil.  Instead, we will start Namzaric, which has donepezil and memantine.  Take the starter pack as prescribed. 2.  No driving.   3.  We will get a home health assessment 4.  Have your daughter set up you medications every week in a pillbox 5.  Set up power of attorney 6.  We will get MRI of brain 7.  Follow up in 8 months.  Alzheimer Disease Caregiver Guide Alzheimer disease is an illness that affects a person's brain. It causes a person to lose the ability to remember things and make good decisions. As the disease progresses, the person is unable to take care of himself or herself and needs more and more help to do simple tasks. Taking care of someone with Alzheimer disease can be very challenging and overwhelming.  MEMORY LOSS AND CONFUSION Memory loss and confusion is mild in the beginning stages of the disease. Both of these problems become more severe as the disease progresses. Eventually, the person will not recognize places or even close family members and friends.   Stay calm.  Respond with a short explanation. Long explanations can be overwhelming and confusing.  Avoid corrections that sound like scolding.  Try not to take it personally, even if the person forgets your name. BEHAVIOR CHANGES Behavior changes are part of the disease. The person may develop depression, anxiety, anger, hallucinations, or other behavior changes. These changes can come on suddenly and may be in response to pain, infection, changes in the environment (temperature, noise), overstimulation, or feeling lost or scared.   Try not to take behavior changes personally.  Remain calm and patient.  Do not argue or try to convince the person about a specific point. This will only make him or her more agitated.  Know that the behavior changes are part of the disease process and try to work through it. TIPS TO REDUCE FRUSTRATION  Schedule wisely by making appointments and doing daily tasks,  like bathing and dressing, when the person is at his or her best.  Take your time. Simple tasks may take a lot longer, so be sure to allow for plenty of time.  Limit choices. Too many choices can be overwhelming and stressful for the person.  Involve the person in what you are doing.  Stick to a routine.  Avoid new or crowded situations, if possible.  Use simple words, short sentences, and a calm voice. Only give one direction at a time.  Buy clothes and shoes that are easy to put on and take off.  Let people help if they offer. HOME SAFETY Keeping the home safe is very important to reduce the risk of falls and injuries.   Keep floors clear of clutter. Remove rugs, magazine racks, and floor lamps.  Keep hallways well lit.  Put a handrail and nonslip mat in the bathtub or shower.  Put childproof locks on cabinets with dangerous items, such as medicine, alcohol, guns, toxic cleaning items, sharp tools or utensils, matches, or lighters.  Place locks on doors where the person cannot easily see or reach them. This helps ensure that the person cannot wander out of the house and get lost.  Be prepared for emergencies. Keep a list of emergency phone numbers and addresses in a convenient area. PLANS FOR THE FUTURE  Do not put off talking about finances.  Talk about money management. People with Alzheimer disease have trouble managing their money as the disease gets worse.  Get help  from professional advisors regarding financial and legal matters.  Do not put off talking about future care.  Choose a power of attorney. This is someone who can make decisions for the person with Alzheimer disease when he or she is no longer able to do so.  Talk about driving and when it is the right time to stop. The person's health care provider can help give advice on this matter.  Talk about the person's living situation. If he or she lives alone, you need to make sure he or she is safe. Some  people need extra help at home, and others need more care at a nursing home or care center. SUPPORT GROUPS Joining a support group can be very helpful for caregivers of people with Alzheimer disease. Some advantages to being part of a support group include:   Getting strategies to manage stress.  Sharing experiences with others.  Receiving emotional comfort and support.  Learning new caregiving skills as the disease progresses.  Knowing what community resources are available and taking advantage of them. SEEK MEDICAL CARE IF:  The person has a fever.  The person has a sudden change in behavior that does not improve with calming strategies.  The person is unable to manage in his or her current living situation.  The person threatens you or anyone else, including himself or herself.  You are no longer able to care for the person.   This information is not intended to replace advice given to you by your health care provider. Make sure you discuss any questions you have with your health care provider.   Document Released: 06/02/2004 Document Revised: 10/12/2014 Document Reviewed: 10/28/2011 Elsevier Interactive Patient Education [employer].

## 2016-06-16 DIAGNOSIS — K219 Gastro-esophageal reflux disease without esophagitis: Secondary | ICD-10-CM | POA: Diagnosis not present

## 2016-06-16 DIAGNOSIS — I2510 Atherosclerotic heart disease of native coronary artery without angina pectoris: Secondary | ICD-10-CM | POA: Diagnosis not present

## 2016-06-16 DIAGNOSIS — M069 Rheumatoid arthritis, unspecified: Secondary | ICD-10-CM | POA: Diagnosis not present

## 2016-06-16 DIAGNOSIS — K222 Esophageal obstruction: Secondary | ICD-10-CM | POA: Diagnosis not present

## 2016-06-16 DIAGNOSIS — Z7982 Long term (current) use of aspirin: Secondary | ICD-10-CM | POA: Diagnosis not present

## 2016-06-16 DIAGNOSIS — G301 Alzheimer's disease with late onset: Secondary | ICD-10-CM | POA: Diagnosis not present

## 2016-06-16 DIAGNOSIS — Z87891 Personal history of nicotine dependence: Secondary | ICD-10-CM | POA: Diagnosis not present

## 2016-06-16 DIAGNOSIS — I10 Essential (primary) hypertension: Secondary | ICD-10-CM | POA: Diagnosis not present

## 2016-06-16 DIAGNOSIS — R1310 Dysphagia, unspecified: Secondary | ICD-10-CM | POA: Diagnosis not present

## 2016-06-16 DIAGNOSIS — E785 Hyperlipidemia, unspecified: Secondary | ICD-10-CM | POA: Diagnosis not present

## 2016-06-22 ENCOUNTER — Ambulatory Visit
Admission: RE | Admit: 2016-06-22 | Discharge: 2016-06-22 | Disposition: A | Discharge status: Home / Self Care | Payer: Medicare Other | Source: Ambulatory Visit | Attending: Neurology | Admitting: Neurology

## 2016-06-22 DIAGNOSIS — F0280 Dementia in other diseases classified elsewhere without behavioral disturbance: Secondary | ICD-10-CM

## 2016-06-22 DIAGNOSIS — G301 Alzheimer's disease with late onset: Principal | ICD-10-CM

## 2016-06-23 DIAGNOSIS — M069 Rheumatoid arthritis, unspecified: Secondary | ICD-10-CM | POA: Diagnosis not present

## 2016-06-23 DIAGNOSIS — I10 Essential (primary) hypertension: Secondary | ICD-10-CM | POA: Diagnosis not present

## 2016-06-23 DIAGNOSIS — K222 Esophageal obstruction: Secondary | ICD-10-CM | POA: Diagnosis not present

## 2016-06-23 DIAGNOSIS — Z7982 Long term (current) use of aspirin: Secondary | ICD-10-CM | POA: Diagnosis not present

## 2016-06-23 DIAGNOSIS — E785 Hyperlipidemia, unspecified: Secondary | ICD-10-CM | POA: Diagnosis not present

## 2016-06-23 DIAGNOSIS — K219 Gastro-esophageal reflux disease without esophagitis: Secondary | ICD-10-CM | POA: Diagnosis not present

## 2016-06-23 DIAGNOSIS — R1310 Dysphagia, unspecified: Secondary | ICD-10-CM | POA: Diagnosis not present

## 2016-06-23 DIAGNOSIS — G301 Alzheimer's disease with late onset: Secondary | ICD-10-CM | POA: Diagnosis not present

## 2016-06-23 DIAGNOSIS — I2510 Atherosclerotic heart disease of native coronary artery without angina pectoris: Secondary | ICD-10-CM | POA: Diagnosis not present

## 2016-06-23 DIAGNOSIS — Z87891 Personal history of nicotine dependence: Secondary | ICD-10-CM | POA: Diagnosis not present

## 2016-06-25 DIAGNOSIS — K222 Esophageal obstruction: Secondary | ICD-10-CM | POA: Diagnosis not present

## 2016-06-25 DIAGNOSIS — E785 Hyperlipidemia, unspecified: Secondary | ICD-10-CM | POA: Diagnosis not present

## 2016-06-25 DIAGNOSIS — Z7982 Long term (current) use of aspirin: Secondary | ICD-10-CM | POA: Diagnosis not present

## 2016-06-25 DIAGNOSIS — G301 Alzheimer's disease with late onset: Secondary | ICD-10-CM | POA: Diagnosis not present

## 2016-06-25 DIAGNOSIS — R1310 Dysphagia, unspecified: Secondary | ICD-10-CM | POA: Diagnosis not present

## 2016-06-25 DIAGNOSIS — I10 Essential (primary) hypertension: Secondary | ICD-10-CM | POA: Diagnosis not present

## 2016-06-25 DIAGNOSIS — M069 Rheumatoid arthritis, unspecified: Secondary | ICD-10-CM | POA: Diagnosis not present

## 2016-06-25 DIAGNOSIS — K219 Gastro-esophageal reflux disease without esophagitis: Secondary | ICD-10-CM | POA: Diagnosis not present

## 2016-06-25 DIAGNOSIS — I2510 Atherosclerotic heart disease of native coronary artery without angina pectoris: Secondary | ICD-10-CM | POA: Diagnosis not present

## 2016-06-25 DIAGNOSIS — Z87891 Personal history of nicotine dependence: Secondary | ICD-10-CM | POA: Diagnosis not present

## 2016-07-01 DIAGNOSIS — I2510 Atherosclerotic heart disease of native coronary artery without angina pectoris: Secondary | ICD-10-CM | POA: Diagnosis not present

## 2016-07-01 DIAGNOSIS — R1310 Dysphagia, unspecified: Secondary | ICD-10-CM | POA: Diagnosis not present

## 2016-07-01 DIAGNOSIS — M069 Rheumatoid arthritis, unspecified: Secondary | ICD-10-CM | POA: Diagnosis not present

## 2016-07-01 DIAGNOSIS — Z7982 Long term (current) use of aspirin: Secondary | ICD-10-CM | POA: Diagnosis not present

## 2016-07-01 DIAGNOSIS — E785 Hyperlipidemia, unspecified: Secondary | ICD-10-CM | POA: Diagnosis not present

## 2016-07-01 DIAGNOSIS — G301 Alzheimer's disease with late onset: Secondary | ICD-10-CM | POA: Diagnosis not present

## 2016-07-01 DIAGNOSIS — Z87891 Personal history of nicotine dependence: Secondary | ICD-10-CM | POA: Diagnosis not present

## 2016-07-01 DIAGNOSIS — K222 Esophageal obstruction: Secondary | ICD-10-CM | POA: Diagnosis not present

## 2016-07-01 DIAGNOSIS — K219 Gastro-esophageal reflux disease without esophagitis: Secondary | ICD-10-CM | POA: Diagnosis not present

## 2016-07-01 DIAGNOSIS — I10 Essential (primary) hypertension: Secondary | ICD-10-CM | POA: Diagnosis not present

## 2016-07-15 DIAGNOSIS — H401112 Primary open-angle glaucoma, right eye, moderate stage: Secondary | ICD-10-CM | POA: Diagnosis not present

## 2016-07-24 ENCOUNTER — Ambulatory Visit (INDEPENDENT_AMBULATORY_CARE_PROVIDER_SITE_OTHER): Payer: Medicare Other | Admitting: Family

## 2016-07-24 VITALS — BP 148/80 | HR 91 | Temp 98.1°F | Resp 16 | Ht 68.25 in | Wt 197.0 lb

## 2016-07-24 DIAGNOSIS — F0280 Dementia in other diseases classified elsewhere without behavioral disturbance: Secondary | ICD-10-CM | POA: Diagnosis not present

## 2016-07-24 DIAGNOSIS — Z23 Encounter for immunization: Secondary | ICD-10-CM | POA: Diagnosis not present

## 2016-07-24 DIAGNOSIS — G301 Alzheimer's disease with late onset: Secondary | ICD-10-CM

## 2016-07-24 NOTE — Progress Notes (Signed)
Subjective:    Patient ID: Justin Griffin, male    DOB: March 10, 1941, 74 y.o.   MRN: [id number]  Chief Complaint  Patient presents with  . Memory Loss    HPI:  Justin Griffin is a 75 y.o. male who  has a past medical history of CAD (coronary artery disease); Esophageal reflux; Esophageal stricture; Other and unspecified hyperlipidemia; Other dysphagia; Rheumatoid arthritis(714.0); Status post dilation of esophageal narrowing; and Unspecified essential hypertension. and presents today for an office follow up:   1.) Late onset Alzheimer's - Continues to experience memory issues and is currently maintained on Namzaric and followed by neurology. Family is wishing to pursue admission to skilled nursing facility as patient continues to experience declines in his memory and function. He is currently living independently and does most of his activities of daily living independently. There are slowly some concerns for safety and he is not currently able to drive or operate a stove. Family presents FL2 form for completion today.    Allergies  Allergen Reactions  . Zocor [Simvastatin]     Muscle pain      Outpatient Medications Prior to Visit  Medication Sig Dispense Refill  . amLODipine (NORVASC) 2.5 MG tablet Take 1 tablet (2.5 mg total) by mouth daily. 30 tablet 9  . aspirin 325 MG tablet Take 325 mg by mouth daily.    Marland Kitchen atorvastatin (LIPITOR) 80 MG tablet Take 1 tablet (80 mg total) by mouth daily. 30 tablet 11  . benazepril-hydrochlorthiazide (LOTENSIN HCT) 20-25 MG tablet Take 1 tablet by mouth daily. 30 tablet 11  . Memantine HCl-Donepezil HCl (NAMZARIC) 28-10 MG CP24 Take 28 mg by mouth daily. Samples of this drug were given to the patient, quantity 42, Lot Number [id number] 42 capsule 0  . Memantine HCl-Donepezil HCl (NAMZARIC) 7 & 14 & 21 &28 -10 MG C4PK Take 28 mg by mouth as directed. 28 each 0  . pantoprazole (PROTONIX) 40 MG tablet Take 1 tablet (40 mg total) by mouth daily. 30 tablet 11   . potassium chloride (K-DUR) 10 MEQ tablet Take 10 mEq by mouth daily.  0  . tadalafil (CIALIS) 20 MG tablet Take by mouth.    . potassium chloride (K-DUR,KLOR-CON) 10 MEQ tablet Take 1 tablet (10 mEq total) by mouth daily. 90 tablet 2   No facility-administered medications prior to visit.      Review of Systems  Constitutional: Negative for chills and fever.  Respiratory: Negative for chest tightness and shortness of breath.   Cardiovascular: Negative for chest pain, palpitations and leg swelling.  Psychiatric/Behavioral: Positive for confusion. Negative for agitation, behavioral problems and dysphoric mood.      Objective:    BP (!) 148/80 (BP Location: Left Arm, Patient Position: Sitting, Cuff Size: Normal)   Pulse 91   Temp 98.1 F (36.7 C) (Oral)   Resp 16   Ht 5' 8.25" (1.734 m)   Wt 197 lb (89.4 kg)   SpO2 96%   BMI 29.73 kg/m  Nursing note and vital signs reviewed.  Physical Exam  Constitutional: He appears well-developed and well-nourished. No distress.  Cardiovascular: Normal rate, regular rhythm, normal heart sounds and intact distal pulses.   Pulmonary/Chest: Effort normal and breath sounds normal.  Neurological: He is alert. He is disoriented.  Skin: Skin is warm and dry.  Psychiatric: He has a normal mood and affect. His behavior is normal. Judgment and thought content normal.       Assessment & Plan:  Problem List Items Addressed This Visit      Nervous and Auditory   Late onset Alzheimer's disease without behavioral disturbance - Primary    Remains confused and appears able to function in the home. FL2 completed. Continue current dosage of Namzaric. Continue follow up and changes per neurology.       Other Visit Diagnoses    Encounter for immunization       Relevant Orders   Flu vaccine HIGH DOSE PF (Completed)       I am having Mr. Gogol maintain his aspirin, pantoprazole, amLODipine, atorvastatin, benazepril-hydrochlorthiazide, potassium  chloride, tadalafil, Memantine HCl-Donepezil HCl, and Memantine HCl-Donepezil HCl.   Follow-up: Return if symptoms worsen or fail to improve.  Mauricio Po, FNP

## 2016-07-24 NOTE — Patient Instructions (Signed)
Thank you for choosing [facility].  SUMMARY AND INSTRUCTIONS:  Please continue to take your medication as prescribed.   Follow up:  If your symptoms worsen or fail to improve, please contact our office for further instruction, or in case of emergency go directly to the emergency room at the closest medical facility.

## 2016-07-24 NOTE — Assessment & Plan Note (Signed)
Remains confused and appears able to function in the home. FL2 completed. Continue current dosage of Namzaric. Continue follow up and changes per neurology.

## 2016-08-02 ENCOUNTER — Other Ambulatory Visit: Payer: Self-pay | Admitting: Physician Assistant

## 2016-08-07 ENCOUNTER — Encounter: Payer: Self-pay | Admitting: Family

## 2016-08-07 ENCOUNTER — Ambulatory Visit (INDEPENDENT_AMBULATORY_CARE_PROVIDER_SITE_OTHER): Payer: Medicare Other | Admitting: Family

## 2016-08-07 DIAGNOSIS — N50812 Left testicular pain: Secondary | ICD-10-CM | POA: Diagnosis not present

## 2016-08-07 DIAGNOSIS — K649 Unspecified hemorrhoids: Secondary | ICD-10-CM | POA: Diagnosis not present

## 2016-08-07 MED ORDER — HYDROCORTISONE 2.5 % RE CREA
1.0000 | TOPICAL_CREAM | Freq: Two times a day (BID) | RECTAL | 0 refills | Status: DC
Start: 2016-08-07 — End: 2017-03-31

## 2016-08-07 NOTE — Assessment & Plan Note (Signed)
Testicular exam with no significant findings or pain being elicited. No further treatment or assessment necessary at this time. Continue to monitor him perform self testicular exams.

## 2016-08-07 NOTE — Progress Notes (Signed)
Subjective:    Patient ID: Justin Griffin, male    DOB: 1941/01/08, 75 y.o.   MRN: [id number]  Chief Complaint  Patient presents with  . Testicle Pain    HPI:  Justin Griffin is a 75 y.o. male who  has a past medical history of CAD (coronary artery disease); Esophageal reflux; Esophageal stricture; Other and unspecified hyperlipidemia; Other dysphagia; Rheumatoid arthritis(714.0); Status post dilation of esophageal narrowing; and Unspecified essential hypertension. and presents today for an acute office visit.  Testicular pain - this is a new problem. Associated symptom of pain located in his left testicle has been going on for about 2 days and has gradually improved since initial onset. Denies any treatment or no modifying factors that make it better. No trauma or injury that he can recall. Aggravated on occasion when he sits down. No masses or rashes. Described as "just hurtin."    Allergies  Allergen Reactions  . Zocor [Simvastatin]     Muscle pain      Outpatient Medications Prior to Visit  Medication Sig Dispense Refill  . amLODipine (NORVASC) 2.5 MG tablet Take 1 tablet (2.5 mg total) by mouth daily. 30 tablet 9  . aspirin 325 MG tablet Take 325 mg by mouth daily.    Marland Kitchen atorvastatin (LIPITOR) 80 MG tablet Take 1 tablet (80 mg total) by mouth daily. 30 tablet 11  . benazepril-hydrochlorthiazide (LOTENSIN HCT) 20-25 MG tablet Take 1 tablet by mouth daily. 30 tablet 11  . Memantine HCl-Donepezil HCl (NAMZARIC) 28-10 MG CP24 Take 28 mg by mouth daily. Samples of this drug were given to the patient, quantity 42, Lot Number [id number] 42 capsule 0  . pantoprazole (PROTONIX) 40 MG tablet Take 1 tablet (40 mg total) by mouth daily. 30 tablet 6  . potassium chloride (K-DUR) 10 MEQ tablet Take 10 mEq by mouth daily.  0  . tadalafil (CIALIS) 20 MG tablet Take by mouth.    . Memantine HCl-Donepezil HCl (NAMZARIC) 7 & 14 & 21 &28 -10 MG C4PK Take 28 mg by mouth as directed. 28 each 0   No  facility-administered medications prior to visit.       Past Surgical History:  Procedure Laterality Date  . CORONARY ARTERY BYPASS GRAFT  1996  . left digit amputation        Past Medical History:  Diagnosis Date  . CAD (coronary artery disease)    Catheterization 2006 LAD occluded, OM 95% stenosis followed by mid occlusion, first obtuse marginal occluded, right coronary artery diffuse moderate disease. LIMA to the LAD was patent. Saphenous vein to PDA and posterior lateral patent with diffuse luminal irregularities, saphenous vein graft to second obtuse marginal is patent, saphenous vein graft to the first obtuse marginal had 25% stenosis.  . Esophageal reflux   . Esophageal stricture   . Other and unspecified hyperlipidemia   . Other dysphagia   . Rheumatoid arthritis(714.0)   . Status post dilation of esophageal narrowing   . Unspecified essential hypertension       Review of Systems  Constitutional: Negative for chills and fever.  Genitourinary: Positive for testicular pain. Negative for dysuria, frequency, hematuria, penile pain, penile swelling, scrotal swelling and urgency.      Objective:    BP 138/88 (BP Location: Left Arm, Patient Position: Sitting, Cuff Size: Normal)   Pulse 75   Temp 98.4 F (36.9 C) (Oral)   Ht 5' 8.25" (1.734 m)   Wt 193 lb 4 oz (87.7  kg)   SpO2 97%   BMI 29.17 kg/m  Nursing note and vital signs reviewed.  Physical Exam  Constitutional: He is oriented to person, place, and time. He appears well-developed and well-nourished. No distress.  Cardiovascular: Normal rate, regular rhythm, normal heart sounds and intact distal pulses.   Pulmonary/Chest: Effort normal and breath sounds normal.  Abdominal: Hernia confirmed negative in the right inguinal area and confirmed negative in the left inguinal area.  Genitourinary: Testes normal and penis normal. Rectal exam shows external hemorrhoid and internal hemorrhoid. Cremasteric reflex is  present. Right testis shows no mass, no swelling and no tenderness. Right testis is descended. Cremasteric reflex is not absent on the right side. Left testis shows no mass, no swelling and no tenderness. Left testis is descended. Cremasteric reflex is not absent on the left side.  Neurological: He is alert and oriented to person, place, and time.  Skin: Skin is warm and dry. No rash noted.  Psychiatric: He has a normal mood and affect. His behavior is normal. Judgment and thought content normal.       Assessment & Plan:   Problem List Items Addressed This Visit      Cardiovascular and Mediastinum   Hemorrhoid    Symptoms and exam consistent with hemorrhoids that appear inflamed without thrombosis. Start hydrocortisone rectal cream. Discussed importance of avoiding constipation. Follow-up if symptoms worsen or do not improve.      Relevant Medications   hydrocortisone (ANUSOL-HC) 2.5 % rectal cream     Other   Testicular pain, left    Testicular exam with no significant findings or pain being elicited. No further treatment or assessment necessary at this time. Continue to monitor him perform self testicular exams.       Other Visit Diagnoses   None.      I am having Mr. Cunigan start on hydrocortisone. I am also having him maintain his aspirin, amLODipine, atorvastatin, benazepril-hydrochlorthiazide, potassium chloride, tadalafil, Memantine HCl-Donepezil HCl, and pantoprazole.   Meds ordered this encounter  Medications  . hydrocortisone (ANUSOL-HC) 2.5 % rectal cream    Sig: Place 1 application rectally 2 (two) times daily.    Dispense:  30 g    Refill:  0    Order Specific Question:   Supervising Provider    Answer:   Pricilla Holm A [id number]     Follow-up: Return if symptoms worsen or fail to improve.  Mauricio Po, FNP

## 2016-08-07 NOTE — Progress Notes (Signed)
Pre visit review using our clinic review tool, if applicable. No additional management support is needed unless otherwise documented below in the visit note. 

## 2016-08-07 NOTE — Patient Instructions (Signed)
Thank you for choosing [facility].  SUMMARY AND INSTRUCTIONS:  Medication:  Your prescription(s) have been submitted to your pharmacy or been printed and provided for you. Please take as directed and contact our office if you believe you are having problem(s) with the medication(s) or have any questions.   Follow up:  If your symptoms worsen or fail to improve, please contact our office for further instruction, or in case of emergency go directly to the emergency room at the closest medical facility.   Hemorrhoids Hemorrhoids are swollen veins around the rectum or anus. There are two types of hemorrhoids:   Internal hemorrhoids. These occur in the veins just inside the rectum. They may poke through to the outside and become irritated and painful.  External hemorrhoids. These occur in the veins outside the anus and can be felt as a painful swelling or hard lump near the anus. CAUSES  Pregnancy.   Obesity.   Constipation or diarrhea.   Straining to have a bowel movement.   Sitting for long periods on the toilet.  Heavy lifting or other activity that caused you to strain.  Anal intercourse. SYMPTOMS   Pain.   Anal itching or irritation.   Rectal bleeding.   Fecal leakage.   Anal swelling.   One or more lumps around the anus.  DIAGNOSIS  Your caregiver may be able to diagnose hemorrhoids by visual examination. Other examinations or tests that may be performed include:   Examination of the rectal area with a gloved hand (digital rectal exam).   Examination of anal canal using a small tube (scope).   A blood test if you have lost a significant amount of blood.  A test to look inside the colon (sigmoidoscopy or colonoscopy). TREATMENT Most hemorrhoids can be treated at home. However, if symptoms do not seem to be getting better or if you have a lot of rectal bleeding, your caregiver may perform a procedure to help make the hemorrhoids get smaller  or remove them completely. Possible treatments include:   Placing a rubber band at the base of the hemorrhoid to cut off the circulation (rubber band ligation).   Injecting a chemical to shrink the hemorrhoid (sclerotherapy).   Using a tool to burn the hemorrhoid (infrared light therapy).   Surgically removing the hemorrhoid (hemorrhoidectomy).   Stapling the hemorrhoid to block blood flow to the tissue (hemorrhoid stapling).  HOME CARE INSTRUCTIONS   Eat foods with fiber, such as whole grains, beans, nuts, fruits, and vegetables. Ask your doctor about taking products with added fiber in them (fibersupplements).  Increase fluid intake. Drink enough water and fluids to keep your urine clear or pale yellow.   Exercise regularly.   Go to the bathroom when you have the urge to have a bowel movement. Do not wait.   Avoid straining to have bowel movements.   Keep the anal area dry and clean. Use wet toilet paper or moist towelettes after a bowel movement.   Medicated creams and suppositories may be used or applied as directed.   Only take over-the-counter or prescription medicines as directed by your caregiver.   Take warm sitz baths for 15-20 minutes, 3-4 times a day to ease pain and discomfort.   Place ice packs on the hemorrhoids if they are tender and swollen. Using ice packs between sitz baths may be helpful.   Put ice in a plastic bag.   Place a towel between your skin and the bag.   Leave the  ice on for 15-20 minutes, 3-4 times a day.   Do not use a donut-shaped pillow or sit on the toilet for long periods. This increases blood pooling and pain.  SEEK MEDICAL CARE IF:  You have increasing pain and swelling that is not controlled by treatment or medicine.  You have uncontrolled bleeding.  You have difficulty or you are unable to have a bowel movement.  You have pain or inflammation outside the area of the hemorrhoids. MAKE SURE YOU:  Understand  these instructions.  Will watch your condition.  Will get help right away if you are not doing well or get worse.   This information is not intended to replace advice given to you by your health care provider. Make sure you discuss any questions you have with your health care provider.   Document Released: 09/18/2000 Document Revised: 09/07/2012 Document Reviewed: 07/26/2012 Elsevier Interactive Patient Education [employer].

## 2016-08-07 NOTE — Assessment & Plan Note (Signed)
Symptoms and exam consistent with hemorrhoids that appear inflamed without thrombosis. Start hydrocortisone rectal cream. Discussed importance of avoiding constipation. Follow-up if symptoms worsen or do not improve.

## 2016-09-06 ENCOUNTER — Other Ambulatory Visit: Payer: Self-pay | Admitting: Neurology

## 2016-10-06 ENCOUNTER — Encounter: Payer: Self-pay | Admitting: Family

## 2016-10-06 ENCOUNTER — Ambulatory Visit (INDEPENDENT_AMBULATORY_CARE_PROVIDER_SITE_OTHER): Payer: Medicare Other | Admitting: Family

## 2016-10-06 VITALS — BP 162/80 | HR 78 | Temp 98.5°F | Resp 16 | Ht 68.25 in | Wt 200.8 lb

## 2016-10-06 DIAGNOSIS — N50812 Left testicular pain: Secondary | ICD-10-CM

## 2016-10-06 NOTE — Assessment & Plan Note (Signed)
Left testicular pain with no significant findings on exam and no trauma. Obtain ultrasound and doppler. No red flags in history or exam. Continue to monitor and treat with OTC medication as needed pending [place] results.

## 2016-10-06 NOTE — Patient Instructions (Signed)
Thank you for choosing [facility].  SUMMARY AND INSTRUCTIONS:  Medication:  Please continue to take your medications as prescribed.   They will call to schedule your ultrasound.   Imaging / Radiology:  Please stop by radiology on the basement level of the building for your x-rays. Your results will be released to [facility] (or called to you) after review, usually within 72 hours after test completion. If any treatments or changes are necessary, you will be notified at that same time.  Follow up:  If your symptoms worsen or fail to improve, please contact our office for further instruction, or in case of emergency go directly to the emergency room at the closest medical facility.

## 2016-10-06 NOTE — Progress Notes (Signed)
Subjective:    Patient ID: Justin Griffin, male    DOB: 1941-06-14, 76 y.o.   MRN: [id number]  Chief Complaint  Patient presents with  . Groin Pain    having pain in groin states is the same pain as previous, says the pain has gotten a little worse over time    HPI:  Justin Griffin is a 76 y.o. male who  has a past medical history of CAD (coronary artery disease); Esophageal reflux; Esophageal stricture; Other and unspecified hyperlipidemia; Other dysphagia; Rheumatoid arthritis(714.0); Status post dilation of esophageal narrowing; and Unspecified essential hypertension. and presents today for an office visit. 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   Continues to experience the associated symptom of pain located in his left groin since his previous visit about 2 months ago. Previous exam with no significant findings. Indicates that it has gotten slowly worse since initial onset. Denies any trauma or injury to the area. Denies any modifying factors or attempted treatments to make it better or worse.  Allergies  Allergen Reactions  . Zocor [Simvastatin]     Muscle pain      Outpatient Medications Prior to Visit  Medication Sig Dispense Refill  . amLODipine (NORVASC) 2.5 MG tablet Take 1 tablet (2.5 mg total) by mouth daily. 30 tablet 9  . aspirin 325 MG tablet Take 325 mg by mouth daily.    Marland Kitchen atorvastatin (LIPITOR) 80 MG tablet Take 1 tablet (80 mg total) by mouth daily. 30 tablet 11  . benazepril-hydrochlorthiazide (LOTENSIN HCT) 20-25 MG tablet Take 1 tablet by mouth daily. 30 tablet 11  . hydrocortisone (ANUSOL-HC) 2.5 % rectal cream Place 1 application rectally 2 (two) times daily. 30 g 0  . NAMZARIC 28-10 MG CP24 take 1 tablet by mouth once daily 42 capsule 0  . pantoprazole (PROTONIX) 40 MG tablet Take 1 tablet (40 mg total) by mouth daily. 30 tablet 6  . potassium chloride (K-DUR) 10 MEQ tablet Take 10 mEq by  mouth daily.  0  . tadalafil (CIALIS) 20 MG tablet Take by mouth.     No facility-administered medications prior to visit.      Review of Systems  Constitutional: Negative for chills and fever.  Genitourinary: Positive for testicular pain. Negative for decreased urine volume, difficulty urinating, discharge, flank pain, frequency, hematuria, penile pain, penile swelling, scrotal swelling and urgency.      Objective:    BP (!) 162/80 (BP Location: Left Arm, Patient Position: Sitting, Cuff Size: Large)   Pulse 78   Temp 98.5 F (36.9 C) (Oral)   Resp 16   Ht 5' 8.25" (1.734 m)   Wt 200 lb 12.8 oz (91.1 kg)   SpO2 98%   BMI 30.31 kg/m  Nursing note and vital signs reviewed.  Physical Exam  Constitutional: He is oriented to person, place, and time. He appears well-developed and well-nourished. No distress.  Cardiovascular: Normal rate, regular rhythm, normal heart sounds and intact distal pulses.   Pulmonary/Chest: Effort normal and breath sounds normal.  Abdominal: Hernia confirmed negative in the left inguinal area.  Genitourinary: Right testis shows no mass, no swelling and no tenderness. Right testis is descended. Cremasteric reflex is not absent on the right side. Left testis shows tenderness. Left testis shows no mass and no swelling. Left testis is descended. Cremasteric reflex is not absent on the left side. No hypospadias, penile erythema or penile tenderness. No discharge found.  Lymphadenopathy:  Left: No inguinal adenopathy present.  Neurological: He is alert and oriented to person, place, and time.  Skin: Skin is warm and dry.  Psychiatric: He has a normal mood and affect. His behavior is normal. Judgment and thought content normal.       Assessment & Plan:   Problem List Items Addressed This Visit      Other   Testicular pain, left - Primary    Left testicular pain with no significant findings on exam and no trauma. Obtain ultrasound and doppler. No red  flags in history or exam. Continue to monitor and treat with OTC medication as needed pending [place] results.       Relevant Orders   US Scrotum   US Art/Ven Flow Abd Pelv Doppler       I am having Mr. Ripoll maintain his aspirin, amLODipine, atorvastatin, benazepril-hydrochlorthiazide, potassium chloride, tadalafil, pantoprazole, hydrocortisone, and NAMZARIC.   Follow-up: Return if symptoms worsen or fail to improve.  Mauricio Po, FNP

## 2016-10-09 ENCOUNTER — Ambulatory Visit
Admission: RE | Admit: 2016-10-09 | Discharge: 2016-10-09 | Disposition: A | Discharge status: Home / Self Care | Payer: Medicare Other | Source: Ambulatory Visit | Attending: Family | Admitting: Family

## 2016-10-09 ENCOUNTER — Other Ambulatory Visit: Payer: Self-pay | Admitting: Family

## 2016-10-09 DIAGNOSIS — N50812 Left testicular pain: Secondary | ICD-10-CM

## 2016-10-09 DIAGNOSIS — N503 Cyst of epididymis: Secondary | ICD-10-CM | POA: Diagnosis not present

## 2016-10-25 ENCOUNTER — Other Ambulatory Visit: Payer: Self-pay | Admitting: Neurology

## 2016-12-06 ENCOUNTER — Other Ambulatory Visit: Payer: Self-pay | Admitting: Neurology

## 2016-12-06 ENCOUNTER — Other Ambulatory Visit: Payer: Self-pay | Admitting: Cardiology

## 2016-12-07 NOTE — Telephone Encounter (Signed)
  Last routine OV: 06/12/16 Next OV: 02/09/17

## 2016-12-24 ENCOUNTER — Ambulatory Visit (INDEPENDENT_AMBULATORY_CARE_PROVIDER_SITE_OTHER): Payer: Medicare Other | Admitting: Family

## 2016-12-24 ENCOUNTER — Encounter: Payer: Self-pay | Admitting: Family

## 2016-12-24 DIAGNOSIS — K4090 Unilateral inguinal hernia, without obstruction or gangrene, not specified as recurrent: Secondary | ICD-10-CM | POA: Diagnosis not present

## 2016-12-24 NOTE — Patient Instructions (Addendum)
Thank you for choosing [facility].  SUMMARY AND INSTRUCTIONS:  They will call to schedule your appointment with general surgery.  Tylenol as needed for discomfort.   Avoid heavy lifting as able.   Follow up:  If your symptoms worsen or fail to improve, please contact our office for further instruction, or in case of emergency go directly to the emergency room at the closest medical facility.     Inguinal Hernia, Adult An inguinal hernia is when fat or the intestines push through the area where the leg meets the lower belly (groin) and make a rounded lump (bulge). This condition happens over time. There are three types of inguinal hernias. These types include:  Hernias that can be pushed back into the belly (are reducible).  Hernias that cannot be pushed back into the belly (are incarcerated).  Hernias that cannot be pushed back into the belly and lose their blood supply (get strangulated). This type needs emergency surgery. Follow these instructions at home: Lifestyle   Drink enough fluid to keep your urine (pee) clear or pale yellow.  Eat plenty of fruits, vegetables, and whole grains. These have a lot of fiber. Talk with your doctor if you have questions.  Avoid lifting heavy objects.  Avoid standing for long periods of time.  Do not use tobacco products. These include cigarettes, chewing tobacco, or e-cigarettes. If you need help quitting, ask your doctor.  Try to stay at a healthy weight. General instructions   Do not try to force the hernia back in.  Watch your hernia for any changes in color or size. Let your doctor know if there are any changes.  Take over-the-counter and prescription medicines only as told by your doctor.  Keep all follow-up visits as told by your doctor. This is important. Contact a doctor if:  You have a fever.  You have new symptoms.  Your symptoms get worse. Get help right away if:  The area where the legs meets the lower  belly has:  Pain that gets worse suddenly.  A bulge that gets bigger suddenly and does not go down.  A bulge that turns red or purple.  A bulge that is painful to the touch.  You are a man and your scrotum:  Suddenly feels painful.  Suddenly changes in size.  You feel sick to your stomach (nauseous) and this feeling does not go away.  You throw up (vomit) and this keeps happening.  You feel your heart beating a lot more quickly than normal.  You cannot poop (have a bowel movement) or pass gas. This information is not intended to replace advice given to you by your health care provider. Make sure you discuss any questions you have with your health care provider. Document Released: 10/22/2006 Document Revised: 02/27/2016 Document Reviewed: 08/01/2014 Elsevier Interactive Patient Education  2017 [employer].

## 2016-12-24 NOTE — Assessment & Plan Note (Signed)
Symptoms and exam are consistent with inguinal hernia that is non-strangulated with no evidence of gangrene. Given increased levels of pain refer to general surgery for further assessment and treatment. Continue to monitor and follow up if symptoms worsen or do not improve.

## 2016-12-24 NOTE — Progress Notes (Signed)
Subjective:    Patient ID: Justin Griffin, male    DOB: 05-Mar-1941, 76 y.o.   MRN: [id number]  Chief Complaint  Patient presents with  . Groin Pain    right inner thigh pain close to groin area, x1 week    HPI:  Justin Griffin is a 76 y.o. male who  has a past medical history of CAD (coronary artery disease); Esophageal reflux; Esophageal stricture; Other and unspecified hyperlipidemia; Other dysphagia; Rheumatoid arthritis(714.0); Status post dilation of esophageal narrowing; and Unspecified essential hypertension. and presents today for an acute office visit.   This is a new problem. Associated symptom of pain located in his inner thigh and close to his groin has been going on for about 1 week. Pain is described as just hurting. Denies any trauma or injury. Denies any modifying factors or attempted treatments. Course of the symptoms has stayed about the same since initial onset.   Allergies  Allergen Reactions  . Zocor [Simvastatin]     Muscle pain      Outpatient Medications Prior to Visit  Medication Sig Dispense Refill  . amLODipine (NORVASC) 2.5 MG tablet Take 1 tablet (2.5 mg total) by mouth daily. 30 tablet 9  . aspirin 325 MG tablet Take 325 mg by mouth daily.    Marland Kitchen atorvastatin (LIPITOR) 80 MG tablet Take 1 tablet (80 mg total) by mouth daily. 30 tablet 11  . benazepril-hydrochlorthiazide (LOTENSIN HCT) 20-25 MG tablet Take 1 tablet by mouth daily. 30 tablet 11  . hydrocortisone (ANUSOL-HC) 2.5 % rectal cream Place 1 application rectally 2 (two) times daily. 30 g 0  . Memantine HCl-Donepezil HCl (NAMZARIC) 28-10 MG CP24 Take 1 capsule by mouth daily. 30 capsule 3  . pantoprazole (PROTONIX) 40 MG tablet Take 1 tablet (40 mg total) by mouth daily. 30 tablet 6  . potassium chloride (K-DUR) 10 MEQ tablet Take 10 mEq by mouth daily.  0  . potassium chloride (K-DUR,KLOR-CON) 10 MEQ tablet take 1 tablet by mouth once daily 30 tablet 0  . tadalafil (CIALIS) 20 MG tablet Take by  mouth.     No facility-administered medications prior to visit.     Review of Systems  Constitutional: Negative for chills and fever.  Genitourinary: Negative for discharge, dysuria, flank pain, hematuria, penile pain, penile swelling, scrotal swelling, testicular pain and urgency.      Objective:    BP 132/80 (BP Location: Left Arm, Patient Position: Sitting, Cuff Size: Large)   Pulse 86   Temp 98.3 F (36.8 C) (Oral)   Resp 16   Ht 5' 8.25" (1.734 m)   Wt 199 lb (90.3 kg)   SpO2 95%   BMI 30.04 kg/m  Nursing note and vital signs reviewed.  Physical Exam  Constitutional: He is oriented to person, place, and time. He appears well-developed and well-nourished. No distress.  Cardiovascular: Normal rate, regular rhythm, normal heart sounds and intact distal pulses.   Pulmonary/Chest: Effort normal and breath sounds normal.  Abdominal: A hernia is present. Hernia confirmed positive in the right inguinal area.  Neurological: He is alert and oriented to person, place, and time.  Skin: Skin is warm and dry.  Psychiatric: He has a normal mood and affect. His behavior is normal. Judgment and thought content normal.       Assessment & Plan:   Problem List Items Addressed This Visit      Other   Inguinal hernia    Symptoms and exam are consistent with  inguinal hernia that is non-strangulated with no evidence of gangrene. Given increased levels of pain refer to general surgery for further assessment and treatment. Continue to monitor and follow up if symptoms worsen or do not improve.       Relevant Orders   Ambulatory referral to General Surgery       I am having Mr. Panas maintain his aspirin, amLODipine, atorvastatin, benazepril-hydrochlorthiazide, potassium chloride, tadalafil, pantoprazole, hydrocortisone, potassium chloride, and Memantine HCl-Donepezil HCl.   Follow-up: Return if symptoms worsen or fail to improve.  Mauricio Po, FNP

## 2017-01-15 IMAGING — RF DG ESOPHAGUS
14 of 24 series · 14 of 24 positions shown · non-contrast
Comparison: None.

CLINICAL DATA: Dysphagia and odynophagia. History of prior
esophageal dilatation 3 years ago.

EXAM:
ESOPHOGRAM / BARIUM SWALLOW / BARIUM TABLET STUDY
TECHNIQUE: Combined double contrast and single contrast examination performed
using effervescent crystals, thick barium liquid, and thin barium
liquid. The patient was observed with fluoroscopy swallowing a 13 mm
barium sulphate tablet.
FLUOROSCOPY TIME:  Radiation Exposure Index (as provided by the
fluoroscopic device):
If the device does not provide the exposure index:
Fluoroscopy Time:  3 minutes, 3 seconds.
Number of Acquired Images:  None.

[Series 1: run · 1 of 6 slices shown (1 of 14)]
[im 1/6]
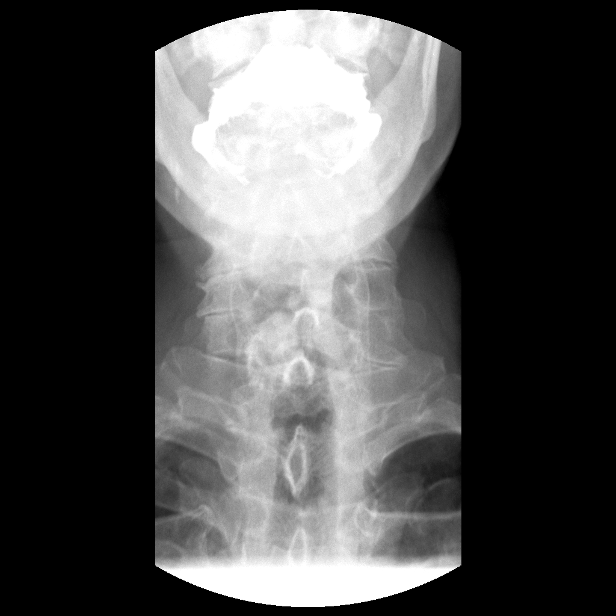

[Series 3: run · 1 of 1 slices shown (2 of 14)]
[im 1/1]
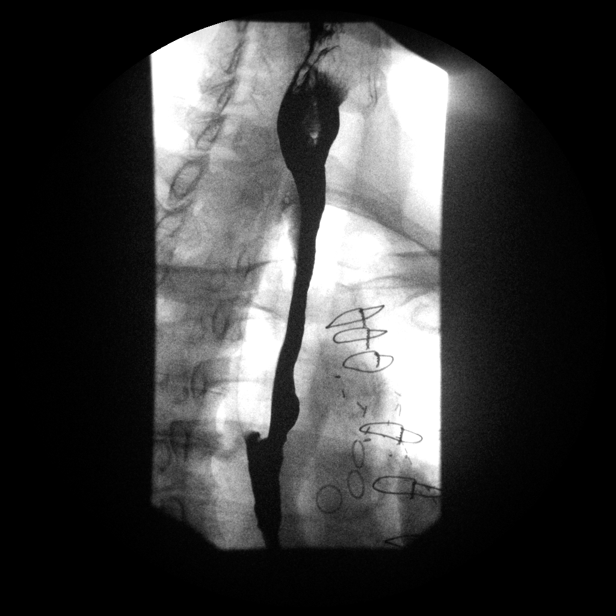

[Series 5: run · 1 of 1 slices shown (3 of 14)]
[im 1/1]
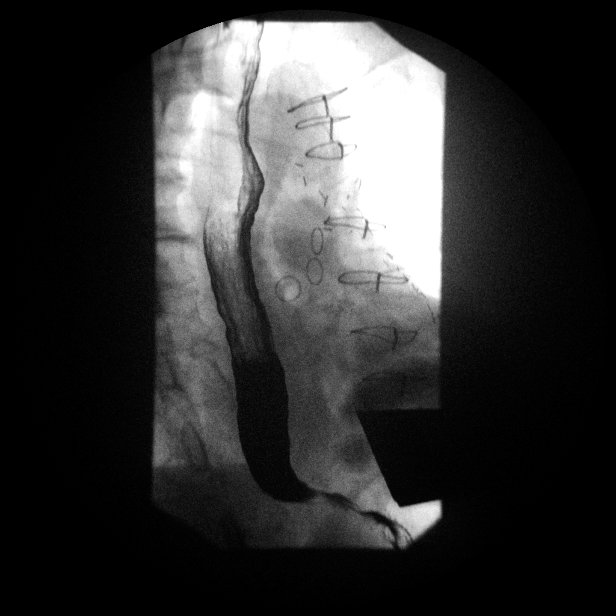

[Series 7: run · 1 of 1 slices shown (4 of 14)]
[im 1/1]
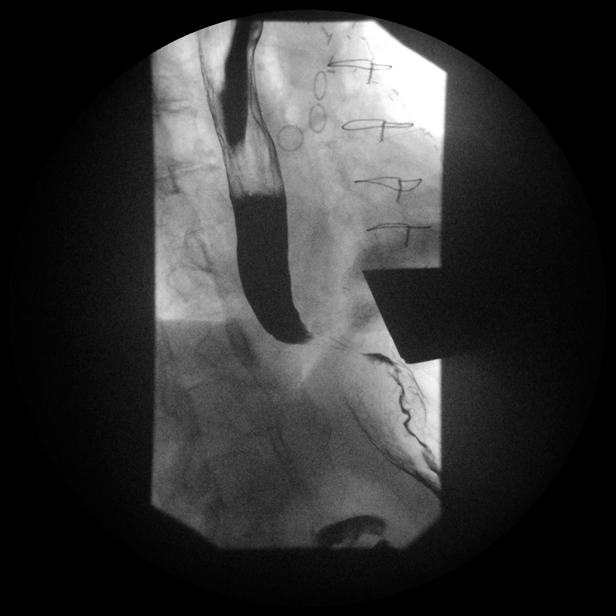

[Series 8: run · 1 of 1 slices shown (5 of 14)]
[im 1/1]
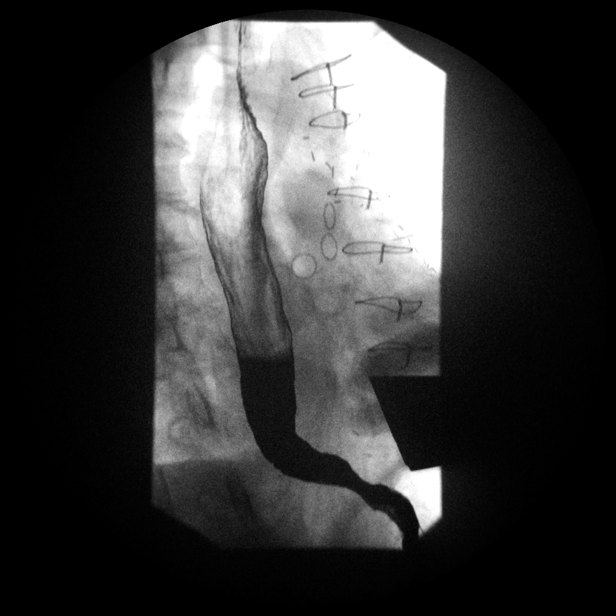

[Series 10: run · 1 of 1 slices shown (6 of 14)]
[im 1/1]
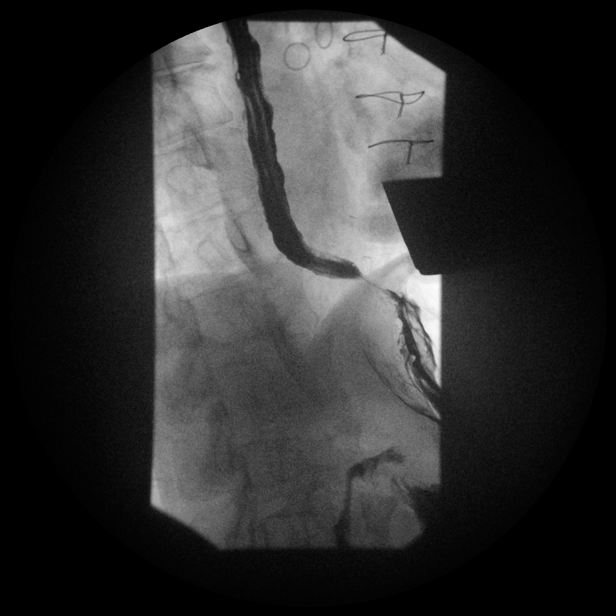

[Series 12: run · 1 of 1 slices shown (7 of 14)]
[im 1/1]
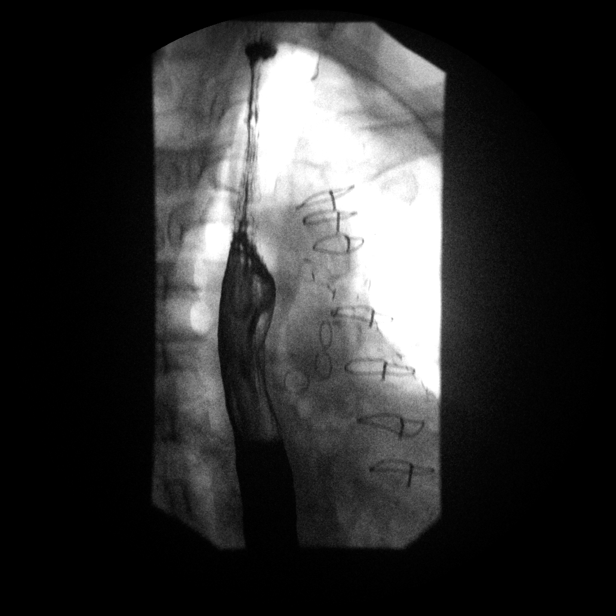

[Series 13: run · 1 of 1 slices shown (8 of 14)]
[im 1/1]
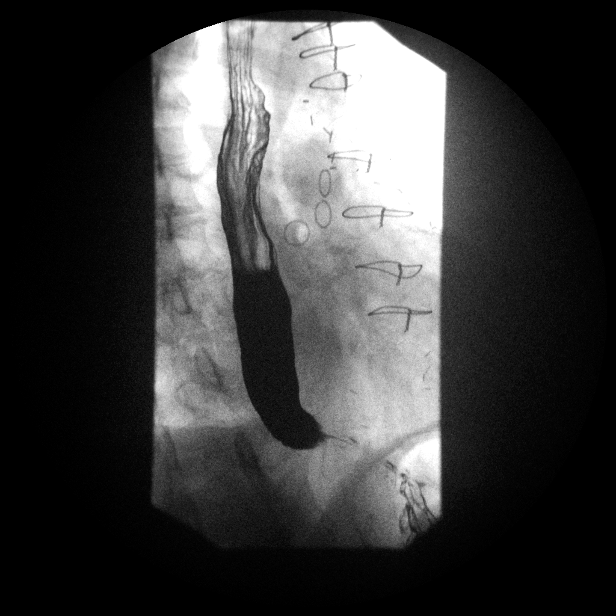

[Series 15: run · 1 of 1 slices shown (9 of 14)]
[im 1/1]
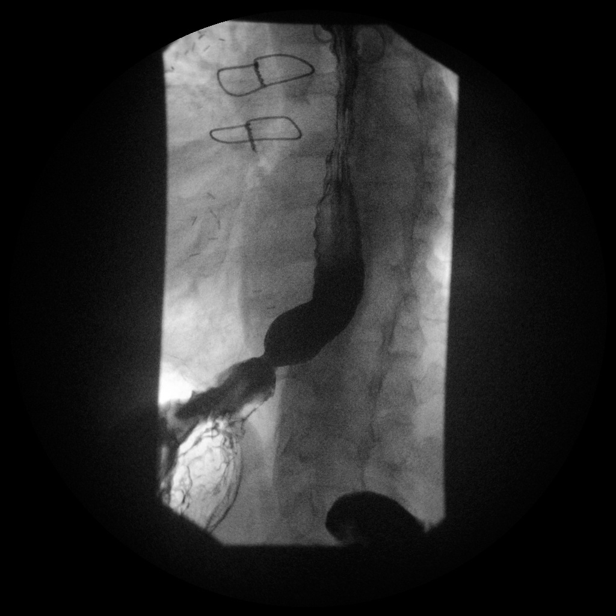

[Series 17: run · 1 of 1 slices shown (10 of 14)]
[im 1/1]
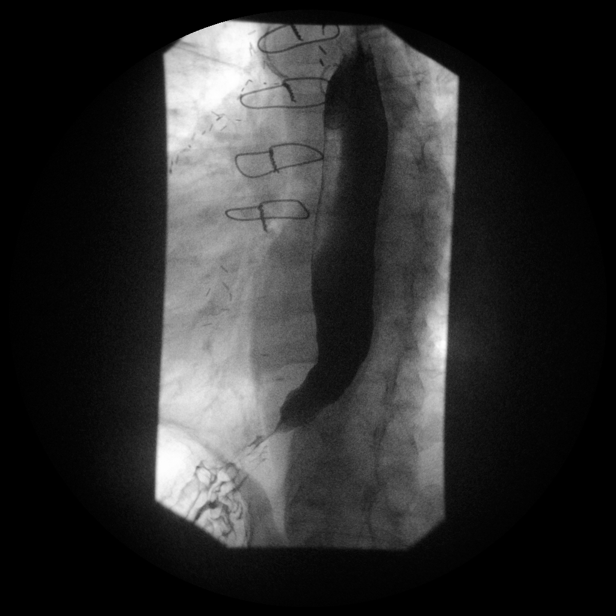

[Series 19: run · 1 of 1 slices shown (11 of 14)]
[im 1/1]
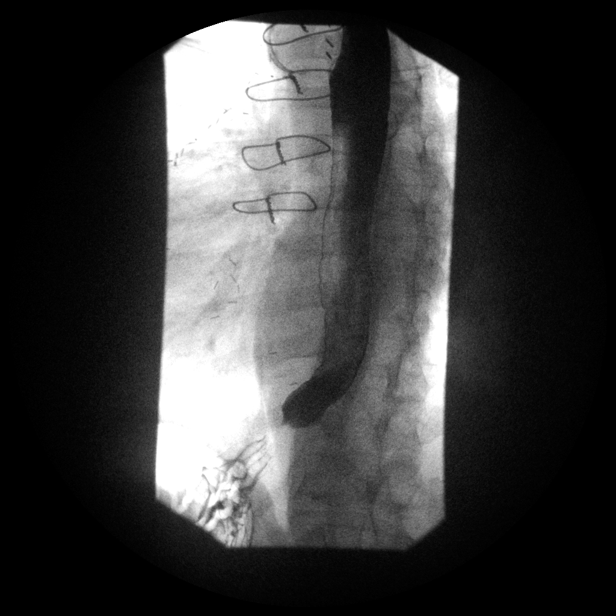

[Series 20: run · 1 of 1 slices shown (12 of 14)]
[im 1/1]
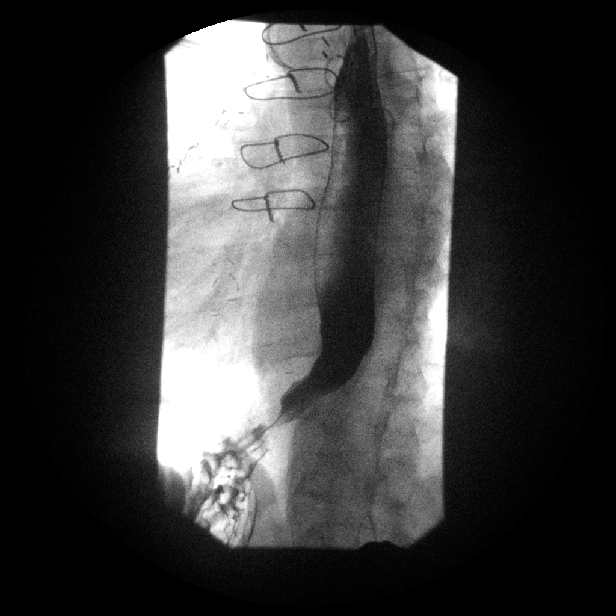

[Series 22: run · 1 of 1 slices shown (13 of 14)]
[im 1/1]
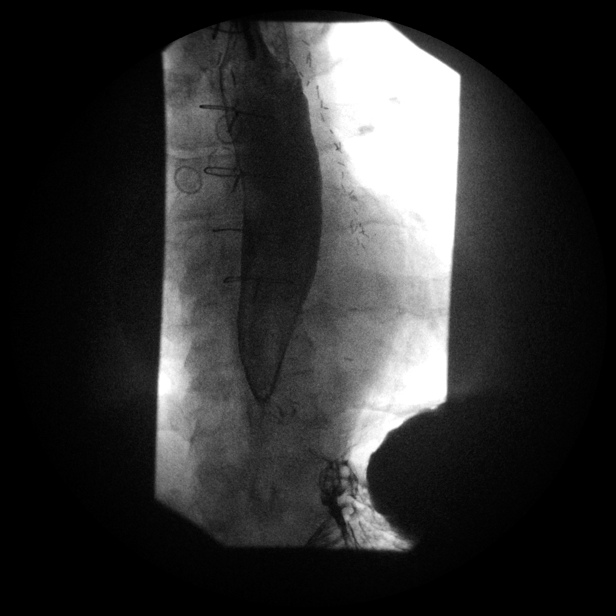

[Series 24: run · 1 of 1 slices shown (14 of 14)]
[im 1/1]
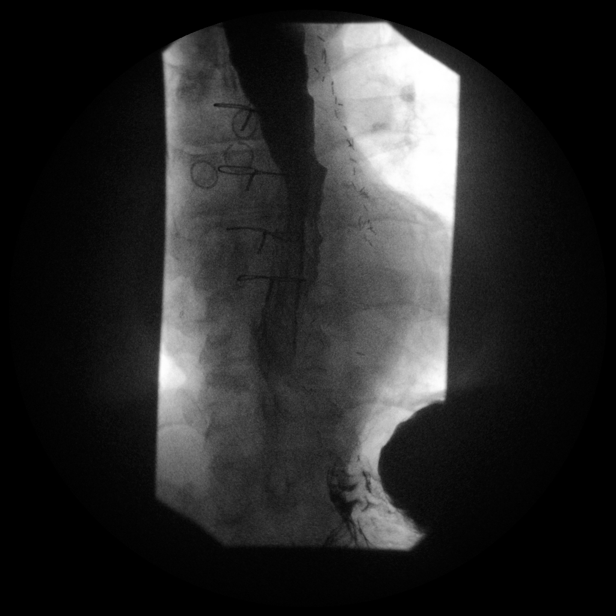

[14 of 24 positions shown; findings below may reference images not displayed]

FINDINGS: Swallowing function was normal without aspiration or penetration.
The esophagus is somewhat patulous. No mass or inflammatory change
is identified. There was delayed clearing of barium from the
esophagus with some tertiary contractions. Small hiatal hernia is
visualized. No gastroesophageal reflux was elicited. The barium
tablet lodged at the gastroesophageal junction and did not pass into
the stomach with repeated swallows of water and barium.
IMPRESSION: Barium tablet lodged the gastroesophageal junction.

Nonspecific esophageal dysmotility.

Small hiatal hernia.

## 2017-02-09 ENCOUNTER — Ambulatory Visit: Payer: Medicare Other | Admitting: Neurology

## 2017-02-09 ENCOUNTER — Ambulatory Visit: Payer: Commercial Managed Care - HMO | Admitting: Neurology

## 2017-02-12 ENCOUNTER — Encounter: Payer: Self-pay | Admitting: Neurology

## 2017-03-31 ENCOUNTER — Encounter (HOSPITAL_COMMUNITY): Payer: Self-pay

## 2017-03-31 ENCOUNTER — Encounter (HOSPITAL_COMMUNITY)
Admission: RE | Admit: 2017-03-31 | Discharge: 2017-03-31 | Disposition: A | Discharge status: Home / Self Care | Payer: Medicare (Managed Care) | Source: Ambulatory Visit | Attending: Surgery | Admitting: Surgery

## 2017-03-31 ENCOUNTER — Other Ambulatory Visit: Payer: Self-pay | Admitting: Surgery

## 2017-03-31 DIAGNOSIS — Z01818 Encounter for other preprocedural examination: Secondary | ICD-10-CM | POA: Diagnosis present

## 2017-03-31 DIAGNOSIS — I440 Atrioventricular block, first degree: Secondary | ICD-10-CM | POA: Diagnosis not present

## 2017-03-31 DIAGNOSIS — Z01812 Encounter for preprocedural laboratory examination: Secondary | ICD-10-CM | POA: Insufficient documentation

## 2017-03-31 DIAGNOSIS — I10 Essential (primary) hypertension: Secondary | ICD-10-CM | POA: Insufficient documentation

## 2017-03-31 DIAGNOSIS — K4090 Unilateral inguinal hernia, without obstruction or gangrene, not specified as recurrent: Secondary | ICD-10-CM | POA: Diagnosis not present

## 2017-03-31 LAB — CBC
HEMATOCRIT: 38.2 % — AB (ref 39.0–52.0)
Hemoglobin: 11.9 g/dL — ABNORMAL LOW (ref 13.0–17.0)
MCH: 22 pg — ABNORMAL LOW (ref 26.0–34.0)
MCHC: 31.2 g/dL (ref 30.0–36.0)
MCV: 70.7 fL — AB (ref 78.0–100.0)
Platelets: 254 10*3/uL (ref 150–400)
RBC: 5.4 MIL/uL (ref 4.22–5.81)
RDW: 17.9 % — AB (ref 11.5–15.5)
WBC: 7.5 10*3/uL (ref 4.0–10.5)

## 2017-03-31 LAB — BASIC METABOLIC PANEL
ANION GAP: 9 (ref 5–15)
BUN: 12 mg/dL (ref 6–20)
CHLORIDE: 103 mmol/L (ref 101–111)
CO2: 28 mmol/L (ref 22–32)
Calcium: 9.2 mg/dL (ref 8.9–10.3)
Creatinine, Ser: 1.03 mg/dL (ref 0.61–1.24)
Glucose, Bld: 109 mg/dL — ABNORMAL HIGH (ref 65–99)
POTASSIUM: 4.1 mmol/L (ref 3.5–5.1)
SODIUM: 140 mmol/L (ref 135–145)

## 2017-03-31 NOTE — Patient Instructions (Addendum)
Justin Griffin  03/31/2017   Your procedure is scheduled on: 04-05-17  Report to [place] to 3rd floor to  [place] at 8:30 AM.    Call this number if you have problems the morning of surgery [phone]    Remember: ONLY 1 PERSON MAY GO WITH YOU TO SHORT STAY TO GET  READY MORNING OF YOUR SURGERY.  Do not eat food or drink liquids :After Midnight.     Take these medicines the morning of surgery with A SIP OF WATER: Atorvastatin (Lipitor), and Pantoprazole (Protonix)                                You may not have any metal on your body including hair pins and              piercings  Do not wear jewelry, make-up, lotions, powders or perfumes, deodorant               Men may shave face and neck.   Do not bring valuables to the hospital. [place].  Contacts, dentures or bridgework may not be worn into surgery.      Patients discharged the day of surgery will not be allowed to drive home.  Name and phone number of your driver: Justin Griffin [phone]                Please read over the following fact sheets you were given: _____________________________________________________________________             [facility] - Preparing for Surgery Before surgery, you can play an important role.  Because skin is not sterile, your skin needs to be as free of germs as possible.  You can reduce the number of germs on your skin by washing with CHG (chlorahexidine gluconate) soap before surgery.  CHG is an antiseptic cleaner which kills germs and bonds with the skin to continue killing germs even after washing. Please DO NOT use if you have an allergy to CHG or antibacterial soaps.  If your skin becomes reddened/irritated stop using the CHG and inform your nurse when you arrive at Short Stay. Do not shave (including legs and underarms) for at least 48 hours prior to the first  CHG shower.  You may shave your face/neck. Please follow these instructions carefully:  1.  Shower with CHG Soap the night before surgery and the  morning of Surgery.  2.  If you choose to wash your hair, wash your hair first as usual with your  normal  shampoo.  3.  After you shampoo, rinse your hair and body thoroughly to remove the  shampoo.                           4.  Use CHG as you would any other liquid soap.  You can apply chg directly  to the skin and wash                       Gently with a scrungie or clean washcloth.  5.  Apply the CHG Soap to your body ONLY FROM THE NECK DOWN.  Do not use on face/ open                           Wound or open sores. Avoid contact with eyes, ears mouth and genitals (private parts).                       Wash face,  Genitals (private parts) with your normal soap.             6.  Wash thoroughly, paying special attention to the area where your surgery  will be performed.  7.  Thoroughly rinse your body with warm water from the neck down.  8.  DO NOT shower/wash with your normal soap after using and rinsing off  the CHG Soap.                9.  Pat yourself dry with a clean towel.            10.  Wear clean pajamas.            11.  Place clean sheets on your bed the night of your first shower and do not  sleep with pets. Day of Surgery : Do not apply any lotions/deodorants the morning of surgery.  Please wear clean clothes to the hospital/surgery center.  FAILURE TO FOLLOW THESE INSTRUCTIONS MAY RESULT IN THE CANCELLATION OF YOUR SURGERY PATIENT SIGNATURE_________________________________  NURSE SIGNATURE__________________________________  ________________________________________________________________________

## 2017-04-04 NOTE — H&P (Signed)
Justin Griffin  Location: [facility] Patient #: [id number] DOB: 15-Jan-1941 Unknown / Language: Cleophus Molt / Race: Black or African American Male  History of Present Illness   The patient is a 76 year old male who presents with a complaint of right inguinal hernia.  The PCP is Dr. Dorian Pod ([facility])  The patient was referred by Dr. Dorian Pod Epifania Gore, his daughter, is with him  The patient is a poor historian because of his Alzheimer's. Most of the history I got from his daughter. He has had the hernia for several months. He saw Dr. Gaetano Hawthorne on 24 December 2016 and the hernia was noted at that time. Because the patient's poor memory, is hard to tell how much the hernias bother him. He's had no prior hernia surgery. He's had no nausea or vomiting secondary the hernia. He has been seen by Dr. Scarlette Shorts for esophageal stricture. It sounds like he may have some reflux disease also. He has noticed stomach, liver, or colon disease. He's had no prior abdominal surgery.  I discussed the indications and complications of hernia surgery with the patient. I discussed both the laparoscopic and open approach to hernia repair.. The potential risks of hernia surgery include, but are not limited to, bleeding, infection, open surgery, nerve injury, and recurrence of the hernia.  I provided the patient and his daugher literature about hernia surgery.  Plan:1) The problem is how well the patient can track his symptoms. His hernia is actually fairly good size and I could be best served by repairing electively. 2) We will get a cardiology clearance. All consults have to go through [facility]. 3) there is a concern of anesthesia exacerbating his Alzheimer's. The hernia could be observed, but my concern is how well the patient would negotiate changing symptoms.  Past Medical History: 1. CAD CABG - 1996 Last cath - 01/27/2005 Perfusion scan -  07/08/2009 - no ischemia He has been followed by Dr. Percival Spanish - he last saw her 11/21/2015 2. HTN 3. Alzheimer's disease 4. RA 5. History of esohageal reflux with esophageal stricture - upper endo - 07/29/2015. Colonoscopy - 11/08/2013 Dr. Rikki Spearing  Social History: Widower Lives by self in an apartment. Robing Dobransky, his daughter, is with him (though I think that she lives in [place]).  He has another daughter and two sons.  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  Past Surgical History (April Staton, CMA; 03/04/2017 9:55 AM) Cataract Surgery  Bilateral. Coronary Artery Bypass Graft   Diagnostic Studies History (April Staton, CMA; 03/04/2017 9:55 AM) Colonoscopy  1-5 years ago  Allergies (April Staton, CMA; 03/04/2017 9:55 AM) Simvastatin *ANTIHYPERLIPIDEMICS*   Medication History (April Staton, CMA; 03/04/2017 9:58 AM) Aspirin (325MG  Tablet, Oral) Active. Atorvastatin Calcium (80MG  Tablet, Oral) Active. Benazepril-Hydrochlorothiazide (20-25MG  Tablet, Oral) Active. Namzaric (28-10MG  Capsule ER 24HR, Oral) Active. Pantoprazole Sodium (40MG  Tablet DR, Oral) Active. Potassium Chloride Crys ER (10MEQ Tablet ER, Oral) Active.  Social History (April Staton, CMA; 03/04/2017 9:55 AM) Caffeine use  Carbonated beverages. No alcohol use  No drug use  Tobacco use  Former smoker.  Family History (April Staton, [place]; 03/04/2017 9:55 AM) Family history unknown  First Degree Relatives   Other Problems (April Staton, CMA; 03/04/2017 9:55 AM) Gastroesophageal Reflux Disease  Hemorrhoids  High blood pressure     Review of Systems (April Staton CMA; 03/04/2017 9:55 AM) General Not Present- Appetite Loss, Chills, Fatigue, Fever, Night Sweats, Weight Gain and Weight Loss. Skin Not  Present- Change in Wart/Mole, Dryness, Hives, Jaundice, New Lesions, Non-Healing Wounds, Rash and Ulcer. HEENT Not Present- Earache, Hearing Loss, Hoarseness, Nose  Bleed, Oral Ulcers, Ringing in the Ears, Seasonal Allergies, Sinus Pain, Sore Throat, Visual Disturbances, Wears glasses/contact lenses and Yellow Eyes. Respiratory Not Present- Bloody sputum, Chronic Cough, Difficulty Breathing, Snoring and Wheezing. Cardiovascular Not Present- Chest Pain, Difficulty Breathing Lying Down, Leg Cramps, Palpitations, Rapid Heart Rate, Shortness of Breath and Swelling of Extremities. Gastrointestinal Not Present- Abdominal Pain, Bloating, Bloody Stool, Change in Bowel Habits, Chronic diarrhea, Constipation, Difficulty Swallowing, Excessive gas, Gets full quickly at meals, Hemorrhoids, Indigestion, Nausea, Rectal Pain and Vomiting. Male Genitourinary Not Present- Blood in Urine, Change in Urinary Stream, Frequency, Impotence, Nocturia, Painful Urination, Urgency and Urine Leakage.  Vitals (April Staton CMA; 03/04/2017 9:58 AM) 03/04/2017 9:58 AM Weight: 204 lb Height: 69in Body Surface Area: 2.08 m Body Mass Index: 30.13 kg/m  Temp.: 64F(Oral)  Pulse: 82 (Regular)  P.OX: 98% (Room air) BP: 152/82 (Sitting, Left Arm, Standard)    Physical Exam  General: WN older AA M alert. He physically looks good, but is not much in engaged in our conversation. Skin: Inspection and palpation of the skin unremarkable.  Eyes: Conjunctivae white, pupils equal. Face, ears, nose, mouth, and throat: Face - normal. Normal ears and nose. Lips and teeth normal.  Neck: Supple. No mass. Trachea midline. No thyroid mass. Lymph Nodes: No supraclavicular or cervical adenopathy.  No axillary adenopathy.  No inguinal adenopathy.  Lungs: Normal respiratory effort. Clear to auscultation and symmetric breath sounds. Cardiovascular: Regular rate and rythm. Normal auscultation of the heart. No murmur or rub. Has well healed median sternotomy incision.  Abdomen: Soft. No mass. Liver and spleen not palpable. No tenderness. Normal bowel sounds. No abdominal scars.  He has a  moderate sized right inguinal hernia that is reducible. I do not feel a left inguinal hernia. Rectal: Not done.  Musculoskeletal/extremities: Normal gait. Good strength and ROM in upper and lower extremities.  Neurologic: Grossly intact to motor and sensory function.  Psychiatric: Has normal mood and affect. He has a poor memory and his daughter did most of the talking.  Assessment & Plan  1.  INGUINAL HERNIA OF RIGHT SIDE WITHOUT OBSTRUCTION OR GANGRENE (K40.90)  Plan:  1) C Note from Dr. Lenise Herald - 03/17/2017  2) Open repair of right inguinal hernia as an outpatient   2.  CORONARY ARTERY DISEASE WITHOUT ANGINA PECTORIS, UNSPECIFIED VESSEL OR LESION TYPE, UNSPECIFIED WHETHER NATIVE OR TRANSPLANTED HEART (I25.10)  CABG - 1996  Last cath - 01/27/2005  Perfusion scan - 07/08/2009 - no ischemia  He has been followed by Dr. Percival Spanish - he last saw her 11/21/2015 3.  ALZHEIMER'S DEMENTIA WITHOUT BEHAVIORAL DISTURBANCE, UNSPECIFIED TIMING OF DEMENTIA ONSET (G30.9)  4. HTN 5. RA 6. History of esohageal reflux with esophageal stricture - upper endo - 07/29/2015. Colonoscopy - 11/08/2013 Dr. Rana Snare, MD, [facility] Pager: [phone] Office phone:  [phone]

## 2017-04-05 ENCOUNTER — Encounter (HOSPITAL_COMMUNITY)
Admission: RE | Disposition: A | Discharge status: Home / Self Care | Payer: Self-pay | Source: Ambulatory Visit | Attending: Surgery

## 2017-04-05 ENCOUNTER — Ambulatory Visit (HOSPITAL_COMMUNITY): Payer: Medicare (Managed Care) | Admitting: Anesthesiology

## 2017-04-05 ENCOUNTER — Ambulatory Visit (HOSPITAL_COMMUNITY)
Admission: RE | Admit: 2017-04-05 | Discharge: 2017-04-05 | Disposition: A | Discharge status: Home / Self Care | Payer: Medicare (Managed Care) | Source: Ambulatory Visit | Attending: Surgery | Admitting: Surgery

## 2017-04-05 ENCOUNTER — Encounter (HOSPITAL_COMMUNITY): Payer: Self-pay | Admitting: *Deleted

## 2017-04-05 DIAGNOSIS — I10 Essential (primary) hypertension: Secondary | ICD-10-CM | POA: Insufficient documentation

## 2017-04-05 DIAGNOSIS — Z7982 Long term (current) use of aspirin: Secondary | ICD-10-CM | POA: Insufficient documentation

## 2017-04-05 DIAGNOSIS — M069 Rheumatoid arthritis, unspecified: Secondary | ICD-10-CM | POA: Insufficient documentation

## 2017-04-05 DIAGNOSIS — Z951 Presence of aortocoronary bypass graft: Secondary | ICD-10-CM | POA: Insufficient documentation

## 2017-04-05 DIAGNOSIS — Z87891 Personal history of nicotine dependence: Secondary | ICD-10-CM | POA: Diagnosis not present

## 2017-04-05 DIAGNOSIS — K4090 Unilateral inguinal hernia, without obstruction or gangrene, not specified as recurrent: Secondary | ICD-10-CM | POA: Diagnosis present

## 2017-04-05 DIAGNOSIS — I2510 Atherosclerotic heart disease of native coronary artery without angina pectoris: Secondary | ICD-10-CM | POA: Insufficient documentation

## 2017-04-05 DIAGNOSIS — Z888 Allergy status to other drugs, medicaments and biological substances status: Secondary | ICD-10-CM | POA: Diagnosis not present

## 2017-04-05 DIAGNOSIS — K219 Gastro-esophageal reflux disease without esophagitis: Secondary | ICD-10-CM | POA: Insufficient documentation

## 2017-04-05 DIAGNOSIS — G309 Alzheimer's disease, unspecified: Secondary | ICD-10-CM | POA: Diagnosis not present

## 2017-04-05 DIAGNOSIS — F0280 Dementia in other diseases classified elsewhere without behavioral disturbance: Secondary | ICD-10-CM | POA: Insufficient documentation

## 2017-04-05 HISTORY — DX: Dementia in other diseases classified elsewhere, unspecified severity, without behavioral disturbance, psychotic disturbance, mood disturbance, and anxiety: F02.80

## 2017-04-05 HISTORY — PX: INSERTION OF MESH: SHX5868

## 2017-04-05 HISTORY — PX: INGUINAL HERNIA REPAIR: SHX194

## 2017-04-05 HISTORY — DX: Alzheimer's disease, unspecified: G30.9

## 2017-04-05 SURGERY — REPAIR, HERNIA, INGUINAL, ADULT
Anesthesia: General | Laterality: Right

## 2017-04-05 MED ORDER — DEXAMETHASONE SODIUM PHOSPHATE 10 MG/ML IJ SOLN
INTRAMUSCULAR | Status: AC
  Filled 2017-04-05: qty 1

## 2017-04-05 MED ORDER — CHLORHEXIDINE GLUCONATE CLOTH 2 % EX PADS: 6.0000 | MEDICATED_PAD | Freq: Once | CUTANEOUS | Status: DC

## 2017-04-05 MED ORDER — DEXAMETHASONE SODIUM PHOSPHATE 10 MG/ML IJ SOLN
INTRAMUSCULAR | Status: DC | PRN
  Administered 2017-04-05: 10 mg via INTRAVENOUS

## 2017-04-05 MED ORDER — CEFAZOLIN SODIUM-DEXTROSE 2-4 GM/100ML-% IV SOLN
2.0000 g | INTRAVENOUS | Status: AC
  Administered 2017-04-05: 2 g via INTRAVENOUS
  Filled 2017-04-05: qty 100

## 2017-04-05 MED ORDER — TRAMADOL HCL 50 MG PO TABS: 50.0000 mg | ORAL_TABLET | Freq: Four times a day (QID) | ORAL | 1 refills | Status: DC | PRN

## 2017-04-05 MED ORDER — LACTATED RINGERS IV SOLN
INTRAVENOUS | Status: DC
  Administered 2017-04-05: 10:00:00 via INTRAVENOUS

## 2017-04-05 MED ORDER — FENTANYL CITRATE (PF) 100 MCG/2ML IJ SOLN
INTRAMUSCULAR | Status: AC
  Filled 2017-04-05: qty 2

## 2017-04-05 MED ORDER — ONDANSETRON HCL 4 MG/2ML IJ SOLN
INTRAMUSCULAR | Status: DC | PRN
  Administered 2017-04-05: 4 mg via INTRAVENOUS

## 2017-04-05 MED ORDER — FENTANYL CITRATE (PF) 100 MCG/2ML IJ SOLN: 25.0000 ug | INTRAMUSCULAR | Status: DC | PRN

## 2017-04-05 MED ORDER — MIDAZOLAM HCL 5 MG/5ML IJ SOLN: INTRAMUSCULAR | Status: DC | PRN

## 2017-04-05 MED ORDER — BUPIVACAINE LIPOSOME 1.3 % IJ SUSP
20.0000 mL | Freq: Once | INTRAMUSCULAR | Status: AC
  Administered 2017-04-05: 20 mL
  Filled 2017-04-05: qty 20

## 2017-04-05 MED ORDER — ACETAMINOPHEN 500 MG PO TABS
1000.0000 mg | ORAL_TABLET | ORAL | Status: AC
  Administered 2017-04-05: 1000 mg via ORAL
  Filled 2017-04-05: qty 2

## 2017-04-05 MED ORDER — EPHEDRINE SULFATE-NACL 50-0.9 MG/10ML-% IV SOSY
PREFILLED_SYRINGE | INTRAVENOUS | Status: DC | PRN
  Administered 2017-04-05: 5 mg via INTRAVENOUS
  Administered 2017-04-05 (×2): 10 mg via INTRAVENOUS

## 2017-04-05 MED ORDER — 0.9 % SODIUM CHLORIDE (POUR BTL) OPTIME
TOPICAL | Status: DC | PRN
  Administered 2017-04-05: 1000 mL

## 2017-04-05 MED ORDER — GABAPENTIN 300 MG PO CAPS
300.0000 mg | ORAL_CAPSULE | ORAL | Status: AC
  Administered 2017-04-05: 300 mg via ORAL
  Filled 2017-04-05: qty 1

## 2017-04-05 MED ORDER — LIDOCAINE 2% (20 MG/ML) 5 ML SYRINGE
INTRAMUSCULAR | Status: DC | PRN
  Administered 2017-04-05: 100 mg via INTRAVENOUS

## 2017-04-05 MED ORDER — ONDANSETRON HCL 4 MG/2ML IJ SOLN
INTRAMUSCULAR | Status: AC
  Filled 2017-04-05: qty 2

## 2017-04-05 MED ORDER — LIDOCAINE 2% (20 MG/ML) 5 ML SYRINGE
INTRAMUSCULAR | Status: AC
  Filled 2017-04-05: qty 5

## 2017-04-05 MED ORDER — PROPOFOL 10 MG/ML IV BOLUS
INTRAVENOUS | Status: AC
  Filled 2017-04-05: qty 20

## 2017-04-05 MED ORDER — BUPIVACAINE-EPINEPHRINE (PF) 0.25% -1:200000 IJ SOLN
INTRAMUSCULAR | Status: DC | PRN
  Administered 2017-04-05: 20 mL

## 2017-04-05 MED ORDER — EPHEDRINE 5 MG/ML INJ
INTRAVENOUS | Status: AC
Start: 2017-04-05 — End: 2017-04-05
  Filled 2017-04-05: qty 10

## 2017-04-05 MED ORDER — BUPIVACAINE HCL (PF) 0.25 % IJ SOLN
INTRAMUSCULAR | Status: AC
  Filled 2017-04-05: qty 30

## 2017-04-05 MED ORDER — BUPIVACAINE-EPINEPHRINE (PF) 0.25% -1:200000 IJ SOLN
INTRAMUSCULAR | Status: AC
  Filled 2017-04-05: qty 30

## 2017-04-05 MED ORDER — FENTANYL CITRATE (PF) 100 MCG/2ML IJ SOLN
INTRAMUSCULAR | Status: DC | PRN
Start: 2017-04-05 — End: 2017-04-05
  Administered 2017-04-05: 100 ug via INTRAVENOUS
  Administered 2017-04-05: 50 ug via INTRAVENOUS

## 2017-04-05 MED ORDER — PROPOFOL 10 MG/ML IV BOLUS
INTRAVENOUS | Status: DC | PRN
  Administered 2017-04-05: 200 mg via INTRAVENOUS

## 2017-04-05 SURGICAL SUPPLY — 41 items
ADH SKN CLS APL DERMABOND .7 (GAUZE/BANDAGES/DRESSINGS) ×1
APL SKNCLS STERI-STRIP NONHPOA (GAUZE/BANDAGES/DRESSINGS)
BENZOIN TINCTURE PRP APPL 2/3 (GAUZE/BANDAGES/DRESSINGS) ×1 IMPLANT
BLADE SURG SZ10 CARB STEEL (BLADE) ×3 IMPLANT
CLOSURE WOUND 1/2 X4 (GAUZE/BANDAGES/DRESSINGS)
COVER SURGICAL LIGHT HANDLE (MISCELLANEOUS) ×3 IMPLANT
DECANTER SPIKE VIAL GLASS SM (MISCELLANEOUS) IMPLANT
DERMABOND ADVANCED (GAUZE/BANDAGES/DRESSINGS) ×2
DERMABOND ADVANCED .7 DNX12 (GAUZE/BANDAGES/DRESSINGS) ×1 IMPLANT
DISSECTOR ROUND CHERRY 3/8 STR (MISCELLANEOUS) ×3 IMPLANT
DRAIN PENROSE 18X1/2 LTX STRL (DRAIN) ×3 IMPLANT
DRAPE LAPAROTOMY TRNSV 102X78 (DRAPE) ×3 IMPLANT
ELECT PENCIL ROCKER SW 15FT (MISCELLANEOUS) ×3 IMPLANT
ELECT REM PT RETURN 15FT ADLT (MISCELLANEOUS) ×3 IMPLANT
GAUZE SPONGE 4X4 12PLY STRL (GAUZE/BANDAGES/DRESSINGS) IMPLANT
GLOVE BIOGEL PI IND STRL 7.0 (GLOVE) ×1 IMPLANT
GLOVE BIOGEL PI INDICATOR 7.0 (GLOVE) ×2
GLOVE SURG SIGNA 7.5 PF LTX (GLOVE) ×3 IMPLANT
GOWN SPEC L4 XLG W/TWL (GOWN DISPOSABLE) ×3 IMPLANT
GOWN STRL REUS W/TWL LRG LVL3 (GOWN DISPOSABLE) ×3 IMPLANT
KIT BASIN OR (CUSTOM PROCEDURE TRAY) ×3 IMPLANT
MESH ULTRAPRO 3X6 7.6X15CM (Mesh General) ×3 IMPLANT
NDL HYPO 25X1 1.5 SAFETY (NEEDLE) ×1 IMPLANT
NEEDLE HYPO 25X1 1.5 SAFETY (NEEDLE) ×3 IMPLANT
NS IRRIG 1000ML POUR BTL (IV SOLUTION) ×1 IMPLANT
PACK BASIC VI WITH GOWN DISP (CUSTOM PROCEDURE TRAY) ×3 IMPLANT
SPONGE LAP 18X18 X RAY DECT (DISPOSABLE) ×3 IMPLANT
SPONGE LAP 4X18 X RAY DECT (DISPOSABLE) ×2 IMPLANT
STRIP CLOSURE SKIN 1/2X4 (GAUZE/BANDAGES/DRESSINGS) IMPLANT
SUT CHROMIC 0 SH (SUTURE) IMPLANT
SUT MNCRL AB 4-0 PS2 18 (SUTURE) ×2 IMPLANT
SUT NOVA 0 T19/GS 22DT (SUTURE) IMPLANT
SUT PROLENE 0 CT 2 (SUTURE) ×14 IMPLANT
SUT VIC AB 2-0 SH 27 (SUTURE) ×3
SUT VIC AB 2-0 SH 27X BRD (SUTURE) ×1 IMPLANT
SUT VIC AB 3-0 SH 18 (SUTURE) ×3 IMPLANT
SYR BULB IRRIGATION 50ML (SYRINGE) ×3 IMPLANT
SYR CONTROL 10ML LL (SYRINGE) ×3 IMPLANT
TAPE CLOTH SURG 6X10 WHT LF (GAUZE/BANDAGES/DRESSINGS) ×2 IMPLANT
TOWEL OR 17X26 10 PK STRL BLUE (TOWEL DISPOSABLE) ×3 IMPLANT
YANKAUER SUCT BULB TIP 10FT TU (MISCELLANEOUS) ×3 IMPLANT

## 2017-04-05 NOTE — Anesthesia Preprocedure Evaluation (Addendum)
Anesthesia Evaluation  Patient identified by MRN, date of birth, ID band Patient awake    Reviewed: Allergy & Precautions, NPO status , Patient's Chart, lab work & pertinent test results  Airway Mallampati: II  TM Distance: >3 FB     Dental   Pulmonary former smoker,    breath sounds clear to auscultation       Cardiovascular hypertension, + CAD   Rhythm:Regular Rate:Normal     Neuro/Psych    GI/Hepatic Neg liver ROS, GERD  ,  Endo/Other  negative endocrine ROS  Renal/GU negative Renal ROS     Musculoskeletal   Abdominal   Peds  Hematology   Anesthesia Other Findings   Reproductive/Obstetrics                            Anesthesia Physical Anesthesia Plan  ASA: III  Anesthesia Plan: General   Post-op Pain Management:    Induction: Intravenous  PONV Risk Score and Plan: 2 and Ondansetron, Propofol and Dexamethasone  Airway Management Planned: Oral ETT and LMA  Additional Equipment:   Intra-op Plan:   Post-operative Plan: Extubation in OR  Informed Consent: I have reviewed the patients History and Physical, chart, labs and discussed the procedure including the risks, benefits and alternatives for the proposed anesthesia with the patient or authorized representative who has indicated his/her understanding and acceptance.   Dental advisory given  Plan Discussed with: CRNA, Anesthesiologist and Surgeon  Anesthesia Plan Comments:        Anesthesia Quick Evaluation

## 2017-04-05 NOTE — Anesthesia Procedure Notes (Signed)
Procedure Name: LMA Insertion Date/Time: 04/05/2017 9:44 AM Performed by: Lind Covert Pre-anesthesia Checklist: Patient identified, Emergency Drugs available, Suction available, Patient being monitored and Timeout performed Patient Re-evaluated:Patient Re-evaluated prior to inductionOxygen Delivery Method: Circle system utilized Preoxygenation: Pre-oxygenation with 100% oxygen Intubation Type: IV induction LMA: LMA with gastric port inserted LMA Size: 5.0 Number of attempts: 2 (attempted 5 LMA would not seat, used) Placement Confirmation: positive ETCO2 and breath sounds checked- equal and bilateral Tube secured with: Tape Dental Injury: Teeth and Oropharynx as per pre-operative assessment

## 2017-04-05 NOTE — Transfer of Care (Signed)
Immediate Anesthesia Transfer of Care Note  Patient: Justin Griffin  Procedure(s) Performed: Procedure(s): OPEN RIGHT INGUINAL HERNIA REPAIR (Right)  Patient Location: PACU  Anesthesia Type:General  Level of Consciousness: sedated  Airway & Oxygen Therapy: Patient Spontanous Breathing and Patient connected to face mask oxygen  Post-op Assessment: Report given to RN and Post -op Vital signs reviewed and stable  Post vital signs: Reviewed and stable  Last Vitals:  Vitals:   04/05/17 0824  BP: (!) 152/77  Pulse: 79  Resp: 18  Temp: 36.9 C    Last Pain:  Vitals:   04/05/17 0845  TempSrc:   PainSc: 0-No pain      Patients Stated Pain Goal: 3 (92/[id number]/44 [id number])  Complications: No apparent anesthesia complications

## 2017-04-05 NOTE — Progress Notes (Signed)
Report to [name]

## 2017-04-05 NOTE — Interval H&P Note (Signed)
History and Physical Interval Note:  04/05/2017 9:36 AM  Justin Griffin  has presented today for surgery, with the diagnosis of right inguinal hernia  The various methods of treatment have been discussed with the patient and family.  Her daughter is at the bedside.  After consideration of risks, benefits and other options for treatment, the patient has consented to  Procedure(s): OPEN RIGHT INGUINAL HERNIA REPAIR (Right) as a surgical intervention .  The patient's history has been reviewed, patient examined, no change in status, stable for surgery.  I have reviewed the patient's chart and labs.  Questions were answered to the patient's satisfaction.     Benyamin Jeff H

## 2017-04-05 NOTE — Discharge Instructions (Signed)
General Anesthesia, Adult, Care After These instructions provide you with information about caring for yourself after your procedure. Your health care provider may also give you more specific instructions. Your treatment has been planned according to current medical practices, but problems sometimes occur. Call your health care provider if you have any problems or questions after your procedure. What can I expect after the procedure? After the procedure, it is common to have:  Vomiting.  A sore throat.  Mental slowness.  It is common to feel:  Nauseous.  Cold or shivery.  Sleepy.  Tired.  Sore or achy, even in parts of your body where you did not have surgery.  Follow these instructions at home: For at least 24 hours after the procedure:  Do not: ? Participate in activities where you could fall or become injured. ? Drive. ? Use heavy machinery. ? Drink alcohol. ? Take sleeping pills or medicines that cause drowsiness. ? Make important decisions or sign legal documents. ? Take care of children on your own.  Rest. Eating and drinking  If you vomit, drink water, juice, or soup when you can drink without vomiting.  Drink enough fluid to keep your urine clear or pale yellow.  Make sure you have little or no nausea before eating solid foods.  Follow the diet recommended by your health care provider. General instructions  Have a responsible adult stay with you until you are awake and alert.  Return to your normal activities as told by your health care provider. Ask your health care provider what activities are safe for you.  Take over-the-counter and prescription medicines only as told by your health care provider.  If you smoke, do not smoke without supervision.  Keep all follow-up visits as told by your health care provider. This is important. Contact a health care provider if:  You continue to have nausea or vomiting at home, and medicines are not helpful.  You  cannot drink fluids or start eating again.  You cannot urinate after 8-12 hours.  You develop a skin rash.  You have fever.  You have increasing redness at the site of your procedure. Get help right away if:  You have difficulty breathing.  You have chest pain.  You have unexpected bleeding.  You feel that you are having a life-threatening or urgent problem. This information is not intended to replace advice given to you by your health care provider. Make sure you discuss any questions you have with your health care provider. Document Released: 12/28/2000 Document Revised: 02/24/2016 Document Reviewed: 09/05/2015 Elsevier Interactive Patient Education  2018 [place] TO PATIENT  Activity:  Lifting - No lifting more than 15 pounds for 2 weeks.  Wound Care:   Leave incision dry for 2 days, then may shower.  Diet:  As tolerated.  Follow up appointment:  Call Dr. Pollie Friar office [facility]) at [phone] for an appointment in 2 to 3 weeks.  Medications and dosages:  Resume your home medications.  You have a prescription for:  Tramadol  Call Dr. Lucia Gaskins or his office  [phone]) if you have:  Temperature greater than 100.4,  Persistent nausea and vomiting,  Severe uncontrolled pain,  Redness, tenderness, or signs of infection (pain, swelling, redness, odor or green/yellow discharge around the site),  Difficulty breathing, headache or visual disturbances,  Any other questions or concerns you may have after discharge.  In an emergency, call 911 or go to an Emergency Department at  a nearby hospital.

## 2017-04-05 NOTE — Anesthesia Postprocedure Evaluation (Signed)
Anesthesia Post Note  Patient: Justin Griffin  Procedure(s) Performed: Procedure(s) (LRB): OPEN RIGHT INGUINAL HERNIA REPAIR (Right) INSERTION OF MESH (Right)     Patient location during evaluation: PACU Anesthesia Type: General Pain management: pain level controlled Vital Signs Assessment: post-procedure vital signs reviewed and stable Cardiovascular status: stable Anesthetic complications: no    Last Vitals:  Vitals:   04/05/17 1223 04/05/17 1235  BP: 139/86 (!) 146/78  Pulse: 82 87  Resp: 11 14  Temp: 36.7 C 36.7 C    Last Pain:  Vitals:   04/05/17 1200  TempSrc:   PainSc: Asleep                 Sophina Mitten

## 2017-04-05 NOTE — Op Note (Signed)
04/05/2017  11:29 AM  PATIENT:  Justin Griffin, 76 y.o., male, MRN: [id number]  PREOP DIAGNOSIS:  right inguinal hernia  POSTOP DIAGNOSIS:   right inguinal hernia, indirect, medium to large  PROCEDURE:   Procedure(s):  OPEN RIGHT INGUINAL HERNIA REPAIR  SURGEON:   Alphonsa Overall, M.D.  ANESTHESIA:   general  Anesthesiologist: Belinda Block, MD CRNA: Lind Covert, CRNA; Armistead, Courtney Heys, CRNA  General  EBL:  50  ml  LOCAL MEDICATIONS USED:   20 cc of Exparel + 20 cc 1/4% marcaine  SPECIMEN:   None  COUNTS CORRECT:  YES  INDICATIONS FOR PROCEDURE:  Justin Griffin is a 76 y.o. (DOB: 06/04/1940) AA male whose primary care physician is Golden Circle, FNP and comes for repair of right inguinal hernia   The indications and risks of the hernia surgery were explained to the patient.  The risks include, but are not limited to, infection, bleeding, recurrence of the hernia, and nerve injury.  Operative Note: The patient was taken to room number 5 at [facility].  He underwent a general anesthesia.    A time out was held and the surgical checklist run.  His lower abdomen was shaved and then prepped with betadine.  A right inguinal incision was made through the subcutaneous fat to the external oblique fascia.  The external ring was opened and the cord structures encircled with a penrose drain.  The patient had an medium to large indirect right inguinal hernia.  I identified the ileo inguinal nerve and preserved this during the dissection.  The patient had a hernia sac the came along the anterior medial portion of the cord structures. The hernia sac was about 8 cm and had  3 cm neck. The sac was dissected free of the cord structures.  The sac was opened and went to the peritoneal cavity.  There was no palpable mass within the peritoneal cavity.  The sac was ligated with a 2-0 Vicryl suture.  The inguinal floor was repaired with a 3 x 6 inch piece of Ultrapro mesh.  The mesh was cut  to fit the inguinal floor.  The mesh was sewn in place with interrupted 0 Prolenen suture.  A key hole was made for the internal ring.  The mesh lay flat.  The inguinal floor was covered and the internal ring recreated.  The cord structures were returned to a normal location.  The external oblique was closed with a 3-0 vicryl.  The fascia and subcutaneous tissues were infiltrated with the local mixture of Exaprel and Marcaine.  The skin was closed with 4-0 monocryl and painted with LiquidBand. The sponge and needle count were correct at the end of the case.  The patient was transported to the recovery room in good condition.  The patient will go home today.  Discharge instructions reviewed.  Alphonsa Overall, MD, [facility] Surgery Pager: [phone] Office phone:  [phone]

## 2017-05-11 ENCOUNTER — Ambulatory Visit
Admission: RE | Admit: 2017-05-11 | Discharge: 2017-05-11 | Disposition: A | Discharge status: Home / Self Care | Payer: Medicare (Managed Care) | Source: Ambulatory Visit | Attending: Nurse Practitioner | Admitting: Nurse Practitioner

## 2017-05-11 ENCOUNTER — Other Ambulatory Visit: Payer: Self-pay | Admitting: Nurse Practitioner

## 2017-05-11 DIAGNOSIS — M25431 Effusion, right wrist: Secondary | ICD-10-CM

## 2017-05-11 DIAGNOSIS — M25531 Pain in right wrist: Principal | ICD-10-CM

## 2017-06-10 ENCOUNTER — Inpatient Hospital Stay (HOSPITAL_COMMUNITY)
Admission: EM | Admit: 2017-06-10 | Discharge: 2017-06-12 | DRG: 057 | Disposition: A | Discharge status: Home / Self Care | Payer: Medicare (Managed Care) | Attending: Internal Medicine | Admitting: Internal Medicine

## 2017-06-10 ENCOUNTER — Encounter (HOSPITAL_COMMUNITY): Payer: Self-pay | Admitting: *Deleted

## 2017-06-10 ENCOUNTER — Emergency Department (HOSPITAL_COMMUNITY): Payer: Medicare (Managed Care)

## 2017-06-10 ENCOUNTER — Ambulatory Visit (HOSPITAL_COMMUNITY)
Admission: EM | Admit: 2017-06-10 | Discharge: 2017-06-10 | Disposition: A | Discharge status: Home / Self Care | Payer: Medicare (Managed Care) | Source: Home / Self Care

## 2017-06-10 DIAGNOSIS — I2510 Atherosclerotic heart disease of native coronary artery without angina pectoris: Secondary | ICD-10-CM | POA: Diagnosis present

## 2017-06-10 DIAGNOSIS — Z66 Do not resuscitate: Secondary | ICD-10-CM | POA: Diagnosis present

## 2017-06-10 DIAGNOSIS — M069 Rheumatoid arthritis, unspecified: Secondary | ICD-10-CM | POA: Diagnosis present

## 2017-06-10 DIAGNOSIS — R410 Disorientation, unspecified: Secondary | ICD-10-CM | POA: Diagnosis not present

## 2017-06-10 DIAGNOSIS — Z7982 Long term (current) use of aspirin: Secondary | ICD-10-CM

## 2017-06-10 DIAGNOSIS — Z8249 Family history of ischemic heart disease and other diseases of the circulatory system: Secondary | ICD-10-CM

## 2017-06-10 DIAGNOSIS — R1319 Other dysphagia: Secondary | ICD-10-CM | POA: Diagnosis present

## 2017-06-10 DIAGNOSIS — E785 Hyperlipidemia, unspecified: Secondary | ICD-10-CM | POA: Diagnosis present

## 2017-06-10 DIAGNOSIS — G301 Alzheimer's disease with late onset: Secondary | ICD-10-CM | POA: Diagnosis present

## 2017-06-10 DIAGNOSIS — R52 Pain, unspecified: Secondary | ICD-10-CM

## 2017-06-10 DIAGNOSIS — M25461 Effusion, right knee: Secondary | ICD-10-CM | POA: Diagnosis present

## 2017-06-10 DIAGNOSIS — I10 Essential (primary) hypertension: Secondary | ICD-10-CM | POA: Diagnosis present

## 2017-06-10 DIAGNOSIS — Z87891 Personal history of nicotine dependence: Secondary | ICD-10-CM

## 2017-06-10 DIAGNOSIS — K219 Gastro-esophageal reflux disease without esophagitis: Secondary | ICD-10-CM | POA: Diagnosis present

## 2017-06-10 DIAGNOSIS — R509 Fever, unspecified: Secondary | ICD-10-CM | POA: Diagnosis present

## 2017-06-10 DIAGNOSIS — Z86010 Personal history of colonic polyps: Secondary | ICD-10-CM

## 2017-06-10 DIAGNOSIS — Z888 Allergy status to other drugs, medicaments and biological substances status: Secondary | ICD-10-CM

## 2017-06-10 DIAGNOSIS — Z951 Presence of aortocoronary bypass graft: Secondary | ICD-10-CM

## 2017-06-10 DIAGNOSIS — E860 Dehydration: Secondary | ICD-10-CM | POA: Diagnosis present

## 2017-06-10 DIAGNOSIS — F0280 Dementia in other diseases classified elsewhere without behavioral disturbance: Secondary | ICD-10-CM | POA: Diagnosis present

## 2017-06-10 DIAGNOSIS — N179 Acute kidney failure, unspecified: Secondary | ICD-10-CM | POA: Diagnosis present

## 2017-06-10 DIAGNOSIS — Z8042 Family history of malignant neoplasm of prostate: Secondary | ICD-10-CM

## 2017-06-10 LAB — BASIC METABOLIC PANEL
ANION GAP: 11 (ref 5–15)
BUN: 24 mg/dL — AB (ref 6–20)
CHLORIDE: 103 mmol/L (ref 101–111)
CO2: 24 mmol/L (ref 22–32)
Calcium: 9.1 mg/dL (ref 8.9–10.3)
Creatinine, Ser: 1.46 mg/dL — ABNORMAL HIGH (ref 0.61–1.24)
GFR calc non Af Amer: 45 mL/min — ABNORMAL LOW (ref >60)
GFR, EST AFRICAN AMERICAN: 52 mL/min — AB (ref >60)
Glucose, Bld: 165 mg/dL — ABNORMAL HIGH (ref 65–99)
POTASSIUM: 3.7 mmol/L (ref 3.5–5.1)
SODIUM: 138 mmol/L (ref 135–145)

## 2017-06-10 LAB — CBC
HEMATOCRIT: 38.9 % — AB (ref 39.0–52.0)
HEMOGLOBIN: 12 g/dL — AB (ref 13.0–17.0)
MCH: 22.1 pg — ABNORMAL LOW (ref 26.0–34.0)
MCHC: 30.8 g/dL (ref 30.0–36.0)
MCV: 71.6 fL — AB (ref 78.0–100.0)
Platelets: 198 10*3/uL (ref 150–400)
RBC: 5.43 MIL/uL (ref 4.22–5.81)
RDW: 18.1 % — ABNORMAL HIGH (ref 11.5–15.5)
WBC: 12.3 10*3/uL — AB (ref 4.0–10.5)

## 2017-06-10 MED ORDER — SODIUM CHLORIDE 0.9 % IV BOLUS (SEPSIS)
1000.0000 mL | Freq: Once | INTRAVENOUS | Status: AC
  Administered 2017-06-10: 1000 mL via INTRAVENOUS

## 2017-06-10 NOTE — ED Provider Notes (Signed)
[facility] DEPT Provider Note   CSN: [id number] Arrival date & time: 06/10/17  1837     History   Chief Complaint Chief Complaint  Patient presents with  . Weakness    HPI Justin Griffin is a 76 y.o. male.  Patient presents to the emergency department with chief complaint of altered mental status. Patient has history of Alzheimer's dementia.  He is accompanied by his daughter, who states the patient lives at home alone. Reportedly, the patient was found outside wandering the streets last night at 1 AM. He was given found wandering today. The daughter reports that he has never done this before. She states that she checks on the patient daily, and denies him having any recent illnesses, fever, cough, or any other complaints. She denies any new changes in his medications. He shouldn't states that he has pain in his right knee. He is otherwise unable to assist with his history secondary to dementia. Level V caveat applies secondary to dementia/AMS.   The history is provided by the patient. No language interpreter was used.    Past Medical History:  Diagnosis Date  . Alzheimer's disease   . CAD (coronary artery disease)    Catheterization 2006 LAD occluded, OM 95% stenosis followed by mid occlusion, first obtuse marginal occluded, right coronary artery diffuse moderate disease. LIMA to the LAD was patent. Saphenous vein to PDA and posterior lateral patent with diffuse luminal irregularities, saphenous vein graft to second obtuse marginal is patent, saphenous vein graft to the first obtuse marginal had 25% stenosis.  . Esophageal reflux   . Esophageal stricture   . Other and unspecified hyperlipidemia   . Other dysphagia   . Rheumatoid arthritis(714.0)   . Status post dilation of esophageal narrowing   . Unspecified essential hypertension     Patient Active Problem List   Diagnosis Date Noted  . Inguinal hernia 12/24/2016  . Testicular pain, left 08/07/2016  . Hemorrhoid  08/07/2016  . 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 Routine general medical examination at a health care facility 01/27/2016  . Medicare annual wellness visit, subsequent 01/27/2016  . ESSENTIAL HYPERTENSION, BENIGN 08/08/2009  . GERD 07/04/2008  . RHEUMATOID ARTHRITIS 07/04/2008  . WEIGHT LOSS-ABNORMAL 07/04/2008  . DYSPHAGIA 07/04/2008  . TOBACCO USER 07/03/2008  . POLYP, COLON 02/04/2008  . DYSLIPIDEMIA 02/04/2008  . Essential hypertension 02/04/2008  . CORONARY ARTERY DISEASE 02/04/2008  . POLYPECTOMY, HX OF 02/04/2008  . CORONARY ARTERY BYPASS GRAFT, HX OF 02/04/2008    Past Surgical History:  Procedure Laterality Date  . CORONARY ARTERY BYPASS GRAFT  1996  . INGUINAL HERNIA REPAIR Right 04/05/2017   Procedure: OPEN RIGHT INGUINAL HERNIA REPAIR;  Surgeon: Alphonsa Overall, MD;  Location: [facility];  Service: General;  Laterality: Right;  . INSERTION OF MESH Right 04/05/2017   Procedure: INSERTION OF MESH;  Surgeon: Alphonsa Overall, MD;  Location: [facility];  Service: General;  Laterality: Right;  . left digit amputation         Home Medications    Prior to Admission medications   Medication Sig Start Date End Date Taking? Authorizing Provider  aspirin EC 325 MG tablet Take 325 mg by mouth daily.    [provider]  atorvastatin (LIPITOR) 80 MG tablet Take 1 tablet (80 mg total) by mouth daily. 02/10/16   Minus Breeding, MD  benazepril-hydrochlorthiazide (LOTENSIN HCT) 20-25 MG tablet Take 1 tablet by mouth daily. 03/05/16   Minus Breeding, MD  Memantine HCl-Donepezil  HCl (NAMZARIC) 28-10 MG CP24 Take 1 capsule by mouth daily. 12/07/16   Tomi Likens, Adam R, DO  pantoprazole (PROTONIX) 40 MG tablet Take 1 tablet (40 mg total) by mouth daily. 08/03/16   Irene Shipper, MD  potassium chloride (K-DUR,KLOR-CON) 10 MEQ tablet take 1 tablet by mouth once daily 12/07/16   Minus Breeding, MD  traMADol (ULTRAM) 50 MG tablet Take 1 tablet (50 mg total) by mouth  every 6 (six) hours as needed. 04/05/17   Alphonsa Overall, MD    Family History Family History  Problem Relation Age of Onset  . Heart disease Maternal Grandfather   . Prostate cancer Paternal Grandfather   . Heart disease Mother     Social History Social History  Substance Use Topics  . Smoking status: Former Smoker    Years: 0.00    Types: Cigarettes    Quit date: 10/05/1994  . Smokeless tobacco: Never Used  . Alcohol use No     Allergies   Zocor [simvastatin]   Review of Systems Review of Systems  All other systems reviewed and are negative.    Physical Exam Updated Vital Signs BP 132/76 (BP Location: Left Arm)   Pulse 91   Temp 99.6 F (37.6 C) (Oral)   Resp 14   SpO2 97%   Physical Exam  Constitutional: He is oriented to person, place, and time. He appears well-developed and well-nourished.  HENT:  Head: Normocephalic and atraumatic.  Eyes: Pupils are equal, round, and reactive to light. Conjunctivae and EOM are normal. Right eye exhibits no discharge. Left eye exhibits no discharge. No scleral icterus.  Neck: Normal range of motion. Neck supple. No JVD present.  Cardiovascular: Normal rate, regular rhythm and normal heart sounds.  Exam reveals no gallop and no friction rub.   No murmur heard. Pulmonary/Chest: Effort normal and breath sounds normal. No respiratory distress. He has no wheezes. He has no rales. He exhibits no tenderness.  Abdominal: Soft. He exhibits no distension and no mass. There is no tenderness. There is no rebound and no guarding.  Musculoskeletal: Normal range of motion. He exhibits no edema or tenderness.  Right knee TTP, ROM and strength reduced 2/2 pain, no acute swelling or redness, no sign of infection  Neurological: He is alert and oriented to person, place, and time.  Skin: Skin is warm and dry.  Psychiatric: He has a normal mood and affect. His behavior is normal. Judgment and thought content normal.  Nursing note and vitals  reviewed.    ED Treatments / Results  Labs (all labs ordered are listed, but only abnormal results are displayed) Labs Reviewed  BASIC METABOLIC PANEL - Abnormal; Notable for the following:       Result Value   Glucose, Bld 165 (*)    BUN 24 (*)    Creatinine, Ser 1.46 (*)    GFR calc non Af Amer 45 (*)    GFR calc Af Amer 52 (*)    All other components within normal limits  CBC - Abnormal; Notable for the following:    WBC 12.3 (*)    Hemoglobin 12.0 (*)    HCT 38.9 (*)    MCV 71.6 (*)    MCH 22.1 (*)    RDW 18.1 (*)    All other components within normal limits  URINALYSIS, ROUTINE W REFLEX MICROSCOPIC    EKG  EKG Interpretation None       Radiology No results found.  Procedures Procedures (including critical care  time)  Medications Ordered in ED Medications - No data to display   Initial Impression / Assessment and Plan / ED Course  I have reviewed the triage vital signs and the nursing notes.  Pertinent labs & imaging results that were available during my care of the patient were reviewed by me and considered in my medical decision making (see chart for details).     Patient with dementia.  Reportedly acting unusual for self.  Wandering the streets at night.  Noted to have normal VS, but elevated Cr.  Will check urine.  Could be dry, will give some fluid.  Will reassess.  UA is inconsistent with UTI.  CTs and plain imaging are unrevealing for acute process.  Patient seen by and discussed with Dr. Christy Gentles, who recommends admission for acute delirium.  Patient will also likely need to be seen by social work/case management in the morning given that he lives at home alone and has been out wandering the streets.  Unclear etiology of the patient's increased confusion.    Appreciated internal medicine team for admitting this [facility] patient.  Final Clinical Impressions(s) / ED Diagnoses   Final diagnoses:  Delirium    New Prescriptions New Prescriptions     No medications on file     Montine Circle, Hershal Coria 06/11/17 0430    Ripley Fraise, MD 06/11/17 [phone]

## 2017-06-10 NOTE — ED Notes (Signed)
Patient is with daughter.  Patient has dementia.  Patient has been missing today.  gpd brought patient home.  Patient is having difficulty walking.  Daughter reports this is unusual.  Dr Joseph Art spoke to patient and daughter and daughter took patient to emergency department

## 2017-06-10 NOTE — ED Triage Notes (Signed)
Pt in with family, they report the patient went missing around 3a today, pt has dementia and he wondered off, pt has been out walking around all day, he was arrested and police brought him home. Family noticed that he was very tired and is complaining of pain to his right knee, pt sleeping during triage- other than being tired family denies changes from his baseline mental status.

## 2017-06-10 NOTE — ED Notes (Signed)
Patient transported to X-ray 

## 2017-06-11 ENCOUNTER — Emergency Department (HOSPITAL_COMMUNITY): Payer: Medicare (Managed Care)

## 2017-06-11 DIAGNOSIS — F05 Delirium due to known physiological condition: Secondary | ICD-10-CM | POA: Diagnosis not present

## 2017-06-11 DIAGNOSIS — M069 Rheumatoid arthritis, unspecified: Secondary | ICD-10-CM

## 2017-06-11 DIAGNOSIS — Z8042 Family history of malignant neoplasm of prostate: Secondary | ICD-10-CM | POA: Diagnosis not present

## 2017-06-11 DIAGNOSIS — F0280 Dementia in other diseases classified elsewhere without behavioral disturbance: Secondary | ICD-10-CM | POA: Diagnosis present

## 2017-06-11 DIAGNOSIS — Z9889 Other specified postprocedural states: Secondary | ICD-10-CM | POA: Diagnosis not present

## 2017-06-11 DIAGNOSIS — R410 Disorientation, unspecified: Secondary | ICD-10-CM | POA: Diagnosis present

## 2017-06-11 DIAGNOSIS — M25461 Effusion, right knee: Secondary | ICD-10-CM | POA: Diagnosis present

## 2017-06-11 DIAGNOSIS — Z87891 Personal history of nicotine dependence: Secondary | ICD-10-CM | POA: Diagnosis not present

## 2017-06-11 DIAGNOSIS — N400 Enlarged prostate without lower urinary tract symptoms: Secondary | ICD-10-CM | POA: Diagnosis not present

## 2017-06-11 DIAGNOSIS — Z66 Do not resuscitate: Secondary | ICD-10-CM

## 2017-06-11 DIAGNOSIS — R1319 Other dysphagia: Secondary | ICD-10-CM | POA: Diagnosis present

## 2017-06-11 DIAGNOSIS — I2510 Atherosclerotic heart disease of native coronary artery without angina pectoris: Secondary | ICD-10-CM | POA: Diagnosis present

## 2017-06-11 DIAGNOSIS — N179 Acute kidney failure, unspecified: Secondary | ICD-10-CM | POA: Diagnosis present

## 2017-06-11 DIAGNOSIS — R509 Fever, unspecified: Secondary | ICD-10-CM | POA: Diagnosis present

## 2017-06-11 DIAGNOSIS — Z951 Presence of aortocoronary bypass graft: Secondary | ICD-10-CM

## 2017-06-11 DIAGNOSIS — Z86010 Personal history of colonic polyps: Secondary | ICD-10-CM | POA: Diagnosis not present

## 2017-06-11 DIAGNOSIS — Z79899 Other long term (current) drug therapy: Secondary | ICD-10-CM | POA: Diagnosis not present

## 2017-06-11 DIAGNOSIS — F0391 Unspecified dementia with behavioral disturbance: Secondary | ICD-10-CM

## 2017-06-11 DIAGNOSIS — Z7982 Long term (current) use of aspirin: Secondary | ICD-10-CM

## 2017-06-11 DIAGNOSIS — K219 Gastro-esophageal reflux disease without esophagitis: Secondary | ICD-10-CM | POA: Diagnosis present

## 2017-06-11 DIAGNOSIS — M1711 Unilateral primary osteoarthritis, right knee: Secondary | ICD-10-CM

## 2017-06-11 DIAGNOSIS — Z9183 Wandering in diseases classified elsewhere: Secondary | ICD-10-CM | POA: Diagnosis not present

## 2017-06-11 DIAGNOSIS — Z888 Allergy status to other drugs, medicaments and biological substances status: Secondary | ICD-10-CM | POA: Diagnosis not present

## 2017-06-11 DIAGNOSIS — Z8249 Family history of ischemic heart disease and other diseases of the circulatory system: Secondary | ICD-10-CM | POA: Diagnosis not present

## 2017-06-11 DIAGNOSIS — I10 Essential (primary) hypertension: Secondary | ICD-10-CM

## 2017-06-11 DIAGNOSIS — E785 Hyperlipidemia, unspecified: Secondary | ICD-10-CM

## 2017-06-11 DIAGNOSIS — E860 Dehydration: Secondary | ICD-10-CM | POA: Diagnosis present

## 2017-06-11 DIAGNOSIS — G301 Alzheimer's disease with late onset: Secondary | ICD-10-CM | POA: Diagnosis present

## 2017-06-11 DIAGNOSIS — F0281 Dementia in other diseases classified elsewhere with behavioral disturbance: Secondary | ICD-10-CM | POA: Diagnosis not present

## 2017-06-11 LAB — CBC
HCT: 35.3 % — ABNORMAL LOW (ref 39.0–52.0)
Hemoglobin: 11 g/dL — ABNORMAL LOW (ref 13.0–17.0)
MCH: 22.2 pg — ABNORMAL LOW (ref 26.0–34.0)
MCHC: 31.2 g/dL (ref 30.0–36.0)
MCV: 71.2 fL — AB (ref 78.0–100.0)
PLATELETS: 177 10*3/uL (ref 150–400)
RBC: 4.96 MIL/uL (ref 4.22–5.81)
RDW: 18.1 % — ABNORMAL HIGH (ref 11.5–15.5)
WBC: 11 10*3/uL — ABNORMAL HIGH (ref 4.0–10.5)

## 2017-06-11 LAB — URINALYSIS, ROUTINE W REFLEX MICROSCOPIC
BILIRUBIN URINE: NEGATIVE
GLUCOSE, UA: 50 mg/dL — AB
HGB URINE DIPSTICK: NEGATIVE
KETONES UR: NEGATIVE mg/dL
LEUKOCYTES UA: NEGATIVE
NITRITE: NEGATIVE
PROTEIN: 100 mg/dL — AB
Specific Gravity, Urine: 1.031 — ABNORMAL HIGH (ref 1.005–1.030)
pH: 5 (ref 5.0–8.0)

## 2017-06-11 LAB — COMPREHENSIVE METABOLIC PANEL
ALBUMIN: 3.3 g/dL — AB (ref 3.5–5.0)
ALT: 35 U/L (ref 17–63)
AST: 47 U/L — AB (ref 15–41)
Alkaline Phosphatase: 70 U/L (ref 38–126)
Anion gap: 8 (ref 5–15)
BUN: 21 mg/dL — ABNORMAL HIGH (ref 6–20)
CALCIUM: 8.9 mg/dL (ref 8.9–10.3)
CHLORIDE: 105 mmol/L (ref 101–111)
CO2: 26 mmol/L (ref 22–32)
Creatinine, Ser: 1.13 mg/dL (ref 0.61–1.24)
GFR calc Af Amer: >60 mL/min (ref >60)
GFR calc non Af Amer: >60 mL/min (ref >60)
GLUCOSE: 123 mg/dL — AB (ref 65–99)
Potassium: 4 mmol/L (ref 3.5–5.1)
SODIUM: 139 mmol/L (ref 135–145)
Total Bilirubin: 1 mg/dL (ref 0.3–1.2)
Total Protein: 6.6 g/dL (ref 6.5–8.1)

## 2017-06-11 LAB — GRAM STAIN

## 2017-06-11 LAB — SYNOVIAL CELL COUNT + DIFF, W/ CRYSTALS
Crystals, Fluid: NONE SEEN
Lymphocytes-Synovial Fld: 1 % (ref 0–20)
MONOCYTE-MACROPHAGE-SYNOVIAL FLUID: 12 % — AB (ref 50–90)
Neutrophil, Synovial: 87 % — ABNORMAL HIGH (ref 0–25)
WBC, Synovial: 6725 /mm3 — ABNORMAL HIGH (ref 0–200)

## 2017-06-11 MED ORDER — MELATONIN 3 MG PO TABS
3.0000 mg | ORAL_TABLET | Freq: Every day | ORAL | Status: DC
  Administered 2017-06-11: 3 mg via ORAL
  Filled 2017-06-11 (×2): qty 1

## 2017-06-11 MED ORDER — ATORVASTATIN CALCIUM 80 MG PO TABS
80.0000 mg | ORAL_TABLET | Freq: Every day | ORAL | Status: DC
  Administered 2017-06-11: 80 mg via ORAL
  Filled 2017-06-11 (×2): qty 1

## 2017-06-11 MED ORDER — LIDOCAINE HCL (PF) 1 % IJ SOLN
INTRAMUSCULAR | Status: AC
  Filled 2017-06-11: qty 20

## 2017-06-11 MED ORDER — PANTOPRAZOLE SODIUM 40 MG PO TBEC
40.0000 mg | DELAYED_RELEASE_TABLET | Freq: Every day | ORAL | Status: DC
  Administered 2017-06-11 – 2017-06-12 (×2): 40 mg via ORAL
  Filled 2017-06-11 (×2): qty 1

## 2017-06-11 MED ORDER — ENOXAPARIN SODIUM 40 MG/0.4ML ~~LOC~~ SOLN
40.0000 mg | SUBCUTANEOUS | Status: DC
Start: 2017-06-11 — End: 2017-06-12
  Administered 2017-06-11: 40 mg via SUBCUTANEOUS
  Filled 2017-06-11: qty 0.4

## 2017-06-11 MED ORDER — DONEPEZIL HCL 10 MG PO TABS
10.0000 mg | ORAL_TABLET | Freq: Every day | ORAL | Status: DC
  Administered 2017-06-11: 10 mg via ORAL
  Filled 2017-06-11: qty 1

## 2017-06-11 MED ORDER — IOPAMIDOL (ISOVUE-300) INJECTION 61%
INTRAVENOUS | Status: AC
  Administered 2017-06-11: 100 mL
  Filled 2017-06-11: qty 100

## 2017-06-11 MED ORDER — SENNOSIDES-DOCUSATE SODIUM 8.6-50 MG PO TABS
1.0000 | ORAL_TABLET | Freq: Every evening | ORAL | Status: DC | PRN
  Filled 2017-06-11: qty 1

## 2017-06-11 MED ORDER — SODIUM CHLORIDE 0.9 % IV SOLN
INTRAVENOUS | Status: AC
  Administered 2017-06-11 – 2017-06-12 (×2): via INTRAVENOUS

## 2017-06-11 MED ORDER — ACETAMINOPHEN 325 MG PO TABS: 650.0000 mg | ORAL_TABLET | Freq: Four times a day (QID) | ORAL | Status: DC | PRN

## 2017-06-11 MED ORDER — MEMANTINE HCL-DONEPEZIL HCL ER 28-10 MG PO CP24: 1.0000 | ORAL_CAPSULE | Freq: Every day | ORAL | Status: DC

## 2017-06-11 MED ORDER — ASPIRIN EC 325 MG PO TBEC
325.0000 mg | DELAYED_RELEASE_TABLET | Freq: Every day | ORAL | Status: DC
  Administered 2017-06-11 – 2017-06-12 (×2): 325 mg via ORAL
  Filled 2017-06-11 (×2): qty 1

## 2017-06-11 MED ORDER — ACETAMINOPHEN 650 MG RE SUPP: 650.0000 mg | Freq: Four times a day (QID) | RECTAL | Status: DC | PRN

## 2017-06-11 MED ORDER — LIDOCAINE HCL (PF) 1 % IJ SOLN
5.0000 mL | Freq: Once | INTRAMUSCULAR | Status: AC
  Administered 2017-06-11: 5 mL via INTRADERMAL

## 2017-06-11 MED ORDER — MEMANTINE HCL ER 28 MG PO CP24
28.0000 mg | ORAL_CAPSULE | Freq: Every day | ORAL | Status: DC
  Administered 2017-06-11: 28 mg via ORAL
  Filled 2017-06-11 (×2): qty 1

## 2017-06-11 MED ORDER — SODIUM CHLORIDE 0.9 % IV SOLN: INTRAVENOUS | Status: DC

## 2017-06-11 NOTE — ED Provider Notes (Signed)
Patient seen/examined in the Emergency Department in conjunction with [facility] Patient presents with delirium, he was found walking around unattended.  He has h/o dementia Exam : awake/alert, appears agitated Plan: delirium evaluation and may need admission     Ripley Fraise, MD 06/11/17 [phone]

## 2017-06-11 NOTE — ED Notes (Signed)
Attempted to call report to 5W x 1 

## 2017-06-11 NOTE — Progress Notes (Signed)
   Subjective: No acute events since admission, per daughter he seems to be complaining less about his knee pain and was able to stand in the room. She also feels he continues to be less oriented. He does not recall events leading to hospitalization. He notes seeing chronic floaters, no other complaints voiced.   Objective:  Vital signs in last 24 hours: Vitals:   06/11/17 0945 06/11/17 1000 06/11/17 1015 06/11/17 1030  BP: 136/85  (!) 141/78 130/76  Pulse: 63 64 (!) 59 69  Resp:      Temp:      TempSrc:      SpO2: 98% 96% 97% 98%   Physical Exam  Constitutional:  Elderly gentleman sitting in bed, no acute distress   HENT:  Head: Normocephalic and atraumatic.  Cardiovascular: Normal rate and regular rhythm.   Pulmonary/Chest: Effort normal and breath sounds normal. No respiratory distress.  Abdominal: Soft. He exhibits no distension. There is no tenderness.  Musculoskeletal:  Swelling and warmth to R knee, tenderness to palpation   Neurological:  Alert, oriented to self and place (able to answer hospital, city)      Assessment/Plan:  Fever, R Knee Effusion  On presentation pt found to be febrile with temp of 100.4 and may be more altered than his baseline with underlying dementia. WBC mildly elevated at 12.3. Imaging including CT head, CXR, KUB, CT Abd/P are unremarkable, U/A negative for signs of infection and no localizing symptoms reported. His R knee pain and effusion (visualized on XR, POC [place]) may be a source with septic arthritis, arthocentesis performed, labs pending. Holding antibiotics until a source or better evidence of infection is identified. May consider LP with altered mental status and fever if aspirate is negative for infection and he continues to be febrile and altered.   --Monitor vital signs --F/u aspirate cell count, gram stain, culture, crystals --CBC      Dementia, Delirium  Pt found wandering outside in the middle of the night and found by police. Pt  unable to recall these events. At baseline, daughter reports the pt is not well oriented, consistent with questioning on exam. However, she does feel his more confused than his baseline. His family is currently in the process of enrolling him into a facility for a higher level of care. This may need to be expedited given this admission. --Delirium precautions  --SW --Continue home memantine and donepezil      Acute Kidney Injury, resolved Cr on presentation 1.46 compared to baseline ~1. Felt to be pre-renal due to dehydration, appeared dry on exam. Received 1 L in ED and maintenance fluids initiated with return of his Cr to baseline at 1.13. He does have an enlarged prostate on CT scan, will monitor urine output to ensure obstruction has not contributed to his sx above.  --BMP, I/Os --Hold home benazepril-HCTZ      Dispo: Anticipated discharge in approximately 2-3 day(s).   Tawny Asal, MD 06/11/2017, 10:49 AM Pager: [phone]

## 2017-06-11 NOTE — Clinical Social Work Note (Addendum)
CSW acknowledges consult, "Hx of dementia, family noted starting process for facility placement but may need to be expedited with this admission." CSW left voicemail for social worker at [facility]. Awaiting call back.  Dayton Scrape, [place] [phone]  3:49 pm CSW spoke with social worker, Sindy Messing at [facility]. She stated she was unaware that family was looking into SNF placement. She stated there is supposed to be a family meeting today. CSW will continue to follow progress. Patient will need a PT evaluation to determination appropriateness for SNF. Please enter order.  Dayton Scrape, [place]

## 2017-06-11 NOTE — ED Notes (Signed)
Dinner tray ordered; regular diet 

## 2017-06-11 NOTE — Procedures (Signed)
Procedure Note  Indication: Right knee effusion   Operators: Drs Tawny Asal and Johnnette Gourd  The patient was provided with risks, benefits, and alternatives to intraarticular injections. He consented to right knee arthrocentesis.  POC bedside ultrasound was used to identify the moderate sized effusion. After a time out was preformed, the right knee was prepped in a sterile fashion. The insertion site (lateral superior aspect of flexed knee) was numbed with 3 cc of 1% lidocaine using a 25 gauge needle prior to arthrocentesis. An 18 gauge needle was then used to enter the joint space without difficulty, entered successfully on initial attempt. 32 cc of amber colored fluid was removed and sent to lab for analysis. The patient tolerated the procedures well without complication.   Visualized effusion in suprapatellar space:    Needle visualized within effusion:

## 2017-06-11 NOTE — H&P (Signed)
Date: 06/11/2017               Patient Name:  Justin Griffin MRN: [id number]  DOB: 06/04/1940 Age / Sex: 76 y.o., male   PCP: Golden Circle, FNP         Medical Service: Internal Medicine Teaching Service         Attending Physician: Dr. Ripley Fraise, MD    First Contact: Dr. Johny Chess Pager: [phone]  Second Contact: Dr. Philipp Ovens Pager: [phone]       After Hours (After 5p/  First Contact Pager: [phone]  weekends / holidays): Second Contact Pager: [phone]   Chief Complaint: delirium  History of Present Illness:  Justin Griffin is a 76yo male with PMH significant for dementia, CAD s/p CABG, RA, and HTN who presents with delirium. Daughter is at bedside, from whom most of the history was obtained. The patient lives at home alone and his daughters come by to help him out. He was in his usual state of health with no complaints when his daughter left his house at [place] on 9/5. The patient was found wandering outside on the streets at 1am on 9/6. He was picked up by the police who dropped him off at his daughter's house around [place] yesterday. Daughter states that at that time, he was walking differently, couldn't get himself out of the chair, and complaining of pain his right knee, so she decided to bring him to the emergency room. She also states that he seems more confused than usual but no slurred speech. She denies any recent fevers, cough, or complaints of pain prior to two days ago. On interviewing the patient, he is very confused. He does not remember anything that happened earlier or where he is currently. He states his right knee is sore. Denies chest pain, trouble breathing, headache, abdominal pain, dysuria, or fevers. Denies smoking, alcohol, or other illicit drug use.  In the ER, BP 131/81, HR 95, O2 sats >95% on RA, RR 14, found to be febrile to 100.4. Labs significant for WBC 12.3, BUN 24, Cr 1.46 (baseline 1-1.1), glucose 165. CXR, KUB, UA negative for infection. X-ray of right knee  with moderate osteoarthritis and small joint effusion. CT head negative for acute intracranial abnormalities. CT abd/pelvis negative. IMTS called for admission given new confusion and fever. Received 1L NS bolus.  At baseline, patient is not oriented x3, does ambulate by himself without a walker, and does not cook or drive. His daughters come by to set out his medication, but he takes it on his own and his daughters are unsure whether he actually takes all of his medications every day. Daughter states that they were in the process of moving him to a facility with higher level of care and have an appointment scheduled in the next few days to begin this process.  Meds:  Current Meds  Medication Sig  . aspirin EC 325 MG tablet Take 325 mg by mouth daily.  Marland Kitchen atorvastatin (LIPITOR) 80 MG tablet Take 1 tablet (80 mg total) by mouth daily.  . benazepril-hydrochlorthiazide (LOTENSIN HCT) 20-25 MG tablet Take 1 tablet by mouth daily.  . Melatonin 5 MG CAPS Take 5 mg by mouth at bedtime.  . Memantine HCl-Donepezil HCl (NAMZARIC) 28-10 MG CP24 Take 1 capsule by mouth daily.  . pantoprazole (PROTONIX) 40 MG tablet Take 1 tablet (40 mg total) by mouth daily.  . potassium chloride (K-DUR,KLOR-CON) 10 MEQ tablet take 1 tablet by mouth once  daily   Allergies: Allergies as of 06/10/2017 - Review Complete 06/10/2017  Allergen Reaction Noted  . Zocor [simvastatin] Other (See Comments) 05/03/2012   Past Medical History:  Diagnosis Date  . Alzheimer's disease   . CAD (coronary artery disease)    Catheterization 2006 LAD occluded, OM 95% stenosis followed by mid occlusion, first obtuse marginal occluded, right coronary artery diffuse moderate disease. LIMA to the LAD was patent. Saphenous vein to PDA and posterior lateral patent with diffuse luminal irregularities, saphenous vein graft to second obtuse marginal is patent, saphenous vein graft to the first obtuse marginal had 25% stenosis.  . Esophageal reflux     . Esophageal stricture   . Other and unspecified hyperlipidemia   . Other dysphagia   . Rheumatoid arthritis(714.0)   . Status post dilation of esophageal narrowing   . Unspecified essential hypertension    Family History:  Family History  Problem Relation Age of Onset  . Heart disease Maternal Grandfather   . Prostate cancer Paternal Grandfather   . Heart disease Mother    Social History: Social History   Social History  . Marital status: Widowed    Spouse name: N/A  . Number of children: 4  . Years of education: 10   Occupational History  . RETIRED    Social History Main Topics  . Smoking status: Former Smoker    Years: 0.00    Types: Cigarettes    Quit date: 10/05/1994  . Smokeless tobacco: Never Used  . Alcohol use No  . Drug use: No  . Sexual activity: Not Asked   Other Topics Concern  . None   Social History Narrative   Denies abuse and feel safe at home.   Fun: Watch TV.    Review of Systems: A complete ROS was negative except as per HPI, although limited by patient's baseline dementia.  Physical Exam: Blood pressure (!) 112/54, pulse 73, temperature (!) 100.4 F (38 C), temperature source Rectal, resp. rate 16, SpO2 98 %.   GEN: Not alert or oriented x3 (daughter states this is his baseline); lying in bed in NAD; confused, states that he cannot remember anything; able to follow commands HENT: Dry mucous membranes. No visible lesions. EYES: PERRL. Sclera anicteric. NECK: No carotid bruits. RESP: Clear to auscultation bilaterally. No wheezes, rales, or rhonchi. No increased work of breathing. CV: Normal rate and regular rhythm. No murmurs, gallops, or rubs. No LE edema. ABD: Soft. Non-tender. Non-distended. Normoactive bowel sounds. BACK: No spinal tenderness. EXT: No edema. 2+ DP and radial pulses. NEURO: Cranial nerves II-XII intact. 5/5 strength in BUE. 5/5 in LLE, 4/5 in RLE (patient states his right leg is sore and strength on exam is limited by  pain). Normal sensation in BUE and BLE. Right knee not tender to palpation. Right knee appears swollen and increased warmth compared to left knee.  EKG: sinus rhythm  CXR Low lung volumes. No acute cardiopulmonary process.  R knee x-ray Moderate osteoarthritis. No acute fracture. Small joint effusion  KUB Nonobstructive bowel gas pattern  CT head No acute intracranial abnormalities. Chronic atrophy and small vessel ischemic changes. Partial right mastoid effusions.  CT abd/pelvis No acute process demonstrated in the abdomen or pelvis. Probable hepatic cyst. Enlarged prostate gland. No abdominal abscess identified.  Assessment & Plan by Problem: Active Problems:   * No active hospital problems. *  Justin Griffin is a 76yo male with PMH significant for dementia, CAD s/p CABG, RA, and HTN who presents with  delirium after being found by the police wandering the streets. Patient is not oriented at baseline, but daughter states that he is more confused, was complaining of pain his R knee, and walking differently. Fever to 100.4, R knee swollen and warm.  # Delirium Patient has underlying dementia and according to daughter, is not oriented at baseline. It will be difficult to assess when he is back to baseline. However, daughter states that he does seem more confused than normal. CT head negative for acute intracranial abnormalities. Electrolytes wnl. Fever to 100.4. Work-up thus far without clear etiology, however there may be several contributing factors, including underlying dementia, and dehydration vs septic R knee. Patient found to have elevated BUN and Cr and with dry mucous membranes on exam, concerning for dehydration. He was apparently wandering the streets for a while at night and may have had decreased PO intake. S/p 1L NS bolus in ER. Will continue to hydrate. He also complains of soreness in his R knee, which his daughter states is unusual for him. His daughter also noted that he was  walking differently and wasn't able to get himself out of the chair which he is normally able to do. He usually ambulates without a walker. On exam, his right knee appears swollen and is more warm than the left knee. R knee x-ray does show small joint effusion and patient is febrile to 100.4. - Delirium precautions - NS 31mL/hr x12h - f/u CMP - R knee arthrocentesis in AM  # Fever to 100.4 WBC elevated to 12.3. UA negative for UTI, CT abd/pelvis without etiology, CXR & KUB negative. Right knee x-ray demonstrates small joint effusion, as well as increased warmth and swelling on exam. Unclear source of fever, but concern for septic R knee. Patient is hemodynamically stable. Holding antibiotics for now. - Continue to monitor fever curve - f/u CBC & CMP - R knee arthrocentesis in AM  # AKI Cr 1.46 on admission (baseline 1-1.10). Likely prerenal secondary to dehydration. S/p 1L NS bolus in the ER. UA with 100 protein. - f/u CMP  # HTN/CAD BP 120s-150s/50s-70s. On benazepril-HCTZ 20-25mg  daily at home - Will hold home BP medication due to AKI - Continue to monitor BP - Continue home aspirin 325mg  daily  # HLD  - Continue home atorvastatin 80mg  daily  # Dementia Patient currently lives at home alone with assistance from daughter. Will need social work assistance for placement considering that he has baseline dementia and was found wandering the streets today. - Consult social work - Warehouse manager  Diet: HH diet VTE PPx: Lovenox Code status: DNR (confirmed with daughter at bedside)  Dispo: Admit patient to Inpatient with expected length of stay greater than 2 midnights.  Signed: Colbert Ewing, MD  06/11/2017, 4:46 AM  P [phone]

## 2017-06-11 NOTE — ED Notes (Signed)
Pt's primary physician, Dr. Jimmye Norman, [phone].  Followed by [facility].  Would like updates about pt's status and if he will be admitted.

## 2017-06-11 NOTE — ED Notes (Signed)
Lunch tray ordered; regular diet 

## 2017-06-11 NOTE — Progress Notes (Signed)
Pt arrived onto unit. No c/o pain. A&Ox3. Pt instructed to call for help when needed and verbalizes understanding. Son in law present at the bedside.    Skin assessmentn done by Carolynne Edouard, RN and Elmyra Ricks, RN

## 2017-06-12 DIAGNOSIS — Z888 Allergy status to other drugs, medicaments and biological substances status: Secondary | ICD-10-CM

## 2017-06-12 DIAGNOSIS — Z79899 Other long term (current) drug therapy: Secondary | ICD-10-CM

## 2017-06-12 DIAGNOSIS — M25461 Effusion, right knee: Secondary | ICD-10-CM

## 2017-06-12 DIAGNOSIS — R509 Fever, unspecified: Secondary | ICD-10-CM

## 2017-06-12 DIAGNOSIS — E860 Dehydration: Secondary | ICD-10-CM

## 2017-06-12 DIAGNOSIS — Z9889 Other specified postprocedural states: Secondary | ICD-10-CM

## 2017-06-12 DIAGNOSIS — F0281 Dementia in other diseases classified elsewhere with behavioral disturbance: Secondary | ICD-10-CM

## 2017-06-12 DIAGNOSIS — F05 Delirium due to known physiological condition: Secondary | ICD-10-CM

## 2017-06-12 DIAGNOSIS — N179 Acute kidney failure, unspecified: Secondary | ICD-10-CM

## 2017-06-12 DIAGNOSIS — G301 Alzheimer's disease with late onset: Principal | ICD-10-CM

## 2017-06-12 DIAGNOSIS — Z9183 Wandering in diseases classified elsewhere: Secondary | ICD-10-CM

## 2017-06-12 LAB — CBC
HEMATOCRIT: 34.1 % — AB (ref 39.0–52.0)
Hemoglobin: 10.1 g/dL — ABNORMAL LOW (ref 13.0–17.0)
MCH: 21.3 pg — AB (ref 26.0–34.0)
MCHC: 29.6 g/dL — ABNORMAL LOW (ref 30.0–36.0)
MCV: 71.9 fL — AB (ref 78.0–100.0)
Platelets: 147 10*3/uL — ABNORMAL LOW (ref 150–400)
RBC: 4.74 MIL/uL (ref 4.22–5.81)
RDW: 18 % — AB (ref 11.5–15.5)
WBC: 7.5 10*3/uL (ref 4.0–10.5)

## 2017-06-12 LAB — BASIC METABOLIC PANEL
ANION GAP: 7 (ref 5–15)
BUN: 19 mg/dL (ref 6–20)
CO2: 24 mmol/L (ref 22–32)
Calcium: 8.3 mg/dL — ABNORMAL LOW (ref 8.9–10.3)
Chloride: 106 mmol/L (ref 101–111)
Creatinine, Ser: 1.02 mg/dL (ref 0.61–1.24)
GFR calc non Af Amer: >60 mL/min (ref >60)
GLUCOSE: 135 mg/dL — AB (ref 65–99)
POTASSIUM: 3.5 mmol/L (ref 3.5–5.1)
Sodium: 137 mmol/L (ref 135–145)

## 2017-06-12 NOTE — Progress Notes (Signed)
   Subjective: No acute events overnight, pt remained afebrile yesterday and through the night. No complications from arthrocentesis, states his knee is still sore but improved. He is at his baseline mental status. No other complaints this morning.    Objective:  Vital signs in last 24 hours: Vitals:   06/11/17 1552 06/11/17 1757 06/11/17 2202 06/12/17 0559  BP: 120/70 130/77 127/65 (!) 156/76  Pulse: 67 63 75 67  Resp: 16 17 18 18   Temp:  98.1 F (36.7 C) 98.6 F (37 C) 98.7 F (37.1 C)  TempSrc:  Oral Oral Oral  SpO2: 97% 97% 99% 99%  Weight:  208 lb 5.4 oz (94.5 kg)    Height:  5\' 11"  (1.803 m)     Physical Exam  Constitutional:  Elderly gentleman resting in bed, no acute distress   HENT:  Head: Normocephalic and atraumatic.  Cardiovascular: Normal rate and regular rhythm.   Pulmonary/Chest: Effort normal. No respiratory distress.  Abdominal: Soft. He exhibits no distension. There is no tenderness.  Musculoskeletal:  Interval improvement in R knee swelling, minimal tenderness, good ROM and strength of RLE   Neurological:  Alert, oriented to self only, daughter reports he is c/w baseline  Skin: Skin is warm and dry.    Assessment/Plan:  Fever, R Knee Effusion  On presentation pt found to be febrile with temp of 100.4 and may have been more altered than his baseline with underlying dementia. The pt has been afebrile since initial temperature at admission, WBC has decreased. Joint aspirate analysis is consistent with an inflammatory effusion and not convincing for infection based on cell count, negative gram stain, culture is pending. No other signs or symptoms for other source of infection and pt has improved. --Monitor vital signs --F/u aspirate culture --CBC       Dementia, Delirium  Pt found wandering outside in the middle of the night and found by police. Pt unable to recall these events. At baseline, daughter reports the pt is not well oriented, consistent with  questioning on exam. His family is currently in the process of enrolling him into a facility for a higher level of care. SW consulted to facilitate, [facility] contacted. --PT  --Delirium precautions  --Continue home memantine and donepezil      Acute Kidney Injury, resolved Cr on presentation 1.46 compared to baseline ~1. Felt to be pre-renal due to dehydration, appeared dry on exam. Received 1 L in ED and maintenance fluids initiated with return of his Cr to baseline. He does have an enlarged prostate on CT scan, though bladder scan did not show retention. Cr currently 1.02.   --BMP, I/Os     Dispo: Anticipated discharge in approximately 0-1 day(s).   Tawny Asal, MD 06/12/2017, 7:14 AM Pager: [phone]

## 2017-06-12 NOTE — Progress Notes (Signed)
Medicine attending: I examined this patient today and I concur with the evaluation and management plan as recorded by resident physician Dr. Tawny Asal which we discussed together.  76 year old man with known dementia admitted on September 6 brought in by his family when he developed increasing confusion and began to wander outside of his home.  There was initial concern for infection with low-grade fever and mild leukocytosis however he had a normal chest x-ray and urine analysis.  He had a apparent traumatic right knee effusion.  The effusion was aspirated to definitively exclude infection and was negative.  A CT scan of the brain showed no fracture or hemorrhage.  No acute abnormalities.  Chronic atrophy and small vessel disease.  Fever resolved within 24 hours of admission on observation alone and he remained afebrile for the duration of the hospital course.  Synovial fluid showed no growth.  Gram stain negative.  Crystal analysis negative. He was mildly dehydrated with BUN 24, creatinine 1.5.  Renal function normalized with hydration and at discharge September 8 BUN 19 and creatinine 1.0.  Isolated mild elevation of AST at 47, remainder of liver functions normal except for borderline decreased albumin 3.3 g percent. He is oriented to person only.  Very jovial.  Follows all commands appropriately.  However, severe deficits in memory.  He cannot tell me his date of birth.  He cannot tell me his daughter's name.  Disposition: Condition stable for discharge He has strong family support here in town.  One daughter will take him home tonight and then the other daughter will assist in management.  Family are researching memory care units for higher level of support then current ambulatory daycare program that he is in.  He will need 24-hour supervision. There were no complications. He will continue to participate in the [facility] program until further arrangements are made by the family.

## 2017-06-12 NOTE — Evaluation (Signed)
Physical Therapy Evaluation & Discharge  Patient Details Name: Justin Griffin MRN: [id number] DOB: 06/04/1940 Today's Date: 06/12/2017   History of Present Illness  Pt is a 76 y/o male admitted after being found by police wandering on the street. Per daughter pt was missing for 17 hours. PMH including but not limited to Alzheimer's and CAD.  Clinical Impression  Pt presented supine in bed with HOB elevated, awake and willing to participate in therapy session. Pt's daughter present throughout session as well. Prior to admission, pt reported that he was independent with all functional mobility and ADLs. Pt ambulated in hallway without an AD with supervision for safety. Pt would benefit from continued therapy services he receives at [facility] and 24/7 supervision for safety secondary to cognitive deficits. No further acute PT needs identified at this time. PT signing off.    Follow Up Recommendations Supervision/Assistance - 24 hour;Other (comment) (continue with [facility] services)    Equipment Recommendations  None recommended by PT    Recommendations for Other Services       Precautions / Restrictions Precautions Precautions: Fall Restrictions Weight Bearing Restrictions: No      Mobility  Bed Mobility Overal bed mobility: Modified Independent                Transfers Overall transfer level: Modified independent Equipment used: None                Ambulation/Gait Ambulation/Gait assistance: Supervision Ambulation Distance (Feet): 100 Feet Assistive device: None Gait Pattern/deviations: Step-through pattern;WFL(Within Functional Limits) Gait velocity: WFL Gait velocity interpretation: at or above normal speed for age/gender General Gait Details: no instability or LOB, supervision for safety  Stairs            Wheelchair Mobility    Modified Rankin (Stroke Patients Only)       Balance Overall balance assessment: No apparent balance deficits (not formally  assessed)                                           Pertinent Vitals/Pain Pain Assessment: No/denies pain    Home Living Family/patient expects to be discharged to:: Private residence Living Arrangements: Alone Available Help at Discharge: Family;Available PRN/intermittently Type of Home: Apartment Home Access: Level entry     Home Layout: One level Home Equipment: None Additional Comments: per daughter, pt will d/c to one of his daughter's houses and will have 24/7 provided initially until a long-term plan is developed    Prior Function Level of Independence: Independent               Hand Dominance        Extremity/Trunk Assessment   Upper Extremity Assessment Upper Extremity Assessment: Overall WFL for tasks assessed    Lower Extremity Assessment Lower Extremity Assessment: Overall WFL for tasks assessed    Cervical / Trunk Assessment Cervical / Trunk Assessment: Normal  Communication   Communication: No difficulties  Cognition Arousal/Alertness: Awake/alert Behavior During Therapy: WFL for tasks assessed/performed Overall Cognitive Status: History of cognitive impairments - at baseline                                 General Comments: pt able to follow simple commands and answered most questions appropriately; however, pt's daughter present and answering for him a lot  General Comments      Exercises     Assessment/Plan    PT Assessment All further PT needs can be met in the next venue of care  PT Problem List Decreased cognition;Decreased safety awareness       PT Treatment Interventions      PT Goals (Current goals can be found in the Care Plan section)  Acute Rehab PT Goals Patient Stated Goal: return home    Frequency     Barriers to discharge        Co-evaluation               AM-PAC PT "6 Clicks" Daily Activity  Outcome Measure Difficulty turning over in bed (including adjusting  bedclothes, sheets and blankets)?: None Difficulty moving from lying on back to sitting on the side of the bed? : None Difficulty sitting down on and standing up from a chair with arms (e.g., wheelchair, bedside commode, etc,.)?: None Help needed moving to and from a bed to chair (including a wheelchair)?: None Help needed walking in hospital room?: None Help needed climbing 3-5 steps with a railing? : None 6 Click Score: 24    End of Session   Activity Tolerance: Patient tolerated treatment well Patient left: in bed;with call bell/phone within reach;with bed alarm set;with family/visitor present Nurse Communication: Mobility status PT Visit Diagnosis: Other abnormalities of gait and mobility (R26.89)    Time: 2440-1027 PT Time Calculation (min) (ACUTE ONLY): 15 min   Charges:   PT Evaluation $PT Eval Moderate Complexity: 1 Mod     PT G Codes:        [place], PT, DPT [place] 06/12/2017, 3:58 PM

## 2017-06-12 NOTE — Discharge Summary (Signed)
Name: Justin Griffin MRN: [id number] DOB: 06/04/1940 76 y.o. PCP: Golden Circle, FNP  Date of Admission: 06/10/2017 10:16 PM Date of Discharge: 06/12/2017 Attending Physician: Lucious Groves, DO  Discharge Diagnosis: Active Problems:   Late onset Alzheimer's disease without behavioral disturbance   Delirium   Acute kidney injury ([place])   Fever   Effusion of right knee   Dehydration   Discharge Medications: Allergies as of 06/12/2017      Reactions   Zocor [simvastatin] Other (See Comments)   Muscle pain      Medication List    STOP taking these medications   traMADol 50 MG tablet Commonly known as:  ULTRAM     TAKE these medications   aspirin EC 325 MG tablet Take 325 mg by mouth daily.   atorvastatin 80 MG tablet Commonly known as:  LIPITOR Take 1 tablet (80 mg total) by mouth daily.   benazepril-hydrochlorthiazide 20-25 MG tablet Commonly known as:  LOTENSIN HCT Take 1 tablet by mouth daily.   Melatonin 5 MG Caps Take 5 mg by mouth at bedtime.   Memantine HCl-Donepezil HCl 28-10 MG Cp24 Commonly known as:  NAMZARIC Take 1 capsule by mouth daily.   pantoprazole 40 MG tablet Commonly known as:  PROTONIX Take 1 tablet (40 mg total) by mouth daily.   potassium chloride 10 MEQ tablet Commonly known as:  K-DUR,KLOR-CON take 1 tablet by mouth once daily            Discharge Care Instructions        Start     Ordered   06/12/17 0000  Discharge instructions    Comments:  -As discussed, be sure to maintain 24 hr supervision for the rest of the weekend. On Monday, [facility] will help to facilitate more long term plans.  -There are no signs that there is an infection causing his problems. He may have been dehydrated which has gotten better with fluids he received overnight.   06/12/17 1645      Disposition and follow-up:   Mr.Justin Griffin was discharged from [facility] in Good condition.  At the hospital follow up visit please  address:  1.  -Facilitate long term care needs for his dementia -Assess mental status, confirm it is c/w baseline  -Ensure continued resolution of fever, no signs of infection   2.  Labs / imaging needed at time of follow-up: None  3.  Pending labs/ test needing follow-up: None  Follow-up Appointments:   Hospital Course by problem list: Active Problems:   Late onset Alzheimer's disease without behavioral disturbance   Delirium   Acute kidney injury ([place])   Fever   Effusion of right knee   Dehydration   Fever, R Knee Effusion On presentation pt found to be febrile with temperature of 100.4 and reported to be more altered than his baseline with underlying dementia. WBC was mildly elevated, though initial workup for sites of infection was unremarkable and included U/A, CXR, KUB, CT Abd/P. He did have a R knee effusion with associated pain, and was unable to report a history of fall or trauma. An arthrocentesis was performed with fluid analysis revealing an inflammatory effusion negative for infection, subsequent culture was also negative. Given his stability and lack of localizing sx, abx were not started. He improved and remained afebrile after his initial mild temp elevation.   Dementia, Delirium Prior to admission the pt was found wandering outside in the middle of the night  and found by law enforcement. The pt was unable to recall these events. His baseline reported by daughters is that he is oriented to self only. On initial exam he was similarly disoriented but it was felt he was more confused than his baseline. He improved the following day and his daughters supported that he had returned to his baseline. His family had begun the process of seeking a facility that could provide a higher level of care. [facility] and SW were contacted to help facilitate this process. He was discharged with 24 hour supervision with his daughters through the weekend with follow up with [facility] for Monday and  subsequent facility placement. His home memantine and donepezil were continued during the admission.   Acute Kidney Injury, Dehydration Cr on presentation mildly elevated at 1.46 compared to a baseline of ~1, also with mild BUN elevation consistent with dehydration likely due to poor PO intake associated with his delirium/dementia. An enlarged prostate was noted on CT scan, urinary retention ruled out with bladder scan and intake/output monitoring. His home benazepril-HCTZ was held. He received 1 L IVF bolus and maintenance fluids overnight with return of his Cr to baseline at 1.02. His benazepril-HCTZ was resumed on discharge.    Discharge Vitals:   BP (!) 145/76 (BP Location: Right Arm)   Pulse 60   Temp 97.9 F (36.6 C) (Oral)   Resp 17   Ht 5\' 11"  (1.803 m)   Wt 208 lb 5.4 oz (94.5 kg)   SpO2 100%   BMI 29.06 kg/m   Pertinent Labs, Studies, and Procedures:  CBC    Component Value Date/Time   WBC 7.5 06/12/2017 0514   RBC 4.74 06/12/2017 0514   HGB 10.1 (L) 06/12/2017 0514   HCT 34.1 (L) 06/12/2017 0514   PLT 147 (L) 06/12/2017 0514   MCV 71.9 (L) 06/12/2017 0514   MCH 21.3 (L) 06/12/2017 0514   MCHC 29.6 (L) 06/12/2017 0514   RDW 18.0 (H) 06/12/2017 0514   BMP Latest Ref Rng & Units 06/12/2017 06/11/2017 06/10/2017  Glucose 65 - 99 mg/dL 135(H) 123(H) 165(H)  BUN 6 - 20 mg/dL 19 21(H) 24(H)  Creatinine 0.61 - 1.24 mg/dL 1.02 1.13 1.46(H)  Sodium 135 - 145 mmol/L 137 139 138  Potassium 3.5 - 5.1 mmol/L 3.5 4.0 3.7  Chloride 101 - 111 mmol/L 106 105 103  CO2 22 - 32 mmol/L 24 26 24   Calcium 8.9 - 10.3 mg/dL 8.3(L) 8.9 9.1    Discharge Instructions: Discharge Instructions    Discharge instructions    Complete by:  As directed    -As discussed, be sure to maintain 24 hr supervision for the rest of the weekend. On Monday, [facility] will help to facilitate more long term plans.  -There are no signs that there is an infection causing his problems. He may have been dehydrated which  has gotten better with fluids he received overnight.      Signed: Tawny Asal, MD 06/12/2017, 4:46 PM   Pager: [phone]

## 2017-06-16 LAB — CULTURE, BODY FLUID W GRAM STAIN -BOTTLE

## 2017-06-16 LAB — CULTURE, BODY FLUID-BOTTLE: CULTURE: NO GROWTH

## 2017-09-06 ENCOUNTER — Other Ambulatory Visit: Payer: Self-pay | Admitting: Internal Medicine

## 2017-09-06 DIAGNOSIS — R1310 Dysphagia, unspecified: Secondary | ICD-10-CM

## 2017-09-10 ENCOUNTER — Ambulatory Visit
Admission: RE | Admit: 2017-09-10 | Discharge: 2017-09-10 | Disposition: A | Discharge status: Home / Self Care | Payer: No Typology Code available for payment source | Source: Ambulatory Visit | Attending: Internal Medicine | Admitting: Internal Medicine

## 2017-09-10 DIAGNOSIS — R1310 Dysphagia, unspecified: Secondary | ICD-10-CM

## 2017-12-08 ENCOUNTER — Encounter: Payer: Self-pay | Admitting: *Deleted

## 2017-12-08 ENCOUNTER — Encounter: Payer: Self-pay | Admitting: Physician Assistant

## 2017-12-08 ENCOUNTER — Ambulatory Visit (INDEPENDENT_AMBULATORY_CARE_PROVIDER_SITE_OTHER): Payer: Medicare (Managed Care) | Admitting: Physician Assistant

## 2017-12-08 VITALS — BP 110/70 | HR 76 | Ht 68.5 in | Wt 187.0 lb

## 2017-12-08 DIAGNOSIS — K222 Esophageal obstruction: Secondary | ICD-10-CM

## 2017-12-08 DIAGNOSIS — R1310 Dysphagia, unspecified: Secondary | ICD-10-CM

## 2017-12-08 MED ORDER — OMEPRAZOLE 20 MG PO CPDR: DELAYED_RELEASE_CAPSULE | ORAL | 3 refills | Status: DC

## 2017-12-08 NOTE — Progress Notes (Signed)
Subjective:    Patient ID: Justin Griffin, male    DOB: Dec 06, 1940, 77 y.o.   MRN: [id number]  HPI Justin Griffin is a 77 year old African-American male, known to Dr. Henrene Griffin from prior procedures who is brought in today from his nursing home, [facility] with concerns for dysphagia. Patient's daughter Justin Griffin accompanies him today. She says that the nursing home staff has reported that he seems to be having difficulty eating and has complained of it hurting in his chest with swallowing recently. Patient was asked directly and denies any difficulty today. He did not know why he was here for this office visit. He did have a barium swallow done in January 2019 which shows a smooth-walled tapering of the distal esophagus creating a beaklike appearance. 13 mm barium tablet did not pass into the stomach despite swallows of water. There was some disruption of primary peristalsis and mild tertiary contractions noted as well. Patient is currently being fed a regular diet. Patient had EGD in October 2016 per Dr. Henrene Griffin for dysphagia and was found to have a peptic stricture at the Justin Griffin junction which was Justin Griffin dilated to 8 [place]. Last colonoscopy February 2015 done for heme positive stool was normal. Patient is currently on ranitidine syrup 300 mg by mouth. Review of Systems pt unable to answer accurately  Outpatient Encounter Medications as of 12/08/2017  Medication Sig  . aspirin EC 325 MG tablet Take 325 mg by mouth daily.  Marland Kitchen atorvastatin (LIPITOR) 80 MG tablet Take 1 tablet (80 mg total) by mouth daily.  . benazepril-hydrochlorthiazide (LOTENSIN HCT) 20-25 MG tablet Take 1 tablet by mouth daily.  . Melatonin 5 MG CAPS Take 5 mg by mouth at bedtime.  . Memantine HCl-Donepezil HCl (NAMZARIC) 28-10 MG CP24 Take 1 capsule by mouth daily.  Marland Kitchen omeprazole (PRILOSEC) 20 MG capsule Take 1 tablet every morning, instead of Ranitidine)Zantac).  . pantoprazole (PROTONIX) 40 MG tablet Take 1 tablet (40 mg total) by mouth daily.   . potassium chloride (K-DUR,KLOR-CON) 10 MEQ tablet take 1 tablet by mouth once daily   No facility-administered encounter medications on file as of 12/08/2017.    Allergies  Allergen Reactions  . Zocor [Simvastatin] Other (See Comments)    Muscle pain   Patient Active Problem List   Diagnosis Date Noted  . Dehydration   . Delirium 06/11/2017  . Acute kidney injury ([place]) 06/11/2017  . Fever 06/11/2017  . Effusion of right knee 06/11/2017  . Inguinal hernia 12/24/2016  . Testicular pain, left 08/07/2016  . Hemorrhoid 08/07/2016  . 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 Routine general medical examination at a health care facility 01/27/2016  . Medicare annual wellness visit, subsequent 01/27/2016  . ESSENTIAL HYPERTENSION, BENIGN 08/08/2009  . GERD 07/04/2008  . RHEUMATOID ARTHRITIS 07/04/2008  . WEIGHT LOSS-ABNORMAL 07/04/2008  . DYSPHAGIA 07/04/2008  . TOBACCO USER 07/03/2008  . POLYP, COLON 02/04/2008  . DYSLIPIDEMIA 02/04/2008  . Essential hypertension 02/04/2008  . Coronary artery disease 02/04/2008  . POLYPECTOMY, HX OF 02/04/2008  . CORONARY ARTERY BYPASS GRAFT, HX OF 02/04/2008   Social History   Socioeconomic History  . Marital status: Widowed    Spouse name: Not on file  . Number of children: 4  . Years of education: 10  . Highest education level: Not on file  Social Needs  . Financial resource strain: Not on file  . Food insecurity - worry: Not on file  . Food insecurity - inability: Not on file  .  Transportation needs - medical: Not on file  . Transportation needs - non-medical: Not on file  Occupational History  . Occupation: RETIRED  Tobacco Use  . Smoking status: Former Smoker    Years: 0.00    Types: Cigarettes    Last attempt to quit: 10/05/1994    Years since quitting: 23.1  . Smokeless tobacco: Never Used  Substance and Sexual Activity  . Alcohol use: No  . Drug use: No  . Sexual activity: Not on file    Other Topics Concern  . Not on file  Social History Narrative   Denies abuse and feel safe at home.   Fun: Watch TV.     Mr. Griffin family history includes Heart disease in his maternal grandfather and mother; Prostate cancer in his paternal grandfather.      Objective:    Vitals:   12/08/17 1043  BP: 110/70  Pulse: 76    Physical Exam ; developed elderly African-American male in no acute distress, pleasant, with Alzheimer's dementia. His daughter gives history. Blood pressure 110/70 pulse 76, height 5 foot 8, weight 187. HEENT nontraumatic normocephalic EOMI PERRLA sclera anicteric, Cardiovascular; regular rate and rhythm with S1-S2 no murmur or gallop,  a sternal incisional scar, Pulmonary ;clear bilaterally, Abdomen ;soft nontender nondistended bowel sounds are active no palpable mass or hepatosplenomegaly, Ext; no clubbing cyanosis or edema skin warm and dry, Neuropsych; the patient is ambulatory, able to answer  simple questions       Assessment & Plan:   #18 77 year old African-American male with prior history of distal esophageal stricture requiring dilation, brought in today from the nursing home with recurrent dysphagia. Barium swallow was done January 2019 showing a distal esophageal stricture with smooth tapering, some nonspecific esophageal dysmotility, and barium tablet did not pass through the area of stricture.  #2 Alzheimer's dementia #3 coronary artery disease status post CABG #4 hypertension #5 rheumatoid arthritis #6 status post right inguinal hernia repair 2018  Plan; Stop ranitidine and start omeprazole 20 mg by mouth every morning-given history of recurrent peptic stricture Patient will be scheduled for upper endoscopy with probable esophageal dilation with Dr. Scarlette Griffin. Procedure was discussed in detail with the patient's daughter including indications risks and benefits and she is agreeable to proceed. In the interim patient should be on a soft diet,  with chopped meats and vegetables.  Justin Griffin S Harriett Azar PA-C 12/08/2017   [employer]: [employer]*

## 2017-12-08 NOTE — Patient Instructions (Signed)
We have provided you with a printed prescription for Omeprazole 20 mg.  You have been scheduled for an endoscopy. Please follow written instructions given to you at your visit today. If you use inhalers (even only as needed), please bring them with you on the day of your procedure.   If you are age 77 or older, your body mass index should be between 23-30. Your Body mass index is 28.02 kg/m. If this is out of the aforementioned range listed, please consider follow up with your Primary Care Provider.

## 2017-12-13 NOTE — Progress Notes (Signed)
Assessment and plan was reviewed 

## 2017-12-21 ENCOUNTER — Ambulatory Visit (AMBULATORY_SURGERY_CENTER): Payer: Medicare (Managed Care) | Admitting: Internal Medicine

## 2017-12-21 ENCOUNTER — Other Ambulatory Visit: Payer: Self-pay

## 2017-12-21 ENCOUNTER — Encounter: Payer: Self-pay | Admitting: Internal Medicine

## 2017-12-21 VITALS — BP 116/66 | HR 64 | Temp 98.4°F | Resp 16 | Ht 68.0 in | Wt 187.0 lb

## 2017-12-21 DIAGNOSIS — R1310 Dysphagia, unspecified: Secondary | ICD-10-CM

## 2017-12-21 DIAGNOSIS — K222 Esophageal obstruction: Secondary | ICD-10-CM | POA: Diagnosis not present

## 2017-12-21 MED ORDER — SODIUM CHLORIDE 0.9 % IV SOLN: 500.0000 mL | Freq: Once | INTRAVENOUS | Status: DC

## 2017-12-21 NOTE — Op Note (Signed)
[facility] Patient Name: Justin Griffin Procedure Date: 12/21/2017 9:51 AM MRN: [id number] Endoscopist: Docia Chuck. Henrene Pastor , MD Age: 77 Referring MD:  Date of Birth: [birth year] Gender: Male Account #: [id number] Procedure:                Upper GI endoscopy, with Venia Minks dilation of the                            esophagus 1f Indications:              Dysphagia, Abnormal cine-esophagram Medicines:                Monitored Anesthesia Care Procedure:                Pre-Anesthesia Assessment:                           - Prior to the procedure, a History and Physical                            was performed, and patient medications and                            allergies were reviewed. The patient's tolerance of                            previous anesthesia was also reviewed. The risks                            and benefits of the procedure and the sedation                            options and risks were discussed with the patient.                            All questions were answered, and informed consent                            was obtained. Prior Anticoagulants: The patient has                            taken no previous anticoagulant or antiplatelet                            agents. ASA Grade Assessment: III - A patient with                            severe systemic disease. After reviewing the risks                            and benefits, the patient was deemed in                            satisfactory condition to undergo the procedure.  After obtaining informed consent, the endoscope was                            passed under direct vision. Throughout the                            procedure, the patient's blood pressure, pulse, and                            oxygen saturations were monitored continuously. The                            Endoscope was introduced through the mouth, and                            advanced to the second part  of duodenum. The upper                            GI endoscopy was accomplished without difficulty.                            The patient tolerated the procedure well. Scope In: Scope Out: Findings:                 One mild benign-appearing, intrinsic stenosis was                            found 39 cm from the incisors. This measured 1.5 cm                            (inner diameter). The scope was withdrawn. Dilation                            was performed with a Maloney dilator with no                            resistance at 7 Fr. No significant resistance. No                            heme. Tolerated well.                           The exam of the esophagus on the esophagus to be                            slightly dilated and somewhat tortuous.                           The stomach was normal, save small hiatal hernia.                           The examined duodenum was normal.  The cardia and gastric fundus were normal on                            retroflexion. Complications:            No immediate complications. Estimated Blood Loss:     Estimated blood loss: none. Impression:               - Benign-appearing esophageal stenosis. Dilated.                           - Normal stomach.                           - Normal examined duodenum.                           - No specimens collected. Recommendation:           1. Post-dilation diet                           2. Chew food extremely well. Small bites                           3. Continue medications for acid reflux                           4. Return to the care of your primary provider. Docia Chuck. Henrene Pastor, MD 12/21/2017 10:16:53 AM This report has been signed electronically.

## 2017-12-21 NOTE — Progress Notes (Signed)
Report to PACU, RN, vss, BBS= Clear.  

## 2017-12-21 NOTE — Patient Instructions (Signed)
YOU HAD AN ENDOSCOPIC PROCEDURE TODAY AT [facility]:   Refer to the procedure report that was given to you for any specific questions about what was found during the examination.  If the procedure report does not answer your questions, please call your gastroenterologist to clarify.  If you requested that your care partner not be given the details of your procedure findings, then the procedure report has been included in a sealed envelope for you to review at your convenience later.  YOU SHOULD EXPECT: Some feelings of bloating in the abdomen. Passage of more gas than usual.  Walking can help get rid of the air that was put into your GI tract during the procedure and reduce the bloating. If you had a lower endoscopy (such as a colonoscopy or flexible sigmoidoscopy) you may notice spotting of blood in your stool or on the toilet paper. If you underwent a bowel prep for your procedure, you may not have a normal bowel movement for a few days.  Please Note:  You might notice some irritation and congestion in your nose or some drainage.  This is from the oxygen used during your procedure.  There is no need for concern and it should clear up in a day or so.  SYMPTOMS TO REPORT IMMEDIATELY:    Following upper endoscopy (EGD)  Vomiting of blood or coffee ground material  New chest pain or pain under the shoulder blades  Painful or persistently difficult swallowing  New shortness of breath  Fever of 100F or higher  Black, tarry-looking stools  For urgent or emergent issues, a gastroenterologist can be reached at any hour by calling [phone].  DIET: Please see post dilation diet sheet. Clear liquids for 1 hour to start at 11:15 then you may start a soft diet at 12:15 for the rest of the day. Tomorrow you may proceed to your regular diet but small bites and chew your food extremely well..  Drink plenty of fluids but you should avoid alcoholic beverages for 24 hours.  ACTIVITY:   You should plan to take it easy for the rest of today and you should NOT DRIVE or use heavy machinery until tomorrow (because of the sedation medicines used during the test).    FOLLOW UP: Our staff will call the number listed on your records the next business day following your procedure to check on you and address any questions or concerns that you may have regarding the information given to you following your procedure. If we do not reach you, we will leave a message.  However, if you are feeling well and you are not experiencing any problems, there is no need to return our call.  We will assume that you have returned to your regular daily activities without incident.  If any biopsies were taken you will be contacted by phone or by letter within the next 1-3 weeks.  Please call us at [phone] if you have not heard about the biopsies in 3 weeks.    SIGNATURES/CONFIDENTIALITY: You and/or your care partner have signed paperwork which will be entered into your electronic medical record.  These signatures attest to the fact that that the information above on your After Visit Summary has been reviewed and is understood.  Full responsibility of the confidentiality of this discharge information lies with you and/or your care-partner.   Thank you for letting us take care of your healthcare needs today.

## 2017-12-21 NOTE — Progress Notes (Signed)
Called to room to assist during endoscopic procedure.  Patient ID and intended procedure confirmed with present staff. Received instructions for my participation in the procedure from the performing physician.  

## 2017-12-21 NOTE — Progress Notes (Signed)
Pt's states no medical or surgical changes since previsit or office visit. 

## 2017-12-22 ENCOUNTER — Telehealth: Payer: Self-pay | Admitting: *Deleted

## 2017-12-22 NOTE — Telephone Encounter (Signed)
No answer. No voicemail. 

## 2017-12-22 NOTE — Telephone Encounter (Signed)
  Follow up Call-  Call back number 12/21/2017 07/29/2015  Post procedure Call Back phone  # [phone] for [facility] that's hall he is on ask for charge nurse. [phone]  Permission to leave phone message No Yes  comments facility -  Some recent data might be hidden     Patient questions:  Do you have a fever, pain , or abdominal swelling? No. Pain Score  0 *  Have you tolerated food without any problems? Yes.    Have you been able to return to your normal activities? Yes.    Do you have any questions about your discharge instructions: Diet   No. Medications  No. Follow up visit  No.  Do you have questions or concerns about your Care? No.  Actions: * If pain score is 4 or above: No action needed, pain <4.  Spoke with charge nurse assigned to resident [employer].

## 2018-06-15 ENCOUNTER — Encounter: Payer: Self-pay | Admitting: Internal Medicine

## 2018-06-15 ENCOUNTER — Ambulatory Visit (INDEPENDENT_AMBULATORY_CARE_PROVIDER_SITE_OTHER): Payer: Medicare (Managed Care) | Admitting: Internal Medicine

## 2018-06-15 VITALS — BP 130/80 | HR 84

## 2018-06-15 DIAGNOSIS — R1310 Dysphagia, unspecified: Secondary | ICD-10-CM

## 2018-06-15 NOTE — Patient Instructions (Signed)
You have been scheduled for a Barium Esophogram at [facility] (1st floor of the hospital) on 06/27/2018 at 11:00am. Please arrive 15 minutes prior to your appointment for registration. Make certain not to have anything to eat or drink 3 hours prior to your test. If you need to reschedule for any reason, please contact radiology at [phone] to do so. __________________________________________________________________ A barium swallow is an examination that concentrates on views of the esophagus. This tends to be a double contrast exam (barium and two liquids which, when combined, create a gas to distend the wall of the oesophagus) or single contrast (non-ionic iodine based). The study is usually tailored to your symptoms so a good history is essential. Attention is paid during the study to the form, structure and configuration of the esophagus, looking for functional disorders (such as aspiration, dysphagia, achalasia, motility and reflux) EXAMINATION You may be asked to change into a gown, depending on the type of swallow being performed. A radiologist and radiographer will perform the procedure. The radiologist will advise you of the type of contrast selected for your procedure and direct you during the exam. You will be asked to stand, sit or lie in several different positions and to hold a small amount of fluid in your mouth before being asked to swallow while the imaging is performed .In some instances you may be asked to swallow barium coated marshmallows to assess the motility of a solid food bolus. The exam can be recorded as a digital or video fluoroscopy procedure. POST PROCEDURE It will take 1-2 days for the barium to pass through your system. To facilitate this, it is important, unless otherwise directed, to increase your fluids for the next 24-48hrs and to resume your normal diet.  This test typically takes about 30 minutes to  perform. __________________________________________________________________________________

## 2018-06-15 NOTE — Progress Notes (Signed)
HISTORY OF PRESENT ILLNESS:  Justin Griffin is a 77 y.o. male with progressive Alzheimer's disease who sent by his nursing home regarding swallowing problems. The patient is accompanied by his daughter. The patient can provide no history as he is nonconversant. He was seen in this office 12/08/2017 regarding concerns over dysphagia. Barium esophagram performed prior to that evaluation revealed smooth stricturing in the distal esophagus with evidence for esophageal  Dysmotility and non-passage of barium tablet. Thus, he was set up for upper endoscopy 12/21/2017. The esophagus revealed very mild narrowing of the distal esophagus. Esophagus was noted to be slightly dilated and tortuous. The remainder of the examination was normal except for a small hiatal hernia. The esophagus was dilated with a large caliber Maloney dilator measuring 47 [place]. Patient's daughter tells me that he seems to do better after that. Swallowing issues at this time are bit more vague. It appears that he ruminates and spits his food out in a basin. Not clear if he is experiencing true esophageal dysphagia. No coughing or choking spells. He is on pantoprazole 40 mg daily. Historical tells me that his memory issues have significantly worsened over the past 6 months. He was not weighed today as he was brought in the wheelchair. He had breakfast today.  REVIEW OF SYSTEMS:  All non-GI ROS negative except for poor memory  Past Medical History:  Diagnosis Date  . Alzheimer's disease   . CAD (coronary artery disease)    Catheterization 2006 LAD occluded, OM 95% stenosis followed by mid occlusion, first obtuse marginal occluded, right coronary artery diffuse moderate disease. LIMA to the LAD was patent. Saphenous vein to PDA and posterior lateral patent with diffuse luminal irregularities, saphenous vein graft to second obtuse marginal is patent, saphenous vein graft to the first obtuse marginal had 25% stenosis.  . Esophageal reflux   .  Esophageal stricture   . Other and unspecified hyperlipidemia   . Other dysphagia   . Rheumatoid arthritis(714.0)   . Status post dilation of esophageal narrowing   . Unspecified essential hypertension     Past Surgical History:  Procedure Laterality Date  . CATARACT EXTRACTION, BILATERAL    . CORONARY ARTERY BYPASS GRAFT  1996  . INGUINAL HERNIA REPAIR Right 04/05/2017   Procedure: OPEN RIGHT INGUINAL HERNIA REPAIR;  Surgeon: Alphonsa Overall, MD;  Location: [facility];  Service: General;  Laterality: Right;  . INSERTION OF MESH Right 04/05/2017   Procedure: INSERTION OF MESH;  Surgeon: Alphonsa Overall, MD;  Location: [facility];  Service: General;  Laterality: Right;  . left digit amputation      Social History [name]  reports that he quit smoking about 23 years ago. His smoking use included cigarettes. He quit after 0.00 years of use. He has never used smokeless tobacco. He reports that he does not drink alcohol or use drugs.  family history includes Heart disease in his maternal grandfather and mother; Prostate cancer in his paternal grandfather.  Allergies  Allergen Reactions  . Zocor [Simvastatin] Other (See Comments)    Muscle pain       PHYSICAL EXAMINATION: Vital signs: BP 130/80   Pulse 84 Comment: slightly irregular  Constitutional: noncommunicating elderly male, no acute distress. Holding a bag to spit in, but did not during the entire visit Psychiatric: alert and oriented x3, cooperative Eyes: extraocular movements intact, anicteric, conjunctiva pink Mouth: oral pharynx moist, no lesions Neck: supple no lymphadenopathy Cardiovascular: heart regular rate and rhythm, no murmur Lungs: clear  to auscultation bilaterally Abdomen: soft, nontender, nondistended, no obvious ascites, no peritoneal signs, normal bowel sounds, no organomegaly Rectal:omitted Extremities: no clubbing, cyanosis, or lower extremity edema bilaterally Skin: no lesions on visible extremities Neuro:  overt dementia  ASSESSMENT:  #1. Vague swallowing issues. I suspect this may be due to his dementia. He did have a very mild stricture which was dilated 6 months ago. Particularly do believe that his issues are related to this mild distal stricture which was dilated. He did have evidence for dysmotility on esophagram and endoscopy. This may be playing a part. #2. Normal colonoscopy 2015 #3. Advanced dementia  PLAN:  #1. Barium swallow with tablet. #2. If the examination is significantly abnormal with significant stricturing, could consider repeat EGD with dilation. Otherwise, he would continue with dysphagia 3-type diet  25 minutes spent face-to-face with the patient. Greater than 50% a time use for counseling (his daughter) regarding swallowing issues, differential diagnosis, and the aforementioned plan to evaluate and possibly treat

## 2018-06-27 ENCOUNTER — Ambulatory Visit (HOSPITAL_COMMUNITY)
Admission: RE | Admit: 2018-06-27 | Discharge: 2018-06-27 | Disposition: A | Discharge status: Home / Self Care | Payer: Medicare (Managed Care) | Source: Ambulatory Visit | Attending: Internal Medicine | Admitting: Internal Medicine

## 2018-06-27 DIAGNOSIS — R1310 Dysphagia, unspecified: Secondary | ICD-10-CM

## 2018-06-27 DIAGNOSIS — K224 Dyskinesia of esophagus: Secondary | ICD-10-CM | POA: Diagnosis not present

## 2018-07-21 ENCOUNTER — Ambulatory Visit: Payer: Medicare (Managed Care) | Admitting: Internal Medicine

## 2019-03-19 ENCOUNTER — Inpatient Hospital Stay (HOSPITAL_COMMUNITY): Payer: Medicare (Managed Care)

## 2019-03-19 ENCOUNTER — Inpatient Hospital Stay (HOSPITAL_COMMUNITY)
Admission: EM | Admit: 2019-03-19 | Discharge: 2019-03-24 | DRG: 640 | Disposition: A | Discharge status: Hospice | Payer: Medicare (Managed Care) | Source: Skilled Nursing Facility | Attending: Internal Medicine | Admitting: Internal Medicine

## 2019-03-19 ENCOUNTER — Emergency Department (HOSPITAL_COMMUNITY): Payer: Medicare (Managed Care)

## 2019-03-19 ENCOUNTER — Other Ambulatory Visit: Payer: Self-pay

## 2019-03-19 DIAGNOSIS — L89899 Pressure ulcer of other site, unspecified stage: Secondary | ICD-10-CM | POA: Diagnosis present

## 2019-03-19 DIAGNOSIS — Z515 Encounter for palliative care: Secondary | ICD-10-CM | POA: Diagnosis present

## 2019-03-19 DIAGNOSIS — A419 Sepsis, unspecified organism: Secondary | ICD-10-CM

## 2019-03-19 DIAGNOSIS — E876 Hypokalemia: Secondary | ICD-10-CM | POA: Diagnosis present

## 2019-03-19 DIAGNOSIS — Z8249 Family history of ischemic heart disease and other diseases of the circulatory system: Secondary | ICD-10-CM | POA: Diagnosis not present

## 2019-03-19 DIAGNOSIS — D649 Anemia, unspecified: Secondary | ICD-10-CM | POA: Diagnosis not present

## 2019-03-19 DIAGNOSIS — Z7982 Long term (current) use of aspirin: Secondary | ICD-10-CM

## 2019-03-19 DIAGNOSIS — R6510 Systemic inflammatory response syndrome (SIRS) of non-infectious origin without acute organ dysfunction: Secondary | ICD-10-CM | POA: Diagnosis present

## 2019-03-19 DIAGNOSIS — R509 Fever, unspecified: Secondary | ICD-10-CM | POA: Diagnosis present

## 2019-03-19 DIAGNOSIS — E870 Hyperosmolality and hypernatremia: Secondary | ICD-10-CM | POA: Diagnosis not present

## 2019-03-19 DIAGNOSIS — N179 Acute kidney failure, unspecified: Secondary | ICD-10-CM | POA: Diagnosis present

## 2019-03-19 DIAGNOSIS — K219 Gastro-esophageal reflux disease without esophagitis: Secondary | ICD-10-CM | POA: Diagnosis present

## 2019-03-19 DIAGNOSIS — R339 Retention of urine, unspecified: Secondary | ICD-10-CM | POA: Diagnosis present

## 2019-03-19 DIAGNOSIS — R140 Abdominal distension (gaseous): Secondary | ICD-10-CM | POA: Diagnosis not present

## 2019-03-19 DIAGNOSIS — F0280 Dementia in other diseases classified elsewhere without behavioral disturbance: Secondary | ICD-10-CM | POA: Diagnosis present

## 2019-03-19 DIAGNOSIS — Z951 Presence of aortocoronary bypass graft: Secondary | ICD-10-CM | POA: Diagnosis not present

## 2019-03-19 DIAGNOSIS — I2510 Atherosclerotic heart disease of native coronary artery without angina pectoris: Secondary | ICD-10-CM | POA: Diagnosis present

## 2019-03-19 DIAGNOSIS — Z20828 Contact with and (suspected) exposure to other viral communicable diseases: Secondary | ICD-10-CM | POA: Diagnosis present

## 2019-03-19 DIAGNOSIS — Z87891 Personal history of nicotine dependence: Secondary | ICD-10-CM

## 2019-03-19 DIAGNOSIS — I10 Essential (primary) hypertension: Secondary | ICD-10-CM | POA: Diagnosis present

## 2019-03-19 DIAGNOSIS — R6520 Severe sepsis without septic shock: Secondary | ICD-10-CM | POA: Diagnosis not present

## 2019-03-19 DIAGNOSIS — E860 Dehydration: Secondary | ICD-10-CM | POA: Diagnosis present

## 2019-03-19 DIAGNOSIS — G309 Alzheimer's disease, unspecified: Secondary | ICD-10-CM | POA: Diagnosis present

## 2019-03-19 DIAGNOSIS — Z66 Do not resuscitate: Secondary | ICD-10-CM | POA: Diagnosis present

## 2019-03-19 DIAGNOSIS — F0390 Unspecified dementia without behavioral disturbance: Secondary | ICD-10-CM

## 2019-03-19 DIAGNOSIS — K567 Ileus, unspecified: Secondary | ICD-10-CM

## 2019-03-19 DIAGNOSIS — K560 Paralytic ileus: Secondary | ICD-10-CM | POA: Diagnosis present

## 2019-03-19 DIAGNOSIS — G9341 Metabolic encephalopathy: Secondary | ICD-10-CM | POA: Diagnosis present

## 2019-03-19 DIAGNOSIS — R740 Nonspecific elevation of levels of transaminase and lactic acid dehydrogenase [LDH]: Secondary | ICD-10-CM | POA: Diagnosis present

## 2019-03-19 DIAGNOSIS — D638 Anemia in other chronic diseases classified elsewhere: Secondary | ICD-10-CM | POA: Diagnosis present

## 2019-03-19 DIAGNOSIS — E785 Hyperlipidemia, unspecified: Secondary | ICD-10-CM | POA: Diagnosis present

## 2019-03-19 DIAGNOSIS — L8990 Pressure ulcer of unspecified site, unspecified stage: Secondary | ICD-10-CM | POA: Insufficient documentation

## 2019-03-19 DIAGNOSIS — Z7189 Other specified counseling: Secondary | ICD-10-CM

## 2019-03-19 LAB — BASIC METABOLIC PANEL
Anion gap: 7 (ref 5–15)
Anion gap: 9 (ref 5–15)
BUN: 36 mg/dL — ABNORMAL HIGH (ref 8–23)
BUN: 33 mg/dL — ABNORMAL HIGH (ref 8–23)
CO2: 26 mmol/L (ref 22–32)
CO2: 25 mmol/L (ref 22–32)
Calcium: 8.5 mg/dL — ABNORMAL LOW (ref 8.9–10.3)
Calcium: 8.1 mg/dL — ABNORMAL LOW (ref 8.9–10.3)
Chloride: 127 mmol/L — ABNORMAL HIGH (ref 98–111)
Chloride: 123 mmol/L — ABNORMAL HIGH (ref 98–111)
Creatinine, Ser: 1.22 mg/dL (ref 0.61–1.24)
Creatinine, Ser: 1.16 mg/dL (ref 0.61–1.24)
GFR calc Af Amer: >60 mL/min (ref >60)
GFR calc Af Amer: >60 mL/min (ref >60)
GFR calc non Af Amer: 57 mL/min — ABNORMAL LOW (ref >60)
GFR calc non Af Amer: >60 mL/min (ref >60)
Glucose, Bld: 159 mg/dL — ABNORMAL HIGH (ref 70–99)
Glucose, Bld: 167 mg/dL — ABNORMAL HIGH (ref 70–99)
Potassium: 3.3 mmol/L — ABNORMAL LOW (ref 3.5–5.1)
Potassium: 3.4 mmol/L — ABNORMAL LOW (ref 3.5–5.1)
Sodium: 160 mmol/L — ABNORMAL HIGH (ref 135–145)
Sodium: 157 mmol/L — ABNORMAL HIGH (ref 135–145)

## 2019-03-19 LAB — URINALYSIS, ROUTINE W REFLEX MICROSCOPIC
Bacteria, UA: NONE SEEN
Glucose, UA: NEGATIVE mg/dL
Hgb urine dipstick: NEGATIVE
Ketones, ur: NEGATIVE mg/dL
Leukocytes,Ua: NEGATIVE
Nitrite: NEGATIVE
Protein, ur: 100 mg/dL — AB
Specific Gravity, Urine: 1.029 (ref 1.005–1.030)
pH: 5 (ref 5.0–8.0)

## 2019-03-19 LAB — CBC WITH DIFFERENTIAL/PLATELET
Abs Immature Granulocytes: 0.05 10*3/uL (ref 0.00–0.07)
Abs Immature Granulocytes: 0.05 10*3/uL (ref 0.00–0.07)
Basophils Absolute: 0.1 10*3/uL (ref 0.0–0.1)
Basophils Absolute: 0.1 10*3/uL (ref 0.0–0.1)
Basophils Relative: 1 %
Basophils Relative: 1 %
Eosinophils Absolute: 0 10*3/uL (ref 0.0–0.5)
Eosinophils Absolute: 0 10*3/uL (ref 0.0–0.5)
Eosinophils Relative: 0 %
Eosinophils Relative: 0 %
HCT: 41.6 % (ref 39.0–52.0)
HCT: 37.3 % — ABNORMAL LOW (ref 39.0–52.0)
Hemoglobin: 11.5 g/dL — ABNORMAL LOW (ref 13.0–17.0)
Hemoglobin: 10.2 g/dL — ABNORMAL LOW (ref 13.0–17.0)
Immature Granulocytes: 1 %
Immature Granulocytes: 1 %
Lymphocytes Relative: 18 %
Lymphocytes Relative: 19 %
Lymphs Abs: 1.7 10*3/uL (ref 0.7–4.0)
Lymphs Abs: 2 10*3/uL (ref 0.7–4.0)
MCH: 20.2 pg — ABNORMAL LOW (ref 26.0–34.0)
MCH: 20.4 pg — ABNORMAL LOW (ref 26.0–34.0)
MCHC: 27.6 g/dL — ABNORMAL LOW (ref 30.0–36.0)
MCHC: 27.3 g/dL — ABNORMAL LOW (ref 30.0–36.0)
MCV: 73.2 fL — ABNORMAL LOW (ref 80.0–100.0)
MCV: 74.7 fL — ABNORMAL LOW (ref 80.0–100.0)
Monocytes Absolute: 1 10*3/uL (ref 0.1–1.0)
Monocytes Absolute: 1.2 10*3/uL — ABNORMAL HIGH (ref 0.1–1.0)
Monocytes Relative: 10 %
Monocytes Relative: 11 %
Neutro Abs: 6.9 10*3/uL (ref 1.7–7.7)
Neutro Abs: 7.2 10*3/uL (ref 1.7–7.7)
Neutrophils Relative %: 70 %
Neutrophils Relative %: 68 %
Platelets: 326 10*3/uL (ref 150–400)
Platelets: 250 10*3/uL (ref 150–400)
RBC: 5.68 MIL/uL (ref 4.22–5.81)
RBC: 4.99 MIL/uL (ref 4.22–5.81)
RDW: 21.6 % — ABNORMAL HIGH (ref 11.5–15.5)
RDW: 21.5 % — ABNORMAL HIGH (ref 11.5–15.5)
WBC: 9.8 10*3/uL (ref 4.0–10.5)
WBC: 10.5 10*3/uL (ref 4.0–10.5)
nRBC: 0.3 % — ABNORMAL HIGH (ref 0.0–0.2)
nRBC: 0.3 % — ABNORMAL HIGH (ref 0.0–0.2)

## 2019-03-19 LAB — COMPREHENSIVE METABOLIC PANEL
ALT: 80 U/L — ABNORMAL HIGH (ref 0–44)
AST: 99 U/L — ABNORMAL HIGH (ref 15–41)
Albumin: 3 g/dL — ABNORMAL LOW (ref 3.5–5.0)
Alkaline Phosphatase: 65 U/L (ref 38–126)
Anion gap: 15 (ref 5–15)
BUN: 37 mg/dL — ABNORMAL HIGH (ref 8–23)
CO2: 25 mmol/L (ref 22–32)
Calcium: 9 mg/dL (ref 8.9–10.3)
Chloride: 122 mmol/L — ABNORMAL HIGH (ref 98–111)
Creatinine, Ser: 1.36 mg/dL — ABNORMAL HIGH (ref 0.61–1.24)
GFR calc Af Amer: 58 mL/min — ABNORMAL LOW (ref >60)
GFR calc non Af Amer: 50 mL/min — ABNORMAL LOW (ref >60)
Glucose, Bld: 171 mg/dL — ABNORMAL HIGH (ref 70–99)
Potassium: 3.4 mmol/L — ABNORMAL LOW (ref 3.5–5.1)
Sodium: 162 mmol/L (ref 135–145)
Total Bilirubin: 1 mg/dL (ref 0.3–1.2)
Total Protein: 8.5 g/dL — ABNORMAL HIGH (ref 6.5–8.1)

## 2019-03-19 LAB — FERRITIN: Ferritin: 205 ng/mL (ref 24–336)

## 2019-03-19 LAB — CBC
HCT: 38.1 % — ABNORMAL LOW (ref 39.0–52.0)
Hemoglobin: 10.5 g/dL — ABNORMAL LOW (ref 13.0–17.0)
MCH: 20.2 pg — ABNORMAL LOW (ref 26.0–34.0)
MCHC: 27.6 g/dL — ABNORMAL LOW (ref 30.0–36.0)
MCV: 73.4 fL — ABNORMAL LOW (ref 80.0–100.0)
Platelets: 263 10*3/uL (ref 150–400)
RBC: 5.19 MIL/uL (ref 4.22–5.81)
RDW: 21.5 % — ABNORMAL HIGH (ref 11.5–15.5)
WBC: 11 10*3/uL — ABNORMAL HIGH (ref 4.0–10.5)
nRBC: 0.4 % — ABNORMAL HIGH (ref 0.0–0.2)

## 2019-03-19 LAB — PROCALCITONIN: Procalcitonin: 0.18 ng/mL

## 2019-03-19 LAB — SARS CORONAVIRUS 2 BY RT PCR (HOSPITAL ORDER, PERFORMED IN ~~LOC~~ HOSPITAL LAB): SARS Coronavirus 2: NEGATIVE

## 2019-03-19 LAB — IRON AND TIBC
Iron: 35 ug/dL — ABNORMAL LOW (ref 45–182)
Saturation Ratios: 16 % — ABNORMAL LOW (ref 17.9–39.5)
TIBC: 220 ug/dL — ABNORMAL LOW (ref 250–450)
UIBC: 185 ug/dL

## 2019-03-19 LAB — MAGNESIUM: Magnesium: 2.5 mg/dL — ABNORMAL HIGH (ref 1.7–2.4)

## 2019-03-19 LAB — LACTIC ACID, PLASMA
Lactic Acid, Venous: 2.8 mmol/L (ref 0.5–1.9)
Lactic Acid, Venous: 2 mmol/L (ref 0.5–1.9)

## 2019-03-19 LAB — PROTIME-INR
INR: 1.3 — ABNORMAL HIGH (ref 0.8–1.2)
Prothrombin Time: 15.6 seconds — ABNORMAL HIGH (ref 11.4–15.2)

## 2019-03-19 LAB — APTT: aPTT: 30 seconds (ref 24–36)

## 2019-03-19 LAB — VITAMIN B12: Vitamin B-12: 645 pg/mL (ref 180–914)

## 2019-03-19 LAB — PHOSPHORUS: Phosphorus: 4.1 mg/dL (ref 2.5–4.6)

## 2019-03-19 LAB — FOLATE: Folate: 22 ng/mL (ref >5.9)

## 2019-03-19 LAB — MRSA PCR SCREENING: MRSA by PCR: NEGATIVE

## 2019-03-19 MED ORDER — DEXTROSE 5 % IV SOLN
INTRAVENOUS | Status: DC
  Administered 2019-03-19 – 2019-03-23 (×9): via INTRAVENOUS

## 2019-03-19 MED ORDER — ACETAMINOPHEN 650 MG RE SUPP: 650.0000 mg | Freq: Four times a day (QID) | RECTAL | Status: DC | PRN

## 2019-03-19 MED ORDER — PANTOPRAZOLE SODIUM 40 MG PO TBEC: 40.0000 mg | DELAYED_RELEASE_TABLET | Freq: Every day | ORAL | Status: DC

## 2019-03-19 MED ORDER — DONEPEZIL HCL 10 MG PO TABS: 10.0000 mg | ORAL_TABLET | Freq: Every day | ORAL | Status: DC

## 2019-03-19 MED ORDER — PIPERACILLIN-TAZOBACTAM 3.375 G IVPB
3.3750 g | Freq: Once | INTRAVENOUS | Status: AC
  Administered 2019-03-19: 3.375 g via INTRAVENOUS
  Filled 2019-03-19: qty 50

## 2019-03-19 MED ORDER — SODIUM CHLORIDE 0.9 % IV BOLUS
1000.0000 mL | Freq: Once | INTRAVENOUS | Status: AC
  Administered 2019-03-19: 1000 mL via INTRAVENOUS

## 2019-03-19 MED ORDER — SENNOSIDES-DOCUSATE SODIUM 8.6-50 MG PO TABS: 1.0000 | ORAL_TABLET | Freq: Every evening | ORAL | Status: DC | PRN

## 2019-03-19 MED ORDER — VANCOMYCIN HCL 10 G IV SOLR
1250.0000 mg | Freq: Every day | INTRAVENOUS | Status: DC
  Administered 2019-03-20: 1250 mg via INTRAVENOUS
  Filled 2019-03-19 (×2): qty 1250

## 2019-03-19 MED ORDER — MAGNESIUM CITRATE PO SOLN: 1.0000 | Freq: Once | ORAL | Status: DC | PRN

## 2019-03-19 MED ORDER — RISPERIDONE 0.25 MG PO TABS
0.1250 mg | ORAL_TABLET | Freq: Two times a day (BID) | ORAL | Status: DC
  Filled 2019-03-19 (×4): qty 1

## 2019-03-19 MED ORDER — VALPROIC ACID 250 MG/5ML PO SOLN
250.0000 mg | Freq: Four times a day (QID) | ORAL | Status: DC
  Administered 2019-03-21: 17:00:00 250 mg via ORAL
  Filled 2019-03-19 (×23): qty 5

## 2019-03-19 MED ORDER — SODIUM CHLORIDE 0.9 % IV SOLN
2.0000 g | Freq: Two times a day (BID) | INTRAVENOUS | Status: DC
  Administered 2019-03-19 – 2019-03-22 (×7): 2 g via INTRAVENOUS
  Filled 2019-03-19 (×7): qty 2

## 2019-03-19 MED ORDER — PANTOPRAZOLE SODIUM 40 MG IV SOLR
40.0000 mg | Freq: Every day | INTRAVENOUS | Status: DC
  Administered 2019-03-19 – 2019-03-24 (×6): 40 mg via INTRAVENOUS
  Filled 2019-03-19 (×6): qty 40

## 2019-03-19 MED ORDER — RIVASTIGMINE TARTRATE 3 MG PO CAPS: 4.5000 mg | ORAL_CAPSULE | Freq: Two times a day (BID) | ORAL | Status: DC

## 2019-03-19 MED ORDER — BISACODYL 5 MG PO TBEC: 5.0000 mg | DELAYED_RELEASE_TABLET | Freq: Every day | ORAL | Status: DC | PRN

## 2019-03-19 MED ORDER — CHLORHEXIDINE GLUCONATE CLOTH 2 % EX PADS
6.0000 | MEDICATED_PAD | Freq: Every day | CUTANEOUS | Status: DC
  Administered 2019-03-19 – 2019-03-24 (×6): 6 via TOPICAL

## 2019-03-19 MED ORDER — LATANOPROST 0.005 % OP SOLN
1.0000 [drp] | Freq: Every day | OPHTHALMIC | Status: DC
  Administered 2019-03-19 – 2019-03-23 (×4): 1 [drp] via OPHTHALMIC
  Filled 2019-03-19: qty 2.5

## 2019-03-19 MED ORDER — SODIUM CHLORIDE 0.9 % IV SOLN
INTRAVENOUS | Status: DC | PRN
  Administered 2019-03-19: 08:00:00 250 mL via INTRAVENOUS

## 2019-03-19 MED ORDER — ONDANSETRON HCL 4 MG PO TABS: 4.0000 mg | ORAL_TABLET | Freq: Four times a day (QID) | ORAL | Status: DC | PRN

## 2019-03-19 MED ORDER — MEMANTINE HCL-DONEPEZIL HCL ER 28-10 MG PO CP24: 1.0000 | ORAL_CAPSULE | Freq: Two times a day (BID) | ORAL | Status: DC

## 2019-03-19 MED ORDER — HYDROCHLOROTHIAZIDE 25 MG PO TABS: 25.0000 mg | ORAL_TABLET | Freq: Every day | ORAL | Status: DC

## 2019-03-19 MED ORDER — SODIUM CHLORIDE 0.45 % IV SOLN
INTRAVENOUS | Status: DC
  Administered 2019-03-19: 07:00:00 via INTRAVENOUS

## 2019-03-19 MED ORDER — OXYCODONE HCL 5 MG PO TABS: 5.0000 mg | ORAL_TABLET | ORAL | Status: DC | PRN

## 2019-03-19 MED ORDER — ACETAMINOPHEN 325 MG PO TABS: 650.0000 mg | ORAL_TABLET | Freq: Four times a day (QID) | ORAL | Status: DC | PRN

## 2019-03-19 MED ORDER — RIVASTIGMINE TARTRATE 3 MG PO CAPS
4.5000 mg | ORAL_CAPSULE | Freq: Two times a day (BID) | ORAL | Status: DC
  Filled 2019-03-19 (×11): qty 1

## 2019-03-19 MED ORDER — SODIUM CHLORIDE 0.9 % IV SOLN
2.0000 g | Freq: Once | INTRAVENOUS | Status: DC
  Filled 2019-03-19: qty 2

## 2019-03-19 MED ORDER — POTASSIUM CHLORIDE 10 MEQ/100ML IV SOLN
10.0000 meq | INTRAVENOUS | Status: AC
  Administered 2019-03-19 (×4): 10 meq via INTRAVENOUS
  Filled 2019-03-19 (×4): qty 100

## 2019-03-19 MED ORDER — VANCOMYCIN HCL IN DEXTROSE 1-5 GM/200ML-% IV SOLN
1000.0000 mg | Freq: Once | INTRAVENOUS | Status: DC
  Filled 2019-03-19: qty 200

## 2019-03-19 MED ORDER — ENOXAPARIN SODIUM 40 MG/0.4ML ~~LOC~~ SOLN
40.0000 mg | SUBCUTANEOUS | Status: DC
  Administered 2019-03-19 – 2019-03-24 (×6): 40 mg via SUBCUTANEOUS
  Filled 2019-03-19 (×6): qty 0.4

## 2019-03-19 MED ORDER — METRONIDAZOLE IN NACL 5-0.79 MG/ML-% IV SOLN
500.0000 mg | Freq: Three times a day (TID) | INTRAVENOUS | Status: DC
  Administered 2019-03-19 – 2019-03-24 (×16): 500 mg via INTRAVENOUS
  Filled 2019-03-19 (×16): qty 100

## 2019-03-19 MED ORDER — VANCOMYCIN HCL 10 G IV SOLR
1500.0000 mg | Freq: Once | INTRAVENOUS | Status: AC
  Administered 2019-03-19: 1500 mg via INTRAVENOUS
  Filled 2019-03-19: qty 1500

## 2019-03-19 MED ORDER — ALBUTEROL SULFATE (2.5 MG/3ML) 0.083% IN NEBU: 2.5000 mg | INHALATION_SOLUTION | Freq: Four times a day (QID) | RESPIRATORY_TRACT | Status: DC | PRN

## 2019-03-19 MED ORDER — ACETAMINOPHEN 650 MG RE SUPP
650.0000 mg | Freq: Once | RECTAL | Status: AC
  Administered 2019-03-19: 650 mg via RECTAL
  Filled 2019-03-19: qty 1

## 2019-03-19 MED ORDER — MEMANTINE HCL ER 28 MG PO CP24: 28.0000 mg | ORAL_CAPSULE | Freq: Every day | ORAL | Status: DC

## 2019-03-19 MED ORDER — ONDANSETRON HCL 4 MG/2ML IJ SOLN: 4.0000 mg | Freq: Four times a day (QID) | INTRAMUSCULAR | Status: DC | PRN

## 2019-03-19 MED ORDER — SODIUM CHLORIDE 0.45 % IV BOLUS
1000.0000 mL | Freq: Once | INTRAVENOUS | Status: AC
  Administered 2019-03-19: 1000 mL via INTRAVENOUS

## 2019-03-19 MED ORDER — SENNOSIDES-DOCUSATE SODIUM 8.6-50 MG PO TABS
2.0000 | ORAL_TABLET | Freq: Every day | ORAL | Status: DC
  Filled 2019-03-19 (×2): qty 2

## 2019-03-19 MED ORDER — SENNA-DOCUSATE SODIUM 8.6-50 MG PO TABS: 2.0000 | ORAL_TABLET | Freq: Every day | ORAL | Status: DC

## 2019-03-19 MED ORDER — IPRATROPIUM BROMIDE 0.02 % IN SOLN: 0.5000 mg | Freq: Four times a day (QID) | RESPIRATORY_TRACT | Status: DC | PRN

## 2019-03-19 MED ORDER — POTASSIUM CHLORIDE CRYS ER 20 MEQ PO TBCR: 40.0000 meq | EXTENDED_RELEASE_TABLET | Freq: Once | ORAL | Status: DC

## 2019-03-19 MED ORDER — ATORVASTATIN CALCIUM 40 MG PO TABS: 40.0000 mg | ORAL_TABLET | Freq: Every day | ORAL | Status: DC

## 2019-03-19 MED ORDER — VANCOMYCIN HCL IN DEXTROSE 1-5 GM/200ML-% IV SOLN: 1000.0000 mg | Freq: Once | INTRAVENOUS | Status: DC

## 2019-03-19 MED ORDER — ORAL CARE MOUTH RINSE
15.0000 mL | Freq: Two times a day (BID) | OROMUCOSAL | Status: DC
  Administered 2019-03-19 – 2019-03-24 (×10): 15 mL via OROMUCOSAL

## 2019-03-19 MED ORDER — CITALOPRAM HYDROBROMIDE 20 MG PO TABS: 20.0000 mg | ORAL_TABLET | Freq: Every day | ORAL | Status: DC

## 2019-03-19 MED ORDER — ASPIRIN EC 81 MG PO TBEC
81.0000 mg | DELAYED_RELEASE_TABLET | Freq: Every day | ORAL | Status: DC
  Filled 2019-03-19 (×2): qty 1

## 2019-03-19 NOTE — ED Notes (Signed)
Called report to 5th floor. RN needed to talk to Charge RN to see if they would accept pt due to MEWS score being 7.  RN will call back to notify if they will be accepting pt.

## 2019-03-19 NOTE — ED Triage Notes (Signed)
Pt BIB [facility] from [facility]. Pt has DNR and MOST form at bedside. EMS reports pt is lethargic and altered level of consciousness. Pt will arouse to painful stimuli, respirations 30-35 then periods of apnea.   Pt has large blister on Right Thumb that extends to his wrist.  Pt has received 333mL of NS.

## 2019-03-19 NOTE — H&P (Addendum)
History and Physical   [facility] [place] [facility] [place] Admission History and Physical [employer], D.O.    Patient Name: Justin Griffin MR#: [id number] Date of Birth: 05/14/1941 Date of Admission: 03/19/2019  Referring MD/NP/PA: Dr. Stark Jock Primary Care Physician: [place]  Chief Complaint:  Chief Complaint  Patient presents with  . Fever  Please note the entire history is obtained from the patient's emergency department chart, emergency department provider. Patient's personal history is limited by dementia and altered mental status.   HPI: Justin Griffin is a 78 y.o. male with a known history of dementia, CAD, RA, HLD presents to the emergency department for evaluation of Fever.  Patient was in a usual state of health at his nursing home but was sent to ER for evaluation of fever.  No known focal symptoms. .   Otherwise there has been no change in status. Patient has been taking medication as prescribed and there has been no recent change in medication or diet.  No recent antibiotics.  There has been no recent illness, hospitalizations, travel or sick contacts.    EMS/ED Course: Patient received Zosyn/Vanco. Medical admission has been requested for further management of Sepsis, unclear source, hypovolemic hypernatremia.  Review of Systems:  Unable to obtain 2/2 dementia   Past Medical History:  Diagnosis Date  . Alzheimer's disease   . CAD (coronary artery disease)    Catheterization 2006 LAD occluded, OM 95% stenosis followed by mid occlusion, first obtuse marginal occluded, right coronary artery diffuse moderate disease. LIMA to the LAD was patent. Saphenous vein to PDA and posterior lateral patent with diffuse luminal irregularities, saphenous vein graft to second obtuse marginal is patent, saphenous vein graft to the first obtuse marginal had 25% stenosis.  . Esophageal reflux   . Esophageal stricture   . Other and  unspecified hyperlipidemia   . Other dysphagia   . Rheumatoid arthritis(714.0)   . Status post dilation of esophageal narrowing   . Unspecified essential hypertension     Past Surgical History:  Procedure Laterality Date  . CATARACT EXTRACTION, BILATERAL    . CORONARY ARTERY BYPASS GRAFT  1996  . INGUINAL HERNIA REPAIR Right 04/05/2017   Procedure: OPEN RIGHT INGUINAL HERNIA REPAIR;  Surgeon: Alphonsa Overall, MD;  Location: [facility];  Service: General;  Laterality: Right;  . INSERTION OF MESH Right 04/05/2017   Procedure: INSERTION OF MESH;  Surgeon: Alphonsa Overall, MD;  Location: [facility];  Service: General;  Laterality: Right;  . left digit amputation       reports that he quit smoking about 24 years ago. His smoking use included cigarettes. He quit after 0.00 years of use. He has never used smokeless tobacco. He reports that he does not drink alcohol or use drugs.  Allergies  Allergen Reactions  . Zocor [Simvastatin] Other (See Comments)    Muscle pain    Family History  Problem Relation Age of Onset  . Heart disease Maternal Grandfather   . Prostate cancer Paternal Grandfather   . Heart disease Mother   . Colon cancer Neg Hx   . Esophageal cancer Neg Hx   . Stomach cancer Neg Hx   . Pancreatic cancer Neg Hx   . Liver disease Neg Hx     Prior to Admission medications   Medication Sig Start Date End Date Taking? Authorizing Provider  alum & mag hydroxide-simeth (MYLANTA MAXIMUM STRENGTH) 400-400-40 MG/5ML suspension Take 5 ml every 4 hours as  needed for indigestion    [provider]  aspirin EC 81 MG tablet Take 81 mg by mouth daily.    [provider]  atorvastatin (LIPITOR) 40 MG tablet Take 40 mg by mouth daily.    [provider]  citalopram (CELEXA) 20 MG tablet Take 20 mg by mouth daily.    [provider]  hydrochlorothiazide (HYDRODIURIL) 25 MG tablet Take 25 mg by mouth daily.    [provider]  latanoprost (XALATAN) 0.005 %  ophthalmic solution Place 1 drop into both eyes at bedtime.    [provider]  Memantine HCl-Donepezil HCl (NAMZARIC) 28-10 MG CP24 Take 1 capsule by mouth daily. Patient taking differently: Take 1 capsule by mouth 2 (two) times daily.  12/07/16   Tomi Likens, Adam R, DO  pantoprazole (PROTONIX) 40 MG tablet Take 40 mg by mouth daily.    [provider]  risperiDONE (RISPERDAL) 0.25 MG tablet Take 0.125 mg by mouth 2 (two) times daily.    [provider]  rivastigmine (EXELON) 4.5 MG capsule Take 4.5 mg by mouth 2 (two) times daily.    [provider]  sennosides-docusate sodium (SENOKOT-S) 8.6-50 MG tablet Take 2 tablets by mouth daily.    [provider]  Valproate Sodium (VALPROIC ACID) 250 MG/5ML SOLN Take 5 mLs by mouth. Take 5 ml's by mouth, 4 times daily.    [provider]    Physical Exam: Vitals:   03/19/19 0245 03/19/19 0300 03/19/19 0315 03/19/19 0334  BP: 138/82 (!) 160/93 126/87   Pulse: (!) 117 (!) 111 (!) 107   Resp: (!) 34 (!) 29 (!) 35   Temp:    (!) 101.8 F (38.8 C)  TempSrc:    Rectal  SpO2: 97% 97% 97%     GENERAL: 78 y.o.-year-old male patient, chronically ill appearing lying in the bed in no acute distress.  Lethargic.  Somnolent, snoring with periods of apnea. Responds to painful stimuli.  HEENT: Head atraumatic, normocephalic. Pupils equal. Mucus membranes dry NECK: Supple. No JVD. CHEST: Normal breath sounds bilaterally. No wheezing, rales, rhonchi or crackles. No use of accessory muscles of respiration.  No reproducible chest wall tenderness.  CARDIOVASCULAR: S1, S2 normal. No murmurs, rubs, or gallops.  ABDOMEN: Soft, nondistended, nontender. No rebound, guarding, rigidity.  EXTREMITIES: No pedal edema, cyanosis, or clubbing. No calf tenderness or Homan's sign.  NEUROLOGIC: The patient is somnolent, responds to painful stimuli.   PSYCHIATRIC:  Normal affect, mood, thought content. SKIN: Well healed right medial  leg scar.  Right thumb blister.  Otherwise Warm, dry, and intact without obvious rash, lesion, or ulcer.    Labs on Admission:  CBC: Recent Labs  Lab 03/19/19 0155  WBC 9.8  NEUTROABS 6.9  HGB 11.5*  HCT 41.6  MCV 73.2*  PLT 161   Basic Metabolic Panel: Recent Labs  Lab 03/19/19 0155  NA 162*  K 3.4*  CL 122*  CO2 25  GLUCOSE 171*  BUN 37*  CREATININE 1.36*  CALCIUM 9.0   GFR: CrCl cannot be calculated (Unknown ideal weight.). Liver Function Tests: Recent Labs  Lab 03/19/19 0155  AST 99*  ALT 80*  ALKPHOS 65  BILITOT 1.0  PROT 8.5*  ALBUMIN 3.0*   No results for input(s): LIPASE, AMYLASE in the last 168 hours. No results for input(s): AMMONIA in the last 168 hours. Coagulation Profile: No results for input(s): INR, PROTIME in the last 168 hours. Cardiac Enzymes: No results for input(s): CKTOTAL, CKMB,  CKMBINDEX, TROPONINI in the last 168 hours. BNP (last 3 results) No results for input(s): PROBNP in the last 8760 hours. HbA1C: No results for input(s): HGBA1C in the last 72 hours. CBG: No results for input(s): GLUCAP in the last 168 hours. Lipid Profile: No results for input(s): CHOL, HDL, LDLCALC, TRIG, CHOLHDL, LDLDIRECT in the last 72 hours. Thyroid Function Tests: No results for input(s): TSH, T4TOTAL, FREET4, T3FREE, THYROIDAB in the last 72 hours. Anemia Panel: No results for input(s): VITAMINB12, FOLATE, FERRITIN, TIBC, IRON, RETICCTPCT in the last 72 hours. Urine analysis:    Component Value Date/Time   COLORURINE AMBER (A) 06/10/2017 2311   APPEARANCEUR HAZY (A) 06/10/2017 2311   LABSPEC 1.031 (H) 06/10/2017 2311   PHURINE 5.0 06/10/2017 2311   GLUCOSEU 50 (A) 06/10/2017 2311   HGBUR NEGATIVE 06/10/2017 2311   BILIRUBINUR NEGATIVE 06/10/2017 2311   KETONESUR NEGATIVE 06/10/2017 2311   PROTEINUR 100 (A) 06/10/2017 2311   NITRITE NEGATIVE 06/10/2017 2311   LEUKOCYTESUR NEGATIVE 06/10/2017 2311   Sepsis  Labs: @LABRCNTIP (procalcitonin:4,lacticidven:4) ) Recent Results (from the past 240 hour(s))  SARS Coronavirus 2 (CEPHEID- Performed in [place] hospital lab), Hosp Order     Status: None   Collection Time: 03/19/19  2:05 AM   Specimen: Nasopharyngeal Swab  Result Value Ref Range Status   SARS Coronavirus 2 NEGATIVE NEGATIVE Final    Comment: (NOTE) If result is NEGATIVE SARS-CoV-2 target nucleic acids are NOT DETECTED. The SARS-CoV-2 RNA is generally detectable in upper and lower  respiratory specimens during the acute phase of infection. The lowest  concentration of SARS-CoV-2 viral copies this assay can detect is 250  copies / mL. A negative result does not preclude SARS-CoV-2 infection  and should not be used as the sole basis for treatment or other  patient management decisions.  A negative result may occur with  improper specimen collection / handling, submission of specimen other  than nasopharyngeal swab, presence of viral mutation(s) within the  areas targeted by this assay, and inadequate number of viral copies  (<250 copies / mL). A negative result must be combined with clinical  observations, patient history, and epidemiological information. If result is POSITIVE SARS-CoV-2 target nucleic acids are DETECTED. The SARS-CoV-2 RNA is generally detectable in upper and lower  respiratory specimens dur ing the acute phase of infection.  Positive  results are indicative of active infection with SARS-CoV-2.  Clinical  correlation with patient history and other diagnostic information is  necessary to determine patient infection status.  Positive results do  not rule out bacterial infection or co-infection with other viruses. If result is PRESUMPTIVE POSTIVE SARS-CoV-2 nucleic acids MAY BE PRESENT.   A presumptive positive result was obtained on the submitted specimen  and confirmed on repeat testing.  While 2019 novel coronavirus  (SARS-CoV-2) nucleic acids may be present in  the submitted sample  additional confirmatory testing may be necessary for epidemiological  and / or clinical management purposes  to differentiate between  SARS-CoV-2 and other Sarbecovirus currently known to infect humans.  If clinically indicated additional testing with an alternate test  methodology [phone]) is advised. The SARS-CoV-2 RNA is generally  detectable in upper and lower respiratory sp ecimens during the acute  phase of infection. The expected result is Negative. Fact Sheet for Patients:  StrictlyIdeas.no Fact Sheet for Healthcare Providers: BankingDealers.co.za This test is not yet approved or cleared by the [place] FDA and has been authorized for detection and/or diagnosis of SARS-CoV-2 by FDA  under an Emergency Use Authorization (EUA).  This EUA will remain in effect (meaning this test can be used) for the duration of the COVID-19 declaration under Section 564(b)(1) of the Act, 21 U.S.C. section 360bbb-3(b)(1), unless the authorization is terminated or revoked sooner. Performed at [facility], [place] [address], [place], [place] [address]      Radiological Exams on Admission: Dg Chest Port 1 View  Result Date: 03/19/2019 CLINICAL DATA:  Fever EXAM: PORTABLE CHEST 1 VIEW COMPARISON:  06/10/2017 FINDINGS: Prior CABG. Heart and mediastinal contours are within normal limits. No focal opacities or effusions. No acute bony abnormality. IMPRESSION: No active disease. Electronically Signed   By: Rolm Baptise M.D.   On: 03/19/2019 02:54    EKG: Sinus tach at 125 bpm with leftward axis and nonspecific ST-T wave changes.   Assessment/Plan  This is a 78 y.o. male with a history of dementia, CAD, RA, HLD now being admitted with:  #. Sepsis secondary to unclear source - Admit to inpatient with telemetry monitoring - IV antibiotics: CefepimeVanco/Flagyl - IV fluid hydration - Follow up blood,urine &  sputum cultures - Trend lactate - Repeat CBC in am.   #. Hypovolemic hypernatremia - IV fluid hydration 1/2 NS  #. Acute kidney injury  - IV fluids and repeat BMP in AM.  - Avoid nephrotoxic medications - Bladder scan and place foley catheter if evidence of urinary retention  #. History of GERD - Continue Protonix  #. History of dementia - Continue Celexa, Memantine, Risperdal, Exelon  #. History of HLD - Continue Lipitor  #. History of HTN - Continue HCTZ  Admission status: Inpatient, stepdown IV Fluids: 1/2NS Diet/Nutrition: NPO Consults called: None  DVT Px: Lovenox, SCDs and early ambulation. Code Status: DNR Disposition Plan: To be determined.  All the records are reviewed and case discussed with ED provider.   Addie Cederberg D.O. on 03/19/2019 at 3:39 AM CC: Primary care physician; [employer], [place]   03/19/2019, 3:39 AM

## 2019-03-19 NOTE — Progress Notes (Addendum)
PROGRESS NOTE    Justin Griffin  TTS:[id number] DOB: September 29, 1941 DOA: 03/19/2019 PCP: [facility] [place]   Brief Narrative:  HPI per Dr. Rosamaria Lints note the entire history is obtained from the patient's emergency department chart, emergency department provider. Patient's personal history is limited by dementia and altered mental status.  Justin Griffin is a 78 y.o. male with a known history of dementia, CAD, RA, HLD presents to the emergency department for evaluation of Fever.  Patient was in a usual state of health at his nursing home but was sent to ER for evaluation of fever.  No known focal symptoms. .   Otherwise there has been no change in status. Patient has been taking medication as prescribed and there has been no recent change in medication or diet.  No recent antibiotics.  There has been no recent illness, hospitalizations, travel or sick contacts.    EMS/ED Course: Patient received Zosyn/Vanco. Medical admission has been requested for further management of Sepsis, unclear source, hypovolemic hypernatremia.  Assessment & Plan:   Principal Problem:   Sepsis ([place]) Active Problems:   Acute hypernatremia   Essential hypertension, benign   Coronary artery disease   GERD   CORONARY ARTERY BYPASS GRAFT, HX OF   Acute kidney injury ([place])   Dehydration   Hypokalemia   Anemia  1 sepsis Questionable etiology.  Patient met criteria for sepsis on admission with fevers, leukocytosis, elevated lactic acid level of 2.8.  Urinalysis nitrite negative, leukocytes negative.  Chest x-ray negative.  Blood cultures ordered and pending.  SARS COVID negative.  Continue empiric IV vancomycin, IV cefepime, IV Flagyl, IV fluids, supportive care.  Trend lactic acid level.  Follow.  2.  Hypovolemic hypernatremia Likely secondary to significant volume depletion/dehydration.  Change IV fluids to D5W.  Follow.  3.  Acute kidney injury Likely secondary to a prerenal  azotemia.  This is nitrite negative, leukocytes negative, glucose negative, 100 protein.  Renal function improving with hydration.  Continue IV fluids.  Strict I's and O's.  Daily weights.  Follow.  4.  Dehydration IV fluids.  5.  Hypokalemia Replete.  6.  Anemia Likely anemia of chronic disease.  Patient with no overt bleeding.  Check an anemia panel.  Follow H&H.  7.  Gastroesophageal reflux disease PPI.  8.  History of dementia Continue Celexa, memantine, Depakote, Exelon  9.  Hyperlipidemia Hold statin.  10.  Transaminitis Likely secondary to dehydration.  Will check an acute hepatitis panel.  Hold statin.  Follow.  11.  Hypertension Continue current regimen of HCTZ.  12.  Coronary artery disease Stable.  Continue aspirin, HCTZ.  Discontinue statin due to transaminitis.  13.  Acute metabolic encephalopathy Likely multifactorial secondary to problems #1, 2.   DVT prophylaxis: Lovenox Code Status: DNR Family Communication: No family at bedside. Disposition Plan: Remain in stepdown unit.   Consultants:   None  Procedures:   Chest x-ray 03/19/2019  Antimicrobials:   IV cefepime 03/19/2019  IV Flagyl 03/19/2019  IV vancomycin 03/19/2019   Subjective: Patient asleep.  Patient resting comfortably.  Objective: Vitals:   03/19/19 0733 03/19/19 0800 03/19/19 0855 03/19/19 0900  BP:  130/73  139/84  Pulse:  95  89  Resp:  20  (!) 22  Temp:   97.8 F (36.6 C)   TempSrc:   Oral   SpO2:  99%  96%  Weight: 75.8 kg       Intake/Output Summary (Last 24 hours) at 03/19/2019  5284 Last data filed at 03/19/2019 0927 Gross per 24 hour  Intake 1362.68 ml  Output -  Net 1362.68 ml   Filed Weights   03/19/19 0733  Weight: 75.8 kg    Examination:  General exam: Asleep.  Resting comfortably.  Dry mucous membranes. Respiratory system: Clear to auscultation anterior lung fields. Respiratory effort normal. Cardiovascular system: S1 & S2 heard, RRR. No JVD,  murmurs, rubs, gallops or clicks. No pedal edema. Gastrointestinal system: Abdomen is nondistended, soft and nontender. No organomegaly or masses felt. Normal bowel sounds heard. Central nervous system: Resting comfortably.  Moving extremities spontaneously.. No focal neurological deficits. Extremities: Symmetric 5 x 5 power. Skin: No rashes, lesions or ulcers Psychiatry: Judgement and insight unable to be assessed as patient asleep.. Mood & affect appropriate.     Data Reviewed: I have personally reviewed following labs and imaging studies  CBC: Recent Labs  Lab 03/19/19 0155 03/19/19 0535 03/19/19 0645  WBC 9.8 11.0* 10.5  NEUTROABS 6.9  --  7.2  HGB 11.5* 10.5* 10.2*  HCT 41.6 38.1* 37.3*  MCV 73.2* 73.4* 74.7*  PLT 326 263 132   Basic Metabolic Panel: Recent Labs  Lab 03/19/19 0155 03/19/19 0535  NA 162* 160*  K 3.4* 3.3*  CL 122* 127*  CO2 25 26  GLUCOSE 171* 159*  BUN 37* 36*  CREATININE 1.36* 1.22  CALCIUM 9.0 8.5*  MG  --  2.5*  PHOS  --  4.1   GFR: CrCl cannot be calculated (Unknown ideal weight.). Liver Function Tests: Recent Labs  Lab 03/19/19 0155  AST 99*  ALT 80*  ALKPHOS 65  BILITOT 1.0  PROT 8.5*  ALBUMIN 3.0*   No results for input(s): LIPASE, AMYLASE in the last 168 hours. No results for input(s): AMMONIA in the last 168 hours. Coagulation Profile: Recent Labs  Lab 03/19/19 0535  INR 1.3*   Cardiac Enzymes: No results for input(s): CKTOTAL, CKMB, CKMBINDEX, TROPONINI in the last 168 hours. BNP (last 3 results) No results for input(s): PROBNP in the last 8760 hours. HbA1C: No results for input(s): HGBA1C in the last 72 hours. CBG: No results for input(s): GLUCAP in the last 168 hours. Lipid Profile: No results for input(s): CHOL, HDL, LDLCALC, TRIG, CHOLHDL, LDLDIRECT in the last 72 hours. Thyroid Function Tests: No results for input(s): TSH, T4TOTAL, FREET4, T3FREE, THYROIDAB in the last 72 hours. Anemia Panel: No results for  input(s): VITAMINB12, FOLATE, FERRITIN, TIBC, IRON, RETICCTPCT in the last 72 hours. Sepsis Labs: Recent Labs  Lab 03/19/19 0155 03/19/19 0427 03/19/19 0535  PROCALCITON  --   --  0.18  LATICACIDVEN 2.8* 2.0*  --     Recent Results (from the past 240 hour(s))  Blood Culture (routine x 2)     Status: None (Preliminary result)   Collection Time: 03/19/19  1:55 AM   Specimen: BLOOD  Result Value Ref Range Status   Specimen Description   Final    BLOOD RIGHT ANTECUBITAL Performed at [facility], [place] [address] [place], [place] [address]    Special Requests   Final    BOTTLES DRAWN AEROBIC AND ANAEROBIC Blood Culture adequate volume Performed at [place] [address] [place], [place] [address]    Culture   Final    NO GROWTH < 12 HOURS Performed at [place] [address], [place], [place] [address]    Report Status PENDING  Incomplete  SARS Coronavirus 2 (CEPHEID- Performed in [facility] hospital lab), Hosp Order     Status: None   Collection Time: 03/19/19  2:05 AM   Specimen: Nasopharyngeal Swab  Result Value Ref Range Status   SARS Coronavirus 2 NEGATIVE NEGATIVE Final    Comment: (NOTE) If result is NEGATIVE SARS-CoV-2 target nucleic acids are NOT DETECTED. The SARS-CoV-2 RNA is generally detectable in upper and lower  respiratory specimens during the acute phase of infection. The lowest  concentration of SARS-CoV-2 viral copies this assay can detect is 250  copies / mL. A negative result does not preclude SARS-CoV-2 infection  and should not be used as the sole basis for treatment or other  patient management decisions.  A negative result may occur with  improper specimen collection / handling, submission of specimen other  than nasopharyngeal swab, presence of viral mutation(s) within the  areas targeted by this assay, and inadequate number of viral copies  (<250 copies / mL). A negative result must be  combined with clinical  observations, patient history, and epidemiological information. If result is POSITIVE SARS-CoV-2 target nucleic acids are DETECTED. The SARS-CoV-2 RNA is generally detectable in upper and lower  respiratory specimens dur ing the acute phase of infection.  Positive  results are indicative of active infection with SARS-CoV-2.  Clinical  correlation with patient history and other diagnostic information is  necessary to determine patient infection status.  Positive results do  not rule out bacterial infection or co-infection with other viruses. If result is PRESUMPTIVE POSTIVE SARS-CoV-2 nucleic acids MAY BE PRESENT.   A presumptive positive result was obtained on the submitted specimen  and confirmed on repeat testing.  While 2019 novel coronavirus  (SARS-CoV-2) nucleic acids may be present in the submitted sample  additional confirmatory testing may be necessary for epidemiological  and / or clinical management purposes  to differentiate between  SARS-CoV-2 and other Sarbecovirus currently known to infect humans.  If clinically indicated additional testing with an alternate test  methodology [phone]) is advised. The SARS-CoV-2 RNA is generally  detectable in upper and lower respiratory sp ecimens during the acute  phase of infection. The expected result is Negative. Fact Sheet for Patients:  StrictlyIdeas.no Fact Sheet for Healthcare Providers: BankingDealers.co.za This test is not yet approved or cleared by the [place] FDA and has been authorized for detection and/or diagnosis of SARS-CoV-2 by FDA under an Emergency Use Authorization (EUA).  This EUA will remain in effect (meaning this test can be used) for the duration of the COVID-19 declaration under Section 564(b)(1) of the Act, 21 U.S.C. section 360bbb-3(b)(1), unless the authorization is terminated or revoked sooner. Performed at [facility], [place] [address], [place], [place] [address]   MRSA PCR Screening     Status: None   Collection Time: 03/19/19  6:45 AM   Specimen: Nasal Mucosa; Nasopharyngeal  Result Value Ref Range Status   MRSA by PCR NEGATIVE NEGATIVE Final    Comment:        The GeneXpert MRSA Assay (FDA approved for NASAL specimens only), is one component of a comprehensive MRSA colonization surveillance program. It is not intended to diagnose MRSA infection nor to guide or monitor treatment for MRSA infections. Performed at [facility], [place] [address], [place], [place] [address]          Radiology Studies: Dg Chest Port 1 View  Result Date: 03/19/2019 CLINICAL DATA:  Fever.  Sepsis.  Coronary artery disease. EXAM: PORTABLE CHEST 1 VIEW COMPARISON:  03/19/2019 FINDINGS: The heart size and mediastinal contours are within normal limits. Prior CABG again noted. Both lungs are clear. The visualized skeletal structures are unremarkable. IMPRESSION: No active disease. Electronically Signed   By: Earle Gell M.D.   On: 03/19/2019 07:40   Dg Chest Port 1 View  Result Date: 03/19/2019 CLINICAL DATA:  Fever EXAM: PORTABLE CHEST 1 VIEW COMPARISON:  06/10/2017 FINDINGS: Prior CABG. Heart and mediastinal contours are within normal limits. No focal opacities or effusions. No acute bony abnormality. IMPRESSION: No active disease. Electronically Signed   By: Rolm Baptise M.D.   On: 03/19/2019 02:54        Scheduled Meds: . aspirin EC  81 mg Oral Daily  . atorvastatin  40 mg Oral Daily  . Chlorhexidine Gluconate Cloth  6 each Topical Daily  . citalopram  20 mg Oral Daily  . memantine  28 mg Oral QHS   And  . donepezil  10 mg Oral QHS  . enoxaparin (LOVENOX) injection  40 mg Subcutaneous Q24H  . hydrochlorothiazide  25 mg Oral Daily  . latanoprost  1 drop Both Eyes QHS  . mouth rinse  15 mL Mouth Rinse BID  . pantoprazole  40 mg Oral Daily  . potassium chloride  40 mEq Oral Once   . risperiDONE  0.125 mg Oral BID  . rivastigmine  4.5 mg Oral BID  . senna-docusate  2 tablet Oral Daily  . valproic acid  250 mg Oral QID   Continuous Infusions: . sodium chloride 10 mL/hr at 03/19/19 0927  . ceFEPime (MAXIPIME) IV    . dextrose 100 mL/hr at 03/19/19 0927  . metronidazole Stopped (03/19/19 0920)  . [START ON 03/20/2019] vancomycin       LOS: 0 days    Time spent: 40 minutes  No charge    Irine Seal, MD [facility]  If 7PM-7AM, please contact night-coverage [email or web address] 03/19/2019, 9:49 AM

## 2019-03-19 NOTE — ED Provider Notes (Addendum)
[facility] DEPT Provider Note   CSN: [id number] Arrival date & time: 03/19/19  0119     History   Chief Complaint Chief Complaint  Patient presents with  . Fever    HPI Justin Griffin is a 78 y.o. male.     Patient is a 78 year old male with past medical history of dementia, coronary artery disease, rheumatoid, hypertension.  He was sent from his extended care facility for evaluation of altered level of consciousness.  He was found to have a fever of 103.7, then transported here.  Patient is somnolent and does not had any useful additional history.  The history is provided by the patient.  Fever Max temp prior to arrival:  103 Severity:  Moderate Timing:  Constant Progression:  Worsening Chronicity:  New Relieved by:  Nothing Worsened by:  Nothing Ineffective treatments:  None tried   Past Medical History:  Diagnosis Date  . Alzheimer's disease   . CAD (coronary artery disease)    Catheterization 2006 LAD occluded, OM 95% stenosis followed by mid occlusion, first obtuse marginal occluded, right coronary artery diffuse moderate disease. LIMA to the LAD was patent. Saphenous vein to PDA and posterior lateral patent with diffuse luminal irregularities, saphenous vein graft to second obtuse marginal is patent, saphenous vein graft to the first obtuse marginal had 25% stenosis.  . Esophageal reflux   . Esophageal stricture   . Other and unspecified hyperlipidemia   . Other dysphagia   . Rheumatoid arthritis(714.0)   . Status post dilation of esophageal narrowing   . Unspecified essential hypertension     Patient Active Problem List   Diagnosis Date Noted  . Dehydration   . Delirium 06/11/2017  . Acute kidney injury ([facility]) 06/11/2017  . Fever 06/11/2017  . Effusion of right knee 06/11/2017  . Inguinal hernia 12/24/2016  . Testicular pain, left 08/07/2016  . Hemorrhoid 08/07/2016  . Late onset Alzheimer's disease without behavioral  disturbance ([place]) 07/24/2016  . Routine general medical examination at a health care facility 01/27/2016  . Medicare annual wellness visit, subsequent 01/27/2016  . ESSENTIAL HYPERTENSION, BENIGN 08/08/2009  . GERD 07/04/2008  . RHEUMATOID ARTHRITIS 07/04/2008  . WEIGHT LOSS-ABNORMAL 07/04/2008  . DYSPHAGIA 07/04/2008  . TOBACCO USER 07/03/2008  . POLYP, COLON 02/04/2008  . DYSLIPIDEMIA 02/04/2008  . Essential hypertension 02/04/2008  . Coronary artery disease 02/04/2008  . POLYPECTOMY, HX OF 02/04/2008  . CORONARY ARTERY BYPASS GRAFT, HX OF 02/04/2008    Past Surgical History:  Procedure Laterality Date  . CATARACT EXTRACTION, BILATERAL    . CORONARY ARTERY BYPASS GRAFT  1996  . INGUINAL HERNIA REPAIR Right 04/05/2017   Procedure: OPEN RIGHT INGUINAL HERNIA REPAIR;  Surgeon: Alphonsa Overall, MD;  Location: [facility];  Service: General;  Laterality: Right;  . INSERTION OF MESH Right 04/05/2017   Procedure: INSERTION OF MESH;  Surgeon: Alphonsa Overall, MD;  Location: [facility];  Service: General;  Laterality: Right;  . left digit amputation          Home Medications    Prior to Admission medications   Medication Sig Start Date End Date Taking? Authorizing Provider  alum & mag hydroxide-simeth (MYLANTA MAXIMUM STRENGTH) 400-400-40 MG/5ML suspension Take 5 ml every 4 hours as needed for indigestion    [provider]  aspirin EC 81 MG tablet Take 81 mg by mouth daily.    [provider]  atorvastatin (LIPITOR) 40 MG tablet Take 40 mg by mouth daily.  [provider]  citalopram (CELEXA) 20 MG tablet Take 20 mg by mouth daily.    [provider]  hydrochlorothiazide (HYDRODIURIL) 25 MG tablet Take 25 mg by mouth daily.    [provider]  latanoprost (XALATAN) 0.005 % ophthalmic solution Place 1 drop into both eyes at bedtime.    [provider]  Memantine HCl-Donepezil HCl (NAMZARIC) 28-10 MG CP24 Take 1 capsule by mouth daily. Patient  taking differently: Take 1 capsule by mouth 2 (two) times daily.  12/07/16   Tomi Likens, Adam R, DO  pantoprazole (PROTONIX) 40 MG tablet Take 40 mg by mouth daily.    [provider]  risperiDONE (RISPERDAL) 0.25 MG tablet Take 0.125 mg by mouth 2 (two) times daily.    [provider]  rivastigmine (EXELON) 4.5 MG capsule Take 4.5 mg by mouth 2 (two) times daily.    [provider]  sennosides-docusate sodium (SENOKOT-S) 8.6-50 MG tablet Take 2 tablets by mouth daily.    [provider]  Valproate Sodium (VALPROIC ACID) 250 MG/5ML SOLN Take 5 mLs by mouth. Take 5 ml's by mouth, 4 times daily.    [provider]    Family History Family History  Problem Relation Age of Onset  . Heart disease Maternal Grandfather   . Prostate cancer Paternal Grandfather   . Heart disease Mother   . Colon cancer Neg Hx   . Esophageal cancer Neg Hx   . Stomach cancer Neg Hx   . Pancreatic cancer Neg Hx   . Liver disease Neg Hx     Social History Social History   Tobacco Use  . Smoking status: Former Smoker    Years: 0.00    Types: Cigarettes    Quit date: 10/05/1994    Years since quitting: 24.4  . Smokeless tobacco: Never Used  Substance Use Topics  . Alcohol use: No  . Drug use: No     Allergies   Zocor [simvastatin]   Review of Systems Review of Systems  Unable to perform ROS: Acuity of condition  Constitutional: Positive for fever.     Physical Exam Updated Vital Signs BP (!) 146/95 (BP Location: Left Arm)   Pulse (!) 127   Temp (!) 103.7 F (39.8 C) (Rectal)   Resp (!) 32   SpO2 99%   Physical Exam Vitals signs and nursing note reviewed.  Constitutional:      General: He is not in acute distress.    Appearance: He is well-developed. He is ill-appearing. He is not diaphoretic.     Comments: Patient is somnolent with snoring respirations.  He appears acutely on chronically ill.  He is unable to offer any history secondary to acuity of  condition.  HENT:     Head: Normocephalic and atraumatic.  Neck:     Musculoskeletal: Normal range of motion and neck supple.  Cardiovascular:     Rate and Rhythm: Normal rate and regular rhythm.     Heart sounds: No murmur. No friction rub.  Pulmonary:     Effort: Pulmonary effort is normal. No respiratory distress.     Breath sounds: Rhonchi present. No wheezing or rales.  Abdominal:     General: Bowel sounds are normal. There is no distension.     Palpations: Abdomen is soft.     Tenderness: There is no abdominal tenderness.  Musculoskeletal: Normal range of motion.  Skin:    General: Skin is warm and dry.  Neurological:     Comments:  Patient is somnolent.  He will respond to sternal rub, then returns to somnolence.      ED Treatments / Results  Labs (all labs ordered are listed, but only abnormal results are displayed) Labs Reviewed  CULTURE, BLOOD (ROUTINE X 2)  CULTURE, BLOOD (ROUTINE X 2)  SARS CORONAVIRUS 2 (HOSPITAL ORDER, [facility])  LACTIC ACID, PLASMA  LACTIC ACID, PLASMA  COMPREHENSIVE METABOLIC PANEL  CBC WITH DIFFERENTIAL/PLATELET  URINALYSIS, ROUTINE W REFLEX MICROSCOPIC    EKG EKG Interpretation  Date/Time:  Sunday March 19 2019 01:49:29 EDT Ventricular Rate:  125 PR Interval:    QRS Duration: 95 QT Interval:  308 QTC Calculation: 445 R Axis:   -22 Text Interpretation:  Sinus tachycardia Probable left atrial enlargement Borderline left axis deviation Borderline repolarization abnormality Confirmed by Veryl Speak [phone]) on 03/19/2019 2:00:07 AM   Radiology No results found.  Procedures Procedures (including critical care time)  Medications Ordered in ED Medications - No data to display   Initial Impression / Assessment and Plan / ED Course  I have reviewed the triage vital signs and the nursing notes.  Pertinent labs & imaging results that were available during my care of the patient were reviewed by me and  considered in my medical decision making (see chart for details).  Patient sent here from his extended care facility for altered level of consciousness and fever.  Patient's initial temperature upon arrival is 103.7.  Septic work-up was initiated including blood cultures and urinalysis, urine culture, and chest x-ray.  Thus far these tests have been unremarkable and included COVID-19.  Electrolytes reveal a sodium of 162 and acute kidney injury.  Patient likely dehydrated and normal saline was initiated.  Vanco and Zosyn given for broad-spectrum coverage.    Care was discussed with the hospitalist who agrees to admit.  CRITICAL CARE Performed by: Veryl Speak Total critical care time: 35 minutes Critical care time was exclusive of separately billable procedures and treating other patients. Critical care was necessary to treat or prevent imminent or life-threatening deterioration. Critical care was time spent personally by me on the following activities: development of treatment plan with patient and/or surrogate as well as nursing, discussions with consultants, evaluation of patient's response to treatment, examination of patient, obtaining history from patient or surrogate, ordering and performing treatments and interventions, ordering and review of laboratory studies, ordering and review of radiographic studies, pulse oximetry and re-evaluation of patient's condition.   Final Clinical Impressions(s) / ED Diagnoses   Final diagnoses:  None    ED Discharge Orders    None       Veryl Speak, MD 03/19/19 8527    Veryl Speak, MD 03/19/19 0345

## 2019-03-19 NOTE — ED Notes (Signed)
Attempted to call report, RN was in a contact room. Will try back in 5 min.

## 2019-03-19 NOTE — Progress Notes (Signed)
PT Cancellation Note  Patient Details Name: Justin Griffin MRN: [id number] DOB: 23-Apr-1941   Cancelled Treatment:     Cancel medical today pre nurse pt very lethargic and sodium levels high.  Will assess tomorrow .    Clide Dales 03/19/2019, 1:54 PM  Clide Dales, PT Acute Rehabilitation Services Pager: [phone] Office: [phone] 03/19/2019

## 2019-03-19 NOTE — ED Notes (Signed)
Called daughter, Destry Dauber with update.

## 2019-03-19 NOTE — ED Notes (Signed)
Bed: GU44 Expected date:  Expected time:  Means of arrival:  Comments: 12M AMS/Code Sepsis

## 2019-03-19 NOTE — ED Notes (Signed)
ED TO INPATIENT HANDOFF REPORT  Name/Age/Gender Justin Griffin 78 y.o. male  Code Status Code Status History    Date Active Date Inactive Code Status Order ID Comments User Context   06/11/2017 1337 06/12/2017 2033 DNR [id number]  Ledell Noss, MD ED   Advance Care Planning Activity    Questions for Most Recent Historical Code Status (Order [id number])    Question Answer Comment   In the event of cardiac or respiratory ARREST Do not call a "code blue"    In the event of cardiac or respiratory ARREST Do not perform Intubation, CPR, defibrillation or ACLS    In the event of cardiac or respiratory ARREST Use medication by any route, position, wound care, and other measures to relive pain and suffering. May use oxygen, suction and manual treatment of airway obstruction as needed for comfort.       Home/SNF/Other Skilled nursing facility  Chief Complaint sepsis   Level of Care/Admitting Diagnosis ED Disposition    ED Disposition Condition Comment   Admit  Hospital Area: [facility] [[id number]]  Level of Care: Stepdown [14]  Admit to SDU based on following criteria: Other see comments  Comments: Sepsis  Covid Evaluation: Confirmed COVID Negative  Diagnosis: Sepsis [facility]) [[id number]]  Admitting Physician: Harvie Bridge [[id number]]  Attending Physician: Harvie Bridge [[id number]]  Estimated length of stay: past midnight tomorrow  Certification:: I certify this patient will need inpatient services for at least 2 midnights  PT Class (Do Not Modify): Inpatient [101]  PT Acc Code (Do Not Modify): Private [1]       Medical History Past Medical History:  Diagnosis Date  . Alzheimer's disease   . CAD (coronary artery disease)    Catheterization 2006 LAD occluded, OM 95% stenosis followed by mid occlusion, first obtuse marginal occluded, right coronary artery diffuse moderate disease. LIMA to the LAD was patent. Saphenous vein to PDA and posterior lateral patent with  diffuse luminal irregularities, saphenous vein graft to second obtuse marginal is patent, saphenous vein graft to the first obtuse marginal had 25% stenosis.  . Esophageal reflux   . Esophageal stricture   . Other and unspecified hyperlipidemia   . Other dysphagia   . Rheumatoid arthritis(714.0)   . Status post dilation of esophageal narrowing   . Unspecified essential hypertension     Allergies Allergies  Allergen Reactions  . Zocor [Simvastatin] Other (See Comments)    Muscle pain    IV Location/Drains/Wounds Patient Lines/Drains/Airways Status   Active Line/Drains/Airways    Name:   Placement date:   Placement time:   Site:   Days:   Peripheral IV 03/19/19 Left Antecubital   03/19/19    0129    Antecubital   less than 1   Peripheral IV 03/19/19 Right Antecubital   03/19/19    0153    Antecubital   less than 1          Labs/Imaging Results for orders placed or performed during the hospital encounter of 03/19/19 (from the past 48 hour(s))  Lactic acid, plasma     Status: Abnormal   Collection Time: 03/19/19  1:55 AM  Result Value Ref Range   Lactic Acid, Venous 2.8 (HH) 0.5 - 1.9 mmol/L    Comment: CRITICAL RESULT CALLED TO, READ BACK BY AND VERIFIED WITH: JACKSON AT 5284 ON 03/19/2019 BY JPM Performed at [facility], [place] [address] [place], [place] [address]   Comprehensive metabolic panel     Status:  Abnormal   Collection Time: 03/19/19  1:55 AM  Result Value Ref Range   Sodium 162 (HH) 135 - 145 mmol/L    Comment: CRITICAL RESULT CALLED TO, READ BACK BY AND VERIFIED WITH: Hyman Crossan,K AT 0250 ON 03/19/2019 BY JPM    Potassium 3.4 (L) 3.5 - 5.1 mmol/L   Chloride 122 (H) 98 - 111 mmol/L   CO2 25 22 - 32 mmol/L   Glucose, Bld 171 (H) 70 - 99 mg/dL   BUN 37 (H) 8 - 23 mg/dL   Creatinine, Ser 1.36 (H) 0.61 - 1.24 mg/dL   Calcium 9.0 8.9 - 10.3 mg/dL   Total Protein 8.5 (H) 6.5 - 8.1 g/dL   Albumin 3.0 (L) 3.5 - 5.0 g/dL   AST 99 (H) 15 - 41 U/L    ALT 80 (H) 0 - 44 U/L   Alkaline Phosphatase 65 38 - 126 U/L   Total Bilirubin 1.0 0.3 - 1.2 mg/dL   GFR calc non Af Amer 50 (L) >60 mL/min   GFR calc Af Amer 58 (L) >60 mL/min   Anion gap 15 5 - 15    Comment: Performed at [facility], [place] [address], [place], [place] [address]  CBC WITH DIFFERENTIAL     Status: Abnormal   Collection Time: 03/19/19  1:55 AM  Result Value Ref Range   WBC 9.8 4.0 - 10.5 K/uL   RBC 5.68 4.22 - 5.81 MIL/uL   Hemoglobin 11.5 (L) 13.0 - 17.0 g/dL   HCT 41.6 39.0 - 52.0 %   MCV 73.2 (L) 80.0 - 100.0 fL   MCH 20.2 (L) 26.0 - 34.0 pg   MCHC 27.6 (L) 30.0 - 36.0 g/dL   RDW 21.6 (H) 11.5 - 15.5 %   Platelets 326 150 - 400 K/uL   nRBC 0.3 (H) 0.0 - 0.2 %   Neutrophils Relative % 70 %   Neutro Abs 6.9 1.7 - 7.7 K/uL   Lymphocytes Relative 18 %   Lymphs Abs 1.7 0.7 - 4.0 K/uL   Monocytes Relative 10 %   Monocytes Absolute 1.0 0.1 - 1.0 K/uL   Eosinophils Relative 0 %   Eosinophils Absolute 0.0 0.0 - 0.5 K/uL   Basophils Relative 1 %   Basophils Absolute 0.1 0.0 - 0.1 K/uL   WBC Morphology MILD LEFT SHIFT (1-5% METAS, OCC MYELO, OCC BANDS)    Immature Granulocytes 1 %   Abs Immature Granulocytes 0.05 0.00 - 0.07 K/uL   Polychromasia PRESENT    Target Cells PRESENT     Comment: Performed at [facility], [place] [address] [place], [place] [address]  SARS Coronavirus 2 (CEPHEID- Performed in [facility] hospital lab), Hosp Order     Status: None   Collection Time: 03/19/19  2:05 AM   Specimen: Nasopharyngeal Swab  Result Value Ref Range   SARS Coronavirus 2 NEGATIVE NEGATIVE    Comment: (NOTE) If result is NEGATIVE SARS-CoV-2 target nucleic acids are NOT DETECTED. The SARS-CoV-2 RNA is generally detectable in upper and lower  respiratory specimens during the acute phase of infection. The lowest  concentration of SARS-CoV-2 viral copies this assay can detect is 250  copies / mL. A negative result does not preclude  SARS-CoV-2 infection  and should not be used as the sole basis for treatment or other  patient management decisions.  A negative result may occur with  improper specimen collection / handling, submission of specimen other  than nasopharyngeal swab, presence of  viral mutation(s) within the  areas targeted by this assay, and inadequate number of viral copies  (<250 copies / mL). A negative result must be combined with clinical  observations, patient history, and epidemiological information. If result is POSITIVE SARS-CoV-2 target nucleic acids are DETECTED. The SARS-CoV-2 RNA is generally detectable in upper and lower  respiratory specimens dur ing the acute phase of infection.  Positive  results are indicative of active infection with SARS-CoV-2.  Clinical  correlation with patient history and other diagnostic information is  necessary to determine patient infection status.  Positive results do  not rule out bacterial infection or co-infection with other viruses. If result is PRESUMPTIVE POSTIVE SARS-CoV-2 nucleic acids MAY BE PRESENT.   A presumptive positive result was obtained on the submitted specimen  and confirmed on repeat testing.  While 2019 novel coronavirus  (SARS-CoV-2) nucleic acids may be present in the submitted sample  additional confirmatory testing may be necessary for epidemiological  and / or clinical management purposes  to differentiate between  SARS-CoV-2 and other Sarbecovirus currently known to infect humans.  If clinically indicated additional testing with an alternate test  methodology [phone]) is advised. The SARS-CoV-2 RNA is generally  detectable in upper and lower respiratory sp ecimens during the acute  phase of infection. The expected result is Negative. Fact Sheet for Patients:  StrictlyIdeas.no Fact Sheet for Healthcare Providers: BankingDealers.co.za This test is not yet approved or cleared by the  [place] FDA and has been authorized for detection and/or diagnosis of SARS-CoV-2 by FDA under an Emergency Use Authorization (EUA).  This EUA will remain in effect (meaning this test can be used) for the duration of the COVID-19 declaration under Section 564(b)(1) of the Act, 21 U.S.C. section 360bbb-3(b)(1), unless the authorization is terminated or revoked sooner. Performed at [facility], [place] [address], [place], [place] [address]   Urinalysis, Routine w reflex microscopic     Status: Abnormal   Collection Time: 03/19/19  4:00 AM  Result Value Ref Range   Color, Urine AMBER (A) YELLOW    Comment: BIOCHEMICALS MAY BE AFFECTED BY COLOR   APPearance HAZY (A) CLEAR   Specific Gravity, Urine 1.029 1.005 - 1.030   pH 5.0 5.0 - 8.0   Glucose, UA NEGATIVE NEGATIVE mg/dL   Hgb urine dipstick NEGATIVE NEGATIVE   Bilirubin Urine SMALL (A) NEGATIVE   Ketones, ur NEGATIVE NEGATIVE mg/dL   Protein, ur 100 (A) NEGATIVE mg/dL   Nitrite NEGATIVE NEGATIVE   Leukocytes,Ua NEGATIVE NEGATIVE   RBC / HPF 0-5 0 - 5 RBC/hpf   WBC, UA 0-5 0 - 5 WBC/hpf   Bacteria, UA NONE SEEN NONE SEEN   Squamous Epithelial / LPF 0-5 0 - 5   Mucus PRESENT     Comment: Performed at [facility], [place] [address] [place], [place] [address]  Lactic acid, plasma     Status: Abnormal   Collection Time: 03/19/19  4:27 AM  Result Value Ref Range   Lactic Acid, Venous 2.0 (HH) 0.5 - 1.9 mmol/L    Comment: CRITICAL RESULT CALLED TO, READ BACK BY AND VERIFIED WITH: JACKSON AT 0451 ON 03/19/2019 BY JPM Performed at [facility], [place] [address], [place], [place] [address]   CBC     Status: Abnormal   Collection Time: 03/19/19  5:35 AM  Result Value Ref Range   WBC 11.0 (H) 4.0 - 10.5 K/uL   RBC 5.19 4.22 - 5.81 MIL/uL  Hemoglobin 10.5 (L) 13.0 - 17.0 g/dL   HCT 38.1 (L) 39.0 - 52.0 %   MCV 73.4 (L) 80.0 - 100.0 fL   MCH 20.2 (L) 26.0 - 34.0 pg   MCHC 27.6  (L) 30.0 - 36.0 g/dL   RDW 21.5 (H) 11.5 - 15.5 %   Platelets 263 150 - 400 K/uL   nRBC 0.4 (H) 0.0 - 0.2 %    Comment: Performed at [facility], [place] [address], [place], [place] [address]   Dg Chest Port 1 View  Result Date: 03/19/2019 CLINICAL DATA:  Fever EXAM: PORTABLE CHEST 1 VIEW COMPARISON:  06/10/2017 FINDINGS: Prior CABG. Heart and mediastinal contours are within normal limits. No focal opacities or effusions. No acute bony abnormality. IMPRESSION: No active disease. Electronically Signed   By: Rolm Baptise M.D.   On: 03/19/2019 02:54    Pending Labs Unresulted Labs (From admission, onward)    Start     Ordered   03/19/19 0534  Procalcitonin  ONCE - STAT,   STAT     03/19/19 0533   03/19/19 0534  Protime-INR  ONCE - STAT,   STAT     03/19/19 0533   03/19/19 0534  APTT  ONCE - STAT,   STAT     03/19/19 0533   03/19/19 0534  Magnesium  Add-on,   AD     03/19/19 0533   03/19/19 0534  Phosphorus  Add-on,   AD     03/19/19 0533   03/19/19 5003  Basic metabolic panel  Tomorrow morning,   STAT     03/19/19 0533   03/19/19 0534  Urine culture  Once,   STAT    Question:  Patient immune status  Answer:  Normal   03/19/19 0533   03/19/19 0155  Blood Culture (routine x 2)  BLOOD CULTURE X 2,   STAT     03/19/19 0156   Signed and Held  Culture, blood (x 2)  BLOOD CULTURE X 2,   STAT    Comments: INITIATE ANTIBIOTICS WITHIN 1 HOUR AFTER BLOOD CULTURES DRAWN.  If unable to obtain blood cultures, call MD immediately regarding antibiotic instructions.   Question:  Patient immune status  Answer:  Normal   Signed and Held   Signed and Held  CBC with Differential  ONCE - STAT,   R     Signed and Held   Signed and Held  Creatinine, serum  (enoxaparin (LOVENOX)    CrCl >/= 30 ml/min)  Weekly,   R    Comments: while on enoxaparin therapy    Signed and Held   Signed and Held  CBC  Tomorrow morning,   R     Signed and Held   Signed and Held  Expectorated sputum  assessment w rflx to resp cult  Once,   R    Question:  Patient immune status  Answer:  Normal   Signed and Held          Vitals/Pain Today's Vitals   03/19/19 0515 03/19/19 0518 03/19/19 0530 03/19/19 0545  BP: (!) 123/92  (!) 156/84 (!) 146/94  Pulse: (!) 107  (!) 101 (!) 102  Resp: (!) 27  (!) 30 (!) 23  Temp:  (!) 100.7 F (38.2 C)    TempSrc:  Rectal    SpO2: 97%  97% 99%    Isolation Precautions Droplet and Contact precautions  Medications Medications  vancomycin (VANCOCIN) 1,500 mg in sodium chloride 0.9 %  500 mL IVPB (1,500 mg Intravenous New Bag/Given 03/19/19 0428)  ceFEPIme (MAXIPIME) 2 g in sodium chloride 0.9 % 100 mL IVPB (has no administration in time range)  vancomycin (VANCOCIN) 1,250 mg in sodium chloride 0.9 % 250 mL IVPB (has no administration in time range)  sodium chloride 0.9 % bolus 1,000 mL (0 mLs Intravenous Stopped 03/19/19 0400)  piperacillin-tazobactam (ZOSYN) IVPB 3.375 g (0 g Intravenous Stopped 03/19/19 0415)  acetaminophen (TYLENOL) suppository 650 mg (650 mg Rectal Given 03/19/19 0400)  sodium chloride 0.45 % bolus 1,000 mL (1,000 mLs Intravenous New Bag/Given 03/19/19 0600)    Mobility non-ambulatory

## 2019-03-19 NOTE — ED Notes (Signed)
Date and time results received: 03/19/19 0251 (use smartphrase ".now" to insert current time)  Test: Sodium Critical Value: 162  Name of Provider Notified: Delo  Orders Received? Or Actions Taken?: Orders Received - See Orders for details

## 2019-03-19 NOTE — Progress Notes (Signed)
Pharmacy Antibiotic Note  Justin Griffin is a 78 y.o. male admitted on 03/19/2019 with sepsis.  Pharmacy has been consulted for cefepime and vancomycin dosing.  Plan: Cefepime 2 Gm IV q12h Vancomycin 1500 mg x1 then 1250 mg IV q24h for est AUC = 457 Used old wt from Epic, 85 kg and estimated ht of 71 in Goal AUC =400-550 F/u scr/cultures/levels     Temp (24hrs), Avg:102.8 F (39.3 C), Min:101.8 F (38.8 C), Max:103.7 F (39.8 C)  Recent Labs  Lab 03/19/19 0155  WBC 9.8  CREATININE 1.36*  LATICACIDVEN 2.8*    CrCl cannot be calculated (Unknown ideal weight.).    Allergies  Allergen Reactions  . Zocor [Simvastatin] Other (See Comments)    Muscle pain    Antimicrobials this admission: 6/14 zosyn >> x1 ED 6/14 vancomycin >>  6/14 cefepime >>  Dose adjustments this admission:   Microbiology results:  BCx:   UCx:    Sputum:    MRSA PCR:   Thank you for allowing pharmacy to be a part of this patient's care.  Dorrene German 03/19/2019 3:58 AM

## 2019-03-20 ENCOUNTER — Encounter (HOSPITAL_COMMUNITY): Payer: Self-pay

## 2019-03-20 ENCOUNTER — Inpatient Hospital Stay (HOSPITAL_COMMUNITY): Payer: Medicare (Managed Care)

## 2019-03-20 DIAGNOSIS — L8990 Pressure ulcer of unspecified site, unspecified stage: Secondary | ICD-10-CM | POA: Insufficient documentation

## 2019-03-20 DIAGNOSIS — I2510 Atherosclerotic heart disease of native coronary artery without angina pectoris: Secondary | ICD-10-CM

## 2019-03-20 DIAGNOSIS — R140 Abdominal distension (gaseous): Secondary | ICD-10-CM

## 2019-03-20 LAB — BASIC METABOLIC PANEL
Anion gap: 8 (ref 5–15)
BUN: 31 mg/dL — ABNORMAL HIGH (ref 8–23)
CO2: 22 mmol/L (ref 22–32)
Calcium: 8.4 mg/dL — ABNORMAL LOW (ref 8.9–10.3)
Chloride: 123 mmol/L — ABNORMAL HIGH (ref 98–111)
Creatinine, Ser: 1.05 mg/dL (ref 0.61–1.24)
GFR calc Af Amer: >60 mL/min (ref >60)
GFR calc non Af Amer: >60 mL/min (ref >60)
Glucose, Bld: 148 mg/dL — ABNORMAL HIGH (ref 70–99)
Potassium: 4.1 mmol/L (ref 3.5–5.1)
Sodium: 153 mmol/L — ABNORMAL HIGH (ref 135–145)

## 2019-03-20 LAB — COMPREHENSIVE METABOLIC PANEL
ALT: 52 U/L — ABNORMAL HIGH (ref 0–44)
AST: 48 U/L — ABNORMAL HIGH (ref 15–41)
Albumin: 2.5 g/dL — ABNORMAL LOW (ref 3.5–5.0)
Alkaline Phosphatase: 49 U/L (ref 38–126)
Anion gap: 10 (ref 5–15)
BUN: 36 mg/dL — ABNORMAL HIGH (ref 8–23)
CO2: 22 mmol/L (ref 22–32)
Calcium: 8.3 mg/dL — ABNORMAL LOW (ref 8.9–10.3)
Chloride: 124 mmol/L — ABNORMAL HIGH (ref 98–111)
Creatinine, Ser: 1.17 mg/dL (ref 0.61–1.24)
GFR calc Af Amer: >60 mL/min (ref >60)
GFR calc non Af Amer: 60 mL/min — ABNORMAL LOW (ref >60)
Glucose, Bld: 163 mg/dL — ABNORMAL HIGH (ref 70–99)
Potassium: 3.4 mmol/L — ABNORMAL LOW (ref 3.5–5.1)
Sodium: 156 mmol/L — ABNORMAL HIGH (ref 135–145)
Total Bilirubin: 0.5 mg/dL (ref 0.3–1.2)
Total Protein: 6.7 g/dL (ref 6.5–8.1)

## 2019-03-20 LAB — GLUCOSE, CAPILLARY: Glucose-Capillary: 132 mg/dL — ABNORMAL HIGH (ref 70–99)

## 2019-03-20 LAB — CBC
HCT: 34.1 % — ABNORMAL LOW (ref 39.0–52.0)
Hemoglobin: 9 g/dL — ABNORMAL LOW (ref 13.0–17.0)
MCH: 19.7 pg — ABNORMAL LOW (ref 26.0–34.0)
MCHC: 26.4 g/dL — ABNORMAL LOW (ref 30.0–36.0)
MCV: 74.6 fL — ABNORMAL LOW (ref 80.0–100.0)
Platelets: 216 10*3/uL (ref 150–400)
RBC: 4.57 MIL/uL (ref 4.22–5.81)
RDW: 20.9 % — ABNORMAL HIGH (ref 11.5–15.5)
WBC: 9.6 10*3/uL (ref 4.0–10.5)
nRBC: 0.4 % — ABNORMAL HIGH (ref 0.0–0.2)

## 2019-03-20 LAB — URINE CULTURE
Culture: NO GROWTH
Special Requests: NORMAL

## 2019-03-20 LAB — MAGNESIUM: Magnesium: 2.4 mg/dL (ref 1.7–2.4)

## 2019-03-20 LAB — LACTIC ACID, PLASMA: Lactic Acid, Venous: 2 mmol/L (ref 0.5–1.9)

## 2019-03-20 LAB — HEPATITIS PANEL, ACUTE
HCV Ab: <0.1 s/co ratio (ref 0.0–0.9)
Hep A IgM: NEGATIVE
Hep B C IgM: NEGATIVE
Hepatitis B Surface Ag: NEGATIVE

## 2019-03-20 LAB — AMMONIA: Ammonia: 30 umol/L (ref 9–35)

## 2019-03-20 MED ORDER — BISACODYL 10 MG RE SUPP
10.0000 mg | Freq: Once | RECTAL | Status: AC
  Administered 2019-03-20: 10 mg via RECTAL
  Filled 2019-03-20: qty 1

## 2019-03-20 MED ORDER — BISACODYL 10 MG RE SUPP
10.0000 mg | Freq: Every day | RECTAL | Status: DC
  Administered 2019-03-21 – 2019-03-24 (×4): 10 mg via RECTAL
  Filled 2019-03-20 (×4): qty 1

## 2019-03-20 MED ORDER — POTASSIUM CHLORIDE 10 MEQ/100ML IV SOLN
10.0000 meq | INTRAVENOUS | Status: AC
  Administered 2019-03-20 (×2): 10 meq via INTRAVENOUS
  Filled 2019-03-20: qty 100

## 2019-03-20 MED ORDER — POTASSIUM CHLORIDE 10 MEQ/100ML IV SOLN
10.0000 meq | INTRAVENOUS | Status: DC
  Administered 2019-03-20 (×2): 10 meq via INTRAVENOUS
  Filled 2019-03-20 (×3): qty 100

## 2019-03-20 NOTE — Progress Notes (Signed)
CRITICAL VALUE ALERT  Critical Value:  Lactic Acid 2.0  Date & Time Notied:  03/20/19 0348  Provider Notified: Kennon Holter  Orders Received/Actions taken: Awaiting orders

## 2019-03-20 NOTE — Progress Notes (Addendum)
PROGRESS NOTE    Justin Griffin  RAQ:[id number] DOB: [birth year] DOA: 03/19/2019 PCP: [facility]   Brief Narrative:  HPI per Dr. Rosamaria Lints note the entire history is obtained from the patient's emergency department chart, emergency department provider. Patient's personal history is limited by dementia and altered mental status.  Justin Griffin is a 78 y.o. male with a known history of dementia, CAD, RA, HLD presents to the emergency department for evaluation of Fever.  Patient was in a usual state of health at his nursing home but was sent to ER for evaluation of fever.  No known focal symptoms. .   Otherwise there has been no change in status. Patient has been taking medication as prescribed and there has been no recent change in medication or diet.  No recent antibiotics.  There has been no recent illness, hospitalizations, travel or sick contacts.    EMS/ED Course: Patient received Zosyn/Vanco. Medical admission has been requested for further management of Sepsis, unclear source, hypovolemic hypernatremia.  Assessment & Plan:   Principal Problem:   Sepsis ([place]) Active Problems:   Acute hypernatremia   Essential hypertension, benign   Coronary artery disease   GERD   CORONARY ARTERY BYPASS GRAFT, HX OF   Acute kidney injury ([place])   Dehydration   Hypokalemia   Anemia  1 sepsis Questionable etiology.  Patient met criteria for sepsis on admission with fevers, leukocytosis, elevated lactic acid level of 2.8.  Urinalysis nitrite negative, leukocytes negative.  Chest x-ray negative.  Blood cultures ordered and pending.  SARS COVID negative.  Patient currently afebrile.  Lactic acid level at 2.0.  Continue IV fluids.  Check abdominal films as patient with some abdominal distention.  Continue empiric IV vancomycin, IV cefepime, IV Flagyl, IV fluids, supportive care.  Trend lactic acid level.  Follow.  2.  Hypovolemic hypernatremia Likely secondary  to significant volume depletion/dehydration.  Patient currently on D5W.  Sodium level at 156 from change 162 on admission.  Increase D5W to 100 cc/h.  Repeat labs this afternoon.  Follow.  3.  Acute kidney injury Likely secondary to a prerenal azotemia.  Urinalysis is nitrite negative, leukocytes negative, glucose negative, 100 protein.  Renal function improving with hydration.  Continue IV fluids.  Strict I's and O's.  Daily weights.  Follow.  4.  Dehydration IV fluids.  5.  Hypokalemia Replete.  6.  Anemia of chronic disease Likely anemia of chronic disease.  Patient with no overt bleeding.  Hemoglobin at 9.0.  Follow.   7.  Gastroesophageal reflux disease Continue IV PPI.  PPI.  8.  History of dementia Patient drowsy and not alert at this time.  Patient not taking his oral Celexa, mematadine, Depakote, Exelon.  9.  Hyperlipidemia Hold statin.  10.  Transaminitis Likely secondary to dehydration.  LFTs trending down.  Will check an acute hepatitis panel.  Continue to hold statin.  Follow.  11.  Hypertension Patient refusing oral intake.  Blood pressure stable currently at 133/77 this morning.  Oral antihypertensive medications have been discontinued.  Follow for now.   12.  Coronary artery disease Stable.  Patient somewhat drowsy and lethargic refusing to open mouth.  Blood pressure currently stable.  HCTZ has been discontinued as patient refusing oral intake.  Statin also discontinued due to transaminitis.  Will resume cardiac medications when more alert and tolerating oral intake.  Follow.   13.  Acute metabolic encephalopathy/ Likely multifactorial secondary to problems #1, 2 with  underlying history of dementia..  Patient moans to verbal stimuli and refuses to open his eyes open his mouth.  Patient currently afebrile.  Patient pancultured results pending.  Check an ammonia level.  Continue IV fluids.  Follow.  14.  Abdominal distention Check a KUB.  Dulcolax suppository x1.   15.  Urinary retention Patient status post I and O cath x2 yesterday.  Foley catheter was ordered.   DVT prophylaxis: Lovenox Code Status: DNR Family Communication: Updated daughter, [name] via telephone. Disposition Plan: Remain in stepdown unit.   Consultants:   None  Procedures:   Chest x-ray 03/19/2019  Antimicrobials:   IV cefepime 03/19/2019  IV Flagyl 03/19/2019  IV vancomycin 03/19/2019   Subjective: Patient resting comfortably.  Patient moans to verbal stimuli however refusing to open eyes open mouth.  Somewhat drowsy/lethargic.  Objective: Vitals:   03/20/19 0500 03/20/19 0600 03/20/19 0610 03/20/19 0800  BP:  128/73  133/77  Pulse: 92 81 87 83  Resp: 17 (!) '24 15 16  ' Temp:    98 F (36.7 C)  TempSrc:    Oral  SpO2: 97% 100% 94% 100%  Weight: 80.9 kg     Height:        Intake/Output Summary (Last 24 hours) at 03/20/2019 0923 Last data filed at 03/20/2019 0804 Gross per 24 hour  Intake 2668.2 ml  Output 750 ml  Net 1918.2 ml   Filed Weights   03/19/19 0733 03/20/19 0500  Weight: 75.8 kg 80.9 kg    Examination:  General exam: Lethargic.  Dry mucous membranes. Respiratory system: CTAB anterior lung fields. Respiratory effort normal. Cardiovascular system: S1 & S2 heard, RRR. No JVD, murmurs, rubs, gallops or clicks. No pedal edema. Gastrointestinal system: Abdomen is mildly distended, decreased bowel sounds, nontender to palpation, no rebound, no guarding.   Central nervous system: Moans with verbal stimuli..  Moving extremities spontaneously.. No focal neurological deficits. Extremities: Symmetric 5 x 5 power. Skin: No rashes, lesions or ulcers Psychiatry: Judgement and insight unable to be assessed as patient asleep.. Mood & affect appropriate.     Data Reviewed: I have personally reviewed following labs and imaging studies  CBC: Recent Labs  Lab 03/19/19 0155 03/19/19 0535 03/19/19 0645 03/20/19 0301  WBC 9.8 11.0* 10.5 9.6  NEUTROABS  6.9  --  7.2  --   HGB 11.5* 10.5* 10.2* 9.0*  HCT 41.6 38.1* 37.3* 34.1*  MCV 73.2* 73.4* 74.7* 74.6*  PLT 326 263 250 017   Basic Metabolic Panel: Recent Labs  Lab 03/19/19 0155 03/19/19 0535 03/19/19 1429 03/20/19 0301  NA 162* 160* 157* 156*  K 3.4* 3.3* 3.4* 3.4*  CL 122* 127* 123* 124*  CO2 '25 26 25 22  ' GLUCOSE 171* 159* 167* 163*  BUN 37* 36* 33* 36*  CREATININE 1.36* 1.22 1.16 1.17  CALCIUM 9.0 8.5* 8.1* 8.3*  MG  --  2.5*  --  2.4  PHOS  --  4.1  --   --    GFR: Estimated Creatinine Clearance: 58 mL/min (by C-G formula based on SCr of 1.17 mg/dL). Liver Function Tests: Recent Labs  Lab 03/19/19 0155 03/20/19 0301  AST 99* 48*  ALT 80* 52*  ALKPHOS 65 49  BILITOT 1.0 0.5  PROT 8.5* 6.7  ALBUMIN 3.0* 2.5*   No results for input(s): LIPASE, AMYLASE in the last 168 hours. No results for input(s): AMMONIA in the last 168 hours. Coagulation Profile: Recent Labs  Lab 03/19/19 0535  INR 1.3*  Cardiac Enzymes: No results for input(s): CKTOTAL, CKMB, CKMBINDEX, TROPONINI in the last 168 hours. BNP (last 3 results) No results for input(s): PROBNP in the last 8760 hours. HbA1C: No results for input(s): HGBA1C in the last 72 hours. CBG: No results for input(s): GLUCAP in the last 168 hours. Lipid Profile: No results for input(s): CHOL, HDL, LDLCALC, TRIG, CHOLHDL, LDLDIRECT in the last 72 hours. Thyroid Function Tests: No results for input(s): TSH, T4TOTAL, FREET4, T3FREE, THYROIDAB in the last 72 hours. Anemia Panel: Recent Labs    03/19/19 1033  VITAMINB12 645  FOLATE 22.0  FERRITIN 205  TIBC 220*  IRON 35*   Sepsis Labs: Recent Labs  Lab 03/19/19 0155 03/19/19 0427 03/19/19 0535 03/20/19 0301  PROCALCITON  --   --  0.18  --   LATICACIDVEN 2.8* 2.0*  --  2.0*    Recent Results (from the past 240 hour(s))  Blood Culture (routine x 2)     Status: None (Preliminary result)   Collection Time: 03/19/19  1:55 AM   Specimen: BLOOD  Result  Value Ref Range Status   Specimen Description   Final    BLOOD RIGHT ANTECUBITAL Performed at [facility], [place] [address] [place], [place] [address]    Special Requests   Final    BOTTLES DRAWN AEROBIC AND ANAEROBIC Blood Culture adequate volume Performed at [place] [address] [place], [place] [address]    Culture   Final    NO GROWTH < 24 HOURS Performed at [facility] [address], [place], [place] [address]    Report Status PENDING  Incomplete  SARS Coronavirus 2 (CEPHEID- Performed in [facility] hospital lab), Hosp Order     Status: None   Collection Time: 03/19/19  2:05 AM   Specimen: Nasopharyngeal Swab  Result Value Ref Range Status   SARS Coronavirus 2 NEGATIVE NEGATIVE Final    Comment: (NOTE) If result is NEGATIVE SARS-CoV-2 target nucleic acids are NOT DETECTED. The SARS-CoV-2 RNA is generally detectable in upper and lower  respiratory specimens during the acute phase of infection. The lowest  concentration of SARS-CoV-2 viral copies this assay can detect is 250  copies / mL. A negative result does not preclude SARS-CoV-2 infection  and should not be used as the sole basis for treatment or other  patient management decisions.  A negative result may occur with  improper specimen collection / handling, submission of specimen other  than nasopharyngeal swab, presence of viral mutation(s) within the  areas targeted by this assay, and inadequate number of viral copies  (<250 copies / mL). A negative result must be combined with clinical  observations, patient history, and epidemiological information. If result is POSITIVE SARS-CoV-2 target nucleic acids are DETECTED. The SARS-CoV-2 RNA is generally detectable in upper and lower  respiratory specimens dur ing the acute phase of infection.  Positive  results are indicative of active infection with SARS-CoV-2.  Clinical  correlation with patient history and other  diagnostic information is  necessary to determine patient infection status.  Positive results do  not rule out bacterial infection or co-infection with other viruses. If result is PRESUMPTIVE POSTIVE SARS-CoV-2 nucleic acids MAY BE PRESENT.   A presumptive positive result was obtained on the submitted specimen  and confirmed on repeat testing.  While 2019 novel coronavirus  (SARS-CoV-2) nucleic acids may be present in the submitted sample  additional confirmatory testing may be necessary for epidemiological  and /  or clinical management purposes  to differentiate between  SARS-CoV-2 and other Sarbecovirus currently known to infect humans.  If clinically indicated additional testing with an alternate test  methodology [phone]) is advised. The SARS-CoV-2 RNA is generally  detectable in upper and lower respiratory sp ecimens during the acute  phase of infection. The expected result is Negative. Fact Sheet for Patients:  StrictlyIdeas.no Fact Sheet for Healthcare Providers: BankingDealers.co.za This test is not yet approved or cleared by the [place] FDA and has been authorized for detection and/or diagnosis of SARS-CoV-2 by FDA under an Emergency Use Authorization (EUA).  This EUA will remain in effect (meaning this test can be used) for the duration of the COVID-19 declaration under Section 564(b)(1) of the Act, 21 U.S.C. section 360bbb-3(b)(1), unless the authorization is terminated or revoked sooner. Performed at [facility], [place] [address] [place], [place] [address]   Urine culture     Status: None   Collection Time: 03/19/19  4:00 AM   Specimen: Urine, Clean Catch  Result Value Ref Range Status   Specimen Description   Final    URINE, CLEAN CATCH Performed at [facility], [place] [address] [place], [place] [address]    Special Requests   Final    Normal Performed at [facility], [place] [address] [place], [place] [address]    Culture   Final    NO GROWTH Performed at [facility], [place] [address] [place], [place] [address]    Report Status 03/20/2019 FINAL  Final  MRSA PCR Screening     Status: None   Collection Time: 03/19/19  6:45 AM   Specimen: Nasal Mucosa; Nasopharyngeal  Result Value Ref Range Status   MRSA by PCR NEGATIVE NEGATIVE Final    Comment:        The GeneXpert MRSA Assay (FDA approved for NASAL specimens only), is one component of a comprehensive MRSA colonization surveillance program. It is not intended to diagnose MRSA infection nor to guide or monitor treatment for MRSA infections. Performed at [facility], [place] [address] [place], [place] [address]   Blood Culture (routine x 2)     Status: None (Preliminary result)   Collection Time: 03/19/19  6:50 AM   Specimen: BLOOD  Result Value Ref Range Status   Specimen Description   Final    BLOOD RIGHT ANTECUBITAL Performed at [facility] [address], [place], [place] [address]    Special Requests   Final    BOTTLES DRAWN AEROBIC ONLY Blood Culture adequate volume Performed at [facility] [address], [place], [place] [address]    Culture   Final    NO GROWTH < 12 HOURS Performed at [facility] [address], [place], [place] [address]    Report Status PENDING  Incomplete         Radiology Studies: Dg Chest Port 1 View  Result Date: 03/19/2019 CLINICAL DATA:  Fever.  Sepsis.  Coronary artery disease. EXAM: PORTABLE CHEST 1 VIEW COMPARISON:  03/19/2019 FINDINGS: The heart size and mediastinal contours are within normal limits. Prior CABG again noted. Both lungs are clear. The visualized skeletal structures are unremarkable. IMPRESSION: No active disease. Electronically Signed   By: Earle Gell M.D.   On: 03/19/2019 07:40   Dg Chest Port 1 View  Result Date: 03/19/2019 CLINICAL  DATA:  Fever EXAM: PORTABLE CHEST 1 VIEW COMPARISON:  06/10/2017 FINDINGS:  Prior CABG. Heart and mediastinal contours are within normal limits. No focal opacities or effusions. No acute bony abnormality. IMPRESSION: No active disease. Electronically Signed   By: Rolm Baptise M.D.   On: 03/19/2019 02:54        Scheduled Meds: . aspirin EC  81 mg Oral Daily  . bisacodyl  10 mg Rectal Once  . Chlorhexidine Gluconate Cloth  6 each Topical Daily  . enoxaparin (LOVENOX) injection  40 mg Subcutaneous Q24H  . latanoprost  1 drop Both Eyes QHS  . mouth rinse  15 mL Mouth Rinse BID  . pantoprazole (PROTONIX) IV  40 mg Intravenous Daily  . risperiDONE  0.125 mg Oral BID  . rivastigmine  4.5 mg Oral BID  . senna-docusate  2 tablet Oral Daily  . valproic acid  250 mg Oral QID   Continuous Infusions: . sodium chloride Stopped (03/19/19 1212)  . ceFEPime (MAXIPIME) IV Stopped (03/19/19 2224)  . dextrose 100 mL/hr at 03/20/19 0804  . metronidazole 500 mg (03/20/19 0804)  . potassium chloride    . vancomycin Stopped (03/20/19 0752)     LOS: 1 day    Time spent: 40 minutes    Irine Seal, MD [facility]  If 7PM-7AM, please contact night-coverage [email or web address] 03/20/2019, 9:23 AM

## 2019-03-20 NOTE — TOC Initial Note (Signed)
Transition of Care [facility]) - Initial/Assessment Note    Patient Details  Name: Justin Griffin MRN: [id number] Date of Birth: 1941/01/13  [facility] CM/SW Contact:    Joaquin Courts, RN Phone Number: 03/20/2019, 1:26 PM  Clinical Narrative:   CM received call from Lelon Frohlich, social worker with [facility].  Lelon Frohlich is following patient and spoke with his family who express that they do not wish for patient to return to [facility] at discharge.  [facility] works with two other facilities, [facility] and [facility].  [facility] has told Lelon Frohlich that they will not be able to offer pt a bed at their facility due to patient coming from a SNF with Covid outbreak despite the patient testing negative.  Ann reached out to [facility] and is awaiting their response.  Should patient receive bed offers from other facilities, he caould elect to go there however would need to unenroll from [facility] in order to do that. Lelon Frohlich will communicate this to his family and update CM as she gets more information.                 Expected Discharge Plan: [place] Barriers to Discharge: Continued Medical Work up   Patient Goals and CMS Choice        Expected Discharge Plan and Services Expected Discharge Plan: [place]   Discharge Planning Services: CM Consult   Living arrangements for the past 2 months: [place] Expected Discharge Date: (unknown)               DME Arranged: N/A DME Agency: NA       HH Arranged: NA [place] Agency: NA        Prior Living Arrangements/Services Living arrangements for the past 2 months: [place] Lives with:: Other (Comment)(lives at SNF) Patient language and need for interpreter reviewed:: Yes Do you feel safe going back to the place where you live?: Yes      Need for Family Participation in Patient Care: Yes (Comment) Care giver support system in place?: Yes (comment)   Criminal Activity/Legal Involvement  Pertinent to Current Situation/Hospitalization: No - Comment as needed  Activities of Daily Living Home Assistive Devices/Equipment: Blood pressure cuff, Grab bars around toilet, Grab bars in shower, Hand-held shower hose, Hospital bed, [employer], Scales, Nebulizer, Oxygen, Other (Comment)([facility] has necessary equipment for their residents) ADL Screening (condition at time of admission) Patient's cognitive ability adequate to safely complete daily activities?: No Is the patient deaf or have difficulty hearing?: No Does the patient have difficulty seeing, even when wearing glasses/contacts?: Yes Does the patient have difficulty concentrating, remembering, or making decisions?: Yes Patient able to express need for assistance with ADLs?: No Does the patient have difficulty dressing or bathing?: Yes Independently performs ADLs?: No Communication: Needs assistance Is this a change from baseline?: Change from baseline, expected to last >3 days Dressing (OT): Dependent Is this a change from baseline?: Change from baseline, expected to last >3 days Grooming: Dependent Is this a change from baseline?: Change from baseline, expected to last >3 days Feeding: Dependent Is this a change from baseline?: Change from baseline, expected to last >3 days Bathing: Dependent Is this a change from baseline?: Change from baseline, expected to last >3 days Toileting: Dependent Is this a change from baseline?: Change from baseline, expected to last >3days In/Out Bed: Dependent Is this a change from baseline?: Change from baseline, expected to last >3 days  Walks in Home: Dependent Is this a change from baseline?: Change from baseline, expected to last >3 days Does the patient have difficulty walking or climbing stairs?: Yes Weakness of Legs: Both Weakness of Arms/Hands: Both  Permission Sought/Granted                  Emotional Assessment Appearance:: Appears stated age             Admission diagnosis:  Sepsis, due to unspecified organism, unspecified whether acute organ dysfunction present ([place]) [A41.9] Sepsis ([place]) [A41.9] Patient Active Problem List   Diagnosis Date Noted  . Abdominal distention   . Sepsis ([place]) 03/19/2019  . Acute hypernatremia 03/19/2019  . Hypokalemia 03/19/2019  . Anemia 03/19/2019  . Dehydration   . Delirium 06/11/2017  . Acute kidney injury ([place]) 06/11/2017  . Fever 06/11/2017  . Effusion of right knee 06/11/2017  . Inguinal hernia 12/24/2016  . Testicular pain, left 08/07/2016  . Hemorrhoid 08/07/2016  . 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[place]) 07/24/2016  . Routine general medical examination at a health care facility 01/27/2016  . Medicare annual wellness visit, subsequent 01/27/2016  . Essential hypertension, benign 08/08/2009  . GERD 07/04/2008  . RHEUMATOID ARTHRITIS 07/04/2008  . WEIGHT LOSS-ABNORMAL 07/04/2008  . DYSPHAGIA 07/04/2008  . TOBACCO USER 07/03/2008  . POLYP, COLON 02/04/2008  . DYSLIPIDEMIA 02/04/2008  . Essential hypertension 02/04/2008  . Coronary artery disease 02/04/2008  . POLYPECTOMY, HX OF 02/04/2008  . CORONARY ARTERY BYPASS GRAFT, HX OF 02/04/2008   PCP:  [facility] [place]:  No Pharmacies Listed    Social Determinants of Health (SDOH) Interventions    Readmission Risk Interventions Readmission Risk Prevention Plan 03/20/2019  Transportation Screening Complete  PCP or Specialist Appt within 3-5 Days Complete  HRI or [place] Complete  Social Work Consult for [place] Planning/Counseling Complete  Palliative Care Screening Not Applicable  Medication Review Press photographer) Complete  Some recent data might be hidden

## 2019-03-20 NOTE — Evaluation (Signed)
Physical Therapy One Time Evaluation Patient Details Name: Justin Griffin MRN: [id number] DOB: 1941/09/10 Today's Date: 03/20/2019   History of Present Illness  78 year old male with past medical history of dementia, coronary artery disease, rheumatoid, hypertension, Alzheimers and admitted from SNF for sepsis.  Clinical Impression  Patient evaluated by Physical Therapy with no further acute PT needs identified. All education has been completed and the patient has no further questions.  Pt with hx of dementia and nonverbal during session.  Pt not assisting and resistant to bed mobility at this time.  Pt is from SNF, so will defer any PT needs to SNF. See below for any follow-up Physical Therapy or equipment needs. PT is signing off. Thank you for this referral.     Follow Up Recommendations SNF(from SNF, PT per SNF)    Equipment Recommendations  None recommended by PT    Recommendations for Other Services       Precautions / Restrictions Precautions Precautions: Fall      Mobility  Bed Mobility Overal bed mobility: Needs Assistance Bed Mobility: Supine to Sit;Sit to Supine     Supine to sit: Total assist Sit to supine: Total assist   General bed mobility comments: attempted to assist pt with bed mobility however pt resistant and not assisting so returned to supine and repositioned pt  Transfers                    Ambulation/Gait                Stairs            Wheelchair Mobility    Modified Rankin (Stroke Patients Only)       Balance                                             Pertinent Vitals/Pain Pain Assessment: Faces Faces Pain Scale: Hurts even more Pain Location: grimacing with touch to LEs (socks donned and with bed mobility) Pain Descriptors / Indicators: Grimacing;Guarding Pain Intervention(s): Repositioned    Home Living Family/patient expects to be discharged to:: Skilled nursing facility                  Additional Comments: pt poor historian, per chart from SNF    Prior Function                 Hand Dominance        Extremity/Trunk Assessment   Upper Extremity Assessment Upper Extremity Assessment: Difficult to assess due to impaired cognition    Lower Extremity Assessment Lower Extremity Assessment: Difficult to assess due to impaired cognition       Communication   Communication: Other (comment)(pt with no verbalizations)  Cognition Arousal/Alertness: Awake/alert Behavior During Therapy: Agitated Overall Cognitive Status: No family/caregiver present to determine baseline cognitive functioning                                 General Comments: pt appeared a little agitated with movement however made no communication, no verbalizations, hx of dementia      General Comments      Exercises     Assessment/Plan    PT Assessment Patent does not need any further PT services  PT Problem List  PT Treatment Interventions      PT Goals (Current goals can be found in the Care Plan section)  Acute Rehab PT Goals PT Goal Formulation: All assessment and education complete, DC therapy    Frequency     Barriers to discharge        Co-evaluation               AM-PAC PT "6 Clicks" Mobility  Outcome Measure Help needed turning from your back to your side while in a flat bed without using bedrails?: Total Help needed moving from lying on your back to sitting on the side of a flat bed without using bedrails?: Total Help needed moving to and from a bed to a chair (including a wheelchair)?: Total Help needed standing up from a chair using your arms (e.g., wheelchair or bedside chair)?: Total Help needed to walk in hospital room?: Total Help needed climbing 3-5 steps with a railing? : Total 6 Click Score: 6    End of Session   Activity Tolerance: Treatment limited secondary to agitation;Other (comment)(limited by  cognition) Patient left: in bed;with call bell/phone within reach;with bed alarm set   PT Visit Diagnosis: Other abnormalities of gait and mobility (R26.89)    Time: 2233-6122 PT Time Calculation (min) (ACUTE ONLY): 12 min   Charges:   PT Evaluation $PT Eval Low Complexity: [place], PT, DPT Acute Rehabilitation Services Office: [phone] Pager: [phone]  Trena Platt 03/20/2019, 12:11 PM

## 2019-03-20 NOTE — Evaluation (Signed)
Clinical/Bedside Swallow Evaluation Patient Details  Name: Justin Griffin MRN: [id number] Date of Birth: 06/28/1941  Today's Date: 03/20/2019 Time: SLP Start Time (ACUTE ONLY): 1522 SLP Stop Time (ACUTE ONLY): 1551 SLP Time Calculation (min) (ACUTE ONLY): 29 min  Past Medical History:  Past Medical History:  Diagnosis Date  . Alzheimer's disease ([place])   . CAD (coronary artery disease)    Catheterization 2006 LAD occluded, OM 95% stenosis followed by mid occlusion, first obtuse marginal occluded, right coronary artery diffuse moderate disease. LIMA to the LAD was patent. Saphenous vein to PDA and posterior lateral patent with diffuse luminal irregularities, saphenous vein graft to second obtuse marginal is patent, saphenous vein graft to the first obtuse marginal had 25% stenosis.  . Esophageal reflux   . Esophageal stricture   . Other and unspecified hyperlipidemia   . Other dysphagia   . Rheumatoid arthritis(714.0)   . Status post dilation of esophageal narrowing   . Unspecified essential hypertension    Past Surgical History:  Past Surgical History:  Procedure Laterality Date  . CATARACT EXTRACTION, BILATERAL    . CORONARY ARTERY BYPASS GRAFT  1996  . INGUINAL HERNIA REPAIR Right 04/05/2017   Procedure: OPEN RIGHT INGUINAL HERNIA REPAIR;  Surgeon: Alphonsa Overall, MD;  Location: [facility];  Service: General;  Laterality: Right;  . INSERTION OF MESH Right 04/05/2017   Procedure: INSERTION OF MESH;  Surgeon: Alphonsa Overall, MD;  Location: [facility];  Service: General;  Laterality: Right;  . left digit amputation     HPI:  pt is a 78 yo male adm to [facility] with AMS, diagnosed with sepsis.  Pt has h/o severe esophageal dysmotility, dementia, CAD, RA, HLD.  Pt is on Cefepime, Vanco, Flagyl.  Swallow eval ordered.  CXR negative.  Abd imaging concerning for possible adynamic ileus.  Per SNF notes, pt on puree/thin diet prior to admit.  MOST form indicates no feeding tube, limited interventions.    Assessment / Plan / Recommendation Clinical Impression  Patient presents with cognitive related dysphagia c/b likely oral transiting delay and possible pharyngeal initiation delay. Initially pt with throat clearing and cough after oral care and with minimal intake. Suspect he mobilized secretions that were retained in pharynx as oral secretions present.  No focal CN deficits present from observation but pt did not follow directions.    After intake of approximately 3 boluses, no indication of airway compromise observed with minimal intake provided.  He does present with delayed swallow and recommend oral suction be completed if pt does not elicit swallow.  Suspect his dysphagia is due to his dementia but also has known h/o esophageal dysmotility. Pt with possible adynamic ileus limited po offered. Will follow up for dysphagia management.  Thanks for this consult. SLP Visit Diagnosis: Dysphagia, oral phase (R13.11);Dysphagia, unspecified (R13.10)(known esophageal)    Aspiration Risk  Moderate aspiration risk;Risk for inadequate nutrition/hydration    Diet Recommendation Other (Comment)(clear liquids - if md agrees)   Liquid Administration via: Spoon;Cup;Straw Medication Administration: Other (Comment)(via suspension) Supervision: Full supervision/cueing for compensatory strategies Compensations: Slow rate;Small sips/bites;Other (Comment)    Other  Recommendations Oral Care Recommendations: Oral care QID Other Recommendations: Have oral suction available   Follow up Recommendations        Frequency and Duration min 1 x/week  1 week       Prognosis Prognosis for Safe Diet Advancement: Fair Barriers to Reach Goals: Cognitive deficits;Severity of deficits      Swallow Study   General Date  of Onset: 03/20/19 HPI: pt is a 78 yo male adm to [facility] with AMS, diagnosed with sepsis.  Pt has h/o severe esophageal dysmotility, dementia, CAD, RA, HLD.  Pt is on Cefepime, Vanco, Flagyl.  Swallow  eval ordered.  CXR negative.  Abd imaging concerning for possible adynamic ileus.  Per SNF notes, pt on puree/thin diet prior to admit.  MOST form indicates no feeding tube, limited interventions. Type of Study: Bedside Swallow Evaluation Previous Swallow Assessment: esophagram, severe esophageal dysmotility, h/o strictures with dilatations; last visit with Dr Henrene Pastor in 06/2018 - md was concerned for dementia related dysphagia Diet Prior to this Study: NPO Temperature Spikes Noted: No Respiratory Status: Room air History of Recent Intubation: No Behavior/Cognition: Alert;Doesn't follow directions Oral Cavity Assessment: Dry Oral Care Completed by SLP: Yes(oral suction provided, pt will not follow directions for oral motor exam) Oral Cavity - Dentition: Missing dentition Self-Feeding Abilities: Total assist Patient Positioning: Upright in bed Baseline Vocal Quality: Not observed Volitional Cough: Cognitively unable to elicit Volitional Swallow: Unable to elicit    Oral/Motor/Sensory Function Overall Oral Motor/Sensory Function: Generalized oral weakness(pt able to seal lips on suction, did not follow directions for oral motor exam)   Ice Chips Ice chips: Impaired Presentation: Spoon Oral Phase Functional Implications: Prolonged oral transit Pharyngeal Phase Impairments: Suspected delayed Swallow   Thin Liquid Thin Liquid: Impaired Presentation: Spoon;Straw;Cup Oral Phase Functional Implications: Other (comment);Prolonged oral transit Pharyngeal  Phase Impairments: Suspected delayed Swallow    Nectar Thick Nectar Thick Liquid: Not tested   Honey Thick Honey Thick Liquid: Not tested   Puree Puree: Impaired Presentation: Spoon Pharyngeal Phase Impairments: Suspected delayed Swallow Other Comments: jello:   Solid     Solid: Not tested      Justin Griffin 03/20/2019,4:22 PM  Luanna Salk, [place] [facility] SLP [place] Pager [phone] Office [phone]

## 2019-03-21 ENCOUNTER — Inpatient Hospital Stay (HOSPITAL_COMMUNITY): Payer: Medicare (Managed Care)

## 2019-03-21 ENCOUNTER — Other Ambulatory Visit: Payer: Self-pay

## 2019-03-21 DIAGNOSIS — F0390 Unspecified dementia without behavioral disturbance: Secondary | ICD-10-CM

## 2019-03-21 DIAGNOSIS — K560 Paralytic ileus: Secondary | ICD-10-CM | POA: Clinically undetermined

## 2019-03-21 DIAGNOSIS — Z515 Encounter for palliative care: Secondary | ICD-10-CM

## 2019-03-21 DIAGNOSIS — Z7189 Other specified counseling: Secondary | ICD-10-CM

## 2019-03-21 LAB — BASIC METABOLIC PANEL
Anion gap: 11 (ref 5–15)
Anion gap: 9 (ref 5–15)
BUN: 21 mg/dL (ref 8–23)
BUN: 18 mg/dL (ref 8–23)
CO2: 20 mmol/L — ABNORMAL LOW (ref 22–32)
CO2: 22 mmol/L (ref 22–32)
Calcium: 8.2 mg/dL — ABNORMAL LOW (ref 8.9–10.3)
Calcium: 8.3 mg/dL — ABNORMAL LOW (ref 8.9–10.3)
Chloride: 119 mmol/L — ABNORMAL HIGH (ref 98–111)
Chloride: 119 mmol/L — ABNORMAL HIGH (ref 98–111)
Creatinine, Ser: 0.84 mg/dL (ref 0.61–1.24)
Creatinine, Ser: 0.79 mg/dL (ref 0.61–1.24)
GFR calc Af Amer: >60 mL/min (ref >60)
GFR calc Af Amer: >60 mL/min (ref >60)
GFR calc non Af Amer: >60 mL/min (ref >60)
GFR calc non Af Amer: >60 mL/min (ref >60)
Glucose, Bld: 134 mg/dL — ABNORMAL HIGH (ref 70–99)
Glucose, Bld: 150 mg/dL — ABNORMAL HIGH (ref 70–99)
Potassium: 3.1 mmol/L — ABNORMAL LOW (ref 3.5–5.1)
Potassium: 3.5 mmol/L (ref 3.5–5.1)
Sodium: 150 mmol/L — ABNORMAL HIGH (ref 135–145)
Sodium: 150 mmol/L — ABNORMAL HIGH (ref 135–145)

## 2019-03-21 LAB — COMPREHENSIVE METABOLIC PANEL
ALT: 36 U/L (ref 0–44)
AST: 30 U/L (ref 15–41)
Albumin: 2.4 g/dL — ABNORMAL LOW (ref 3.5–5.0)
Alkaline Phosphatase: 44 U/L (ref 38–126)
Anion gap: 12 (ref 5–15)
BUN: 26 mg/dL — ABNORMAL HIGH (ref 8–23)
CO2: 19 mmol/L — ABNORMAL LOW (ref 22–32)
Calcium: 8.6 mg/dL — ABNORMAL LOW (ref 8.9–10.3)
Chloride: 122 mmol/L — ABNORMAL HIGH (ref 98–111)
Creatinine, Ser: 0.96 mg/dL (ref 0.61–1.24)
GFR calc Af Amer: >60 mL/min (ref >60)
GFR calc non Af Amer: >60 mL/min (ref >60)
Glucose, Bld: 142 mg/dL — ABNORMAL HIGH (ref 70–99)
Potassium: 3.6 mmol/L (ref 3.5–5.1)
Sodium: 153 mmol/L — ABNORMAL HIGH (ref 135–145)
Total Bilirubin: 0.5 mg/dL (ref 0.3–1.2)
Total Protein: 6.5 g/dL (ref 6.5–8.1)

## 2019-03-21 LAB — CBC WITH DIFFERENTIAL/PLATELET
Abs Immature Granulocytes: 0.04 10*3/uL (ref 0.00–0.07)
Basophils Absolute: 0 10*3/uL (ref 0.0–0.1)
Basophils Relative: 0 %
Eosinophils Absolute: 0.1 10*3/uL (ref 0.0–0.5)
Eosinophils Relative: 1 %
HCT: 32.4 % — ABNORMAL LOW (ref 39.0–52.0)
Hemoglobin: 8.4 g/dL — ABNORMAL LOW (ref 13.0–17.0)
Immature Granulocytes: 1 %
Lymphocytes Relative: 20 %
Lymphs Abs: 1.6 10*3/uL (ref 0.7–4.0)
MCH: 20.4 pg — ABNORMAL LOW (ref 26.0–34.0)
MCHC: 25.9 g/dL — ABNORMAL LOW (ref 30.0–36.0)
MCV: 78.8 fL — ABNORMAL LOW (ref 80.0–100.0)
Monocytes Absolute: 0.7 10*3/uL (ref 0.1–1.0)
Monocytes Relative: 9 %
Neutro Abs: 5.6 10*3/uL (ref 1.7–7.7)
Neutrophils Relative %: 69 %
Platelets: UNDETERMINED 10*3/uL (ref 150–400)
RBC: 4.11 MIL/uL — ABNORMAL LOW (ref 4.22–5.81)
RDW: 21.2 % — ABNORMAL HIGH (ref 11.5–15.5)
WBC: 8.2 10*3/uL (ref 4.0–10.5)
nRBC: 0.4 % — ABNORMAL HIGH (ref 0.0–0.2)

## 2019-03-21 LAB — HEPATITIS PANEL, ACUTE
HCV Ab: <0.1 s/co ratio (ref 0.0–0.9)
Hep A IgM: NEGATIVE
Hep B C IgM: NEGATIVE
Hepatitis B Surface Ag: NEGATIVE

## 2019-03-21 LAB — LACTIC ACID, PLASMA: Lactic Acid, Venous: 2.3 mmol/L (ref 0.5–1.9)

## 2019-03-21 MED ORDER — POTASSIUM CHLORIDE 10 MEQ/100ML IV SOLN
10.0000 meq | INTRAVENOUS | Status: AC
  Administered 2019-03-21 (×4): 10 meq via INTRAVENOUS
  Filled 2019-03-21 (×4): qty 100

## 2019-03-21 MED ORDER — BACITRACIN 500 UNIT/GM EX OINT
TOPICAL_OINTMENT | Freq: Two times a day (BID) | CUTANEOUS | Status: DC
  Administered 2019-03-21 – 2019-03-23 (×5): via TOPICAL
  Administered 2019-03-23: 1 via TOPICAL
  Administered 2019-03-24: 09:00:00 via TOPICAL
  Filled 2019-03-21: qty 14

## 2019-03-21 MED ORDER — POTASSIUM CHLORIDE 20 MEQ PO PACK
40.0000 meq | PACK | Freq: Once | ORAL | Status: AC
  Administered 2019-03-21: 40 meq via ORAL
  Filled 2019-03-21: qty 2

## 2019-03-21 MED ORDER — POTASSIUM CHLORIDE 10 MEQ/100ML IV SOLN: 10.0000 meq | INTRAVENOUS | Status: DC

## 2019-03-21 MED ORDER — SODIUM CHLORIDE 0.45 % IV BOLUS
500.0000 mL | Freq: Once | INTRAVENOUS | Status: AC
  Administered 2019-03-21: 500 mL via INTRAVENOUS

## 2019-03-21 NOTE — Progress Notes (Addendum)
PROGRESS NOTE    MATTHER LABELL  KDX:[id number] DOB: 03/08/1941 DOA: 03/19/2019 PCP: [facility]   Brief Narrative:  HPI per Dr. Rosamaria Lints note the entire history is obtained from the patient's emergency department chart, emergency department provider. Patient's personal history is limited by dementia and altered mental status.  Kayvon Mo is a 78 y.o. male with a known history of dementia, CAD, RA, HLD presents to the emergency department for evaluation of Fever.  Patient was in a usual state of health at his nursing home but was sent to ER for evaluation of fever.  No known focal symptoms. .   Otherwise there has been no change in status. Patient has been taking medication as prescribed and there has been no recent change in medication or diet.  No recent antibiotics.  There has been no recent illness, hospitalizations, travel or sick contacts.    EMS/ED Course: Patient received Zosyn/Vanco. Medical admission has been requested for further management of Sepsis, unclear source, hypovolemic hypernatremia.  Assessment & Plan:   Principal Problem:   Sepsis ([place]) Active Problems:   Acute hypernatremia   Essential hypertension, benign   Coronary artery disease   GERD   CORONARY ARTERY BYPASS GRAFT, HX OF   Acute kidney injury (HCC)   Dehydration   Hypokalemia   Anemia   Abdominal distention   Pressure injury of skin   Adynamic ileus ([place])  1 sepsis Questionable etiology.  Patient met criteria for sepsis on admission with fevers, leukocytosis, elevated lactic acid level of 2.8.  Urinalysis nitrite negative, leukocytes negative.  Chest x-ray negative.  Blood cultures ordered and pending.  SARS COVID negative.  Patient currently afebrile.  Lactic acid level at 2.0.  Continue IV fluids.  Abdominal films obtained on 03/20/2019 was consistent with adynamic ileus.  Continue IV cefepime and IV Flagyl.  Discontinue IV vancomycin.  Continue IV fluids  and supportive care.  If no source identified could likely treat empirically for total of 5 to 7 days.  Due to poor oral intake will maintain patient on IV antibiotics for now. Follow.  2.  Hypovolemic hypernatremia Likely secondary to significant volume depletion/dehydration.  Patient currently on D5W.  Sodium level at 153 from 156 from 162 on admission.  We will give a 500 cc bolus of half-normal saline x1.  Continue D5W at 100 cc/h.  Repeat labs this afternoon.  Follow.   3.  Acute kidney injury Likely secondary to a prerenal azotemia.  Urinalysis is nitrite negative, leukocytes negative, glucose negative, 100 protein.  Renal function improving with hydration.  Continue IV fluids.  Strict I's and O's.  Daily weights.  Follow.  4.  Dehydration IV fluids.  5.  Hypokalemia Potassium at 3.6.  Keep potassium greater than 4 as patient was noted to have an ileus.  KCl 10 mEq IV every 4 1 hour x 4.   6.  Anemia of chronic disease Likely anemia of chronic disease.  Patient with no overt bleeding.  Hemoglobin at 8.4.  Patient with no overt bleeding.  Likely dilutional.  Transfusion threshold hemoglobin less than 7.  Follow.   7.  Gastroesophageal reflux disease Continue IV PPI.   8.  History of dementia Patient opens eyes to noxious stimuli.  Patient nonverbal.  Continue to hold his oral Celexa, mematadine, Depakote, Exelon.  Per daughter patient seems to have been declining over the past few months and currently nonverbal however opens eyes to noxious stimuli.  Patient admitted  for fever.  Patient noted to be on a pured diet per daughter at facility.  Speech therapy following.  Consult with palliative care medicine for goals of care.  9.  Hyperlipidemia Hold statin.  10.  Transaminitis Likely secondary to dehydration.  LFTs trending down.  Hepatitis panel is negative.  Continue to hold statin.  Continue hydration.  Follow.   11.  Hypertension Patient refusing oral intake.  Blood pressure  stable currently at 142/79 this morning.  Oral antihypertensive medications have been discontinued.  Follow for now.   12.  Coronary artery disease Stable.  Patient somewhat drowsy and lethargic refusing to open mouth.  Blood pressure currently stable.  HCTZ has been discontinued as patient refusing oral intake.  Statin also discontinued due to transaminitis.  Will resume cardiac medications when more alert and tolerating oral intake.  Follow.   13.  Acute metabolic encephalopathy/ Likely multifactorial secondary to problems #1, 2 with underlying history of dementia..  Patient opens eyes to noxious stimuli.  Patient refusing to open his mouth.  Patient currently afebrile.  Patient pancultured results pending.  Ammonia levels within normal limits.  Continue IV fluid resuscitation.  Per daughter patient noted over the past few months to be minimally verbal at facility.  Due to underlying dementia, patient also seems to be slowly deteriorating.  Consult with palliative care for goals of care.  Follow.  14.  Adynamic ileus/ abdominal distention Patient with abdominal distention on 03/20/2019.  KUB ordered concerning for adynamic ileus.  Patient currently on bowel rest.  Patient noted to have large bowel movement early this morning.  Improved.  Keep potassium greater than 4.  Keep magnesium greater than 2.  Continue daily Dulcolax suppositories.  Place on clears.   15.  Urinary retention Per RN patient now with good urine output.  Monitor for now.  No need for Foley at this time.  Strict I's and O's.     DVT prophylaxis: Lovenox Code Status: DNR Family Communication: Tried calling daughter on telephone however no answer.   Disposition Plan: Transfer to [place].   Consultants:   None  Procedures:   Chest x-ray 03/19/2019  KUB 03/20/2019  Antimicrobials:   IV cefepime 03/19/2019  IV Flagyl 03/19/2019  IV vancomycin 03/19/2019>>>>> 03/21/2019   Subjective: Patient opens eyes to noxious  stimuli.  Nonverbal.  Patient noted to have large bowel movement this morning.  Objective: Vitals:   03/21/19 0600 03/21/19 0602 03/21/19 0656 03/21/19 0800  BP: (!) 142/79     Pulse: 86 78 76   Resp: _0 Temp:    98.6 F (37 C)  TempSrc:    Axillary  SpO2: (!) 78% 92% 98%   Weight:      Height:        Intake/Output Summary (Last 24 hours) at 03/21/2019 0913 Last data filed at 03/21/2019 0539 Gross per 24 hour  Intake 2970.05 ml  Output -  Net 2970.05 ml   Filed Weights   03/19/19 0733 03/20/19 0500 03/21/19 0500  Weight: 75.8 kg 80.9 kg 83.2 kg    Examination:  General exam: Sleepy. Dry mucous membranes. Respiratory system: Lungs clear to auscultation bilaterally anterior lung fields.  No wheezing, no crackles, no rhonchi. Respiratory effort normal. Cardiovascular system: Regular rate rhythm no murmurs rubs or gallops.  No JVD.  No lower extremity edema.  Gastrointestinal system: Abdomen is less distended, positive bowel sounds, nontender, no rebound, no guarding.  Central nervous system: Opens eyes to noxious  stimuli.  Nonverbal.  Moving extremities spontaneously.  Extremities: Symmetric 5 x 5 power. Skin: No rashes, lesions or ulcers Psychiatry: Judgement and insight unable to be assessed as patient opens eyes to noxious stimuli and nonverbal. Mood & affect appropriate.     Data Reviewed: I have personally reviewed following labs and imaging studies  CBC: Recent Labs  Lab 03/19/19 0155 03/19/19 0535 03/19/19 0645 03/20/19 0301 03/21/19 0236  WBC 9.8 11.0* 10.5 9.6 8.2  NEUTROABS 6.9  --  7.2  --  5.6  HGB 11.5* 10.5* 10.2* 9.0* 8.4*  HCT 41.6 38.1* 37.3* 34.1* 32.4*  MCV 73.2* 73.4* 74.7* 74.6* 78.8*  PLT 326 263 250 216 PLATELET CLUMPS NOTED ON SMEAR, UNABLE TO ESTIMATE   Basic Metabolic Panel: Recent Labs  Lab 03/19/19 0535 03/19/19 1429 03/20/19 0301 03/20/19 1714 03/21/19 0236  NA 160* 157* 156* 153* 153*  K 3.3* 3.4* 3.4* 4.1 3.6  CL  127* 123* 124* 123* 122*  CO2 _0 19*  GLUCOSE 159* 167* 163* 148* 142*  BUN 36* 33* 36* 31* 26*  CREATININE 1.22 1.16 1.17 1.05 0.96  CALCIUM 8.5* 8.1* 8.3* 8.4* 8.6*  MG 2.5*  --  2.4  --   --   PHOS 4.1  --   --   --   --    GFR: Estimated Creatinine Clearance: 70.7 mL/min (by C-G formula based on SCr of 0.96 mg/dL). Liver Function Tests: Recent Labs  Lab 03/19/19 0155 03/20/19 0301 03/21/19 0236  AST 99* 48* 30  ALT 80* 52* 36  ALKPHOS 65 49 44  BILITOT 1.0 0.5 0.5  PROT 8.5* 6.7 6.5  ALBUMIN 3.0* 2.5* 2.4*   No results for input(s): LIPASE, AMYLASE in the last 168 hours. Recent Labs  Lab 03/20/19 1002  AMMONIA 30   Coagulation Profile: Recent Labs  Lab 03/19/19 0535  INR 1.3*   Cardiac Enzymes: No results for input(s): CKTOTAL, CKMB, CKMBINDEX, TROPONINI in the last 168 hours. BNP (last 3 results) No results for input(s): PROBNP in the last 8760 hours. HbA1C: No results for input(s): HGBA1C in the last 72 hours. CBG: Recent Labs  Lab 03/20/19 1922  GLUCAP 132*   Lipid Profile: No results for input(s): CHOL, HDL, LDLCALC, TRIG, CHOLHDL, LDLDIRECT in the last 72 hours. Thyroid Function Tests: No results for input(s): TSH, T4TOTAL, FREET4, T3FREE, THYROIDAB in the last 72 hours. Anemia Panel: Recent Labs    03/19/19 1033  VITAMINB12 645  FOLATE 22.0  FERRITIN 205  TIBC 220*  IRON 35*   Sepsis Labs: Recent Labs  Lab 03/19/19 0155 03/19/19 0427 03/19/19 0535 03/20/19 0301  PROCALCITON  --   --  0.18  --   LATICACIDVEN 2.8* 2.0*  --  2.0*    Recent Results (from the past 240 hour(s))  Blood Culture (routine x 2)     Status: None (Preliminary result)   Collection Time: 03/19/19  1:55 AM   Specimen: BLOOD  Result Value Ref Range Status   Specimen Description   Final    BLOOD RIGHT ANTECUBITAL Performed at [facility], [place] [address], [place], [place] [address]    Special Requests   Final    BOTTLES DRAWN  AEROBIC AND ANAEROBIC Blood Culture adequate volume Performed at [place] [address], [place],  [address]    Culture   Final    NO GROWTH < 24 HOURS Performed at [place],  [place] [address]    Report Status PENDING  Incomplete  SARS Coronavirus 2 (CEPHEID- Performed in [place] hospital lab), Hosp Order     Status: None   Collection Time: 03/19/19  2:05 AM   Specimen: Nasopharyngeal Swab  Result Value Ref Range Status   SARS Coronavirus 2 NEGATIVE NEGATIVE Final    Comment: (NOTE) If result is NEGATIVE SARS-CoV-2 target nucleic acids are NOT DETECTED. The SARS-CoV-2 RNA is generally detectable in upper and lower  respiratory specimens during the acute phase of infection. The lowest  concentration of SARS-CoV-2 viral copies this assay can detect is 250  copies / mL. A negative result does not preclude SARS-CoV-2 infection  and should not be used as the sole basis for treatment or other  patient management decisions.  A negative result may occur with  improper specimen collection / handling, submission of specimen other  than nasopharyngeal swab, presence of viral mutation(s) within the  areas targeted by this assay, and inadequate number of viral copies  (<250 copies / mL). A negative result must be combined with clinical  observations, patient history, and epidemiological information. If result is POSITIVE SARS-CoV-2 target nucleic acids are DETECTED. The SARS-CoV-2 RNA is generally detectable in upper and lower  respiratory specimens dur ing the acute phase of infection.  Positive  results are indicative of active infection with SARS-CoV-2.  Clinical  correlation with patient history and other diagnostic information is  necessary to determine patient infection status.  Positive results do  not rule out bacterial infection or co-infection with other viruses. If result is PRESUMPTIVE POSTIVE SARS-CoV-2  nucleic acids MAY BE PRESENT.   A presumptive positive result was obtained on the submitted specimen  and confirmed on repeat testing.  While 2019 novel coronavirus  (SARS-CoV-2) nucleic acids may be present in the submitted sample  additional confirmatory testing may be necessary for epidemiological  and / or clinical management purposes  to differentiate between  SARS-CoV-2 and other Sarbecovirus currently known to infect humans.  If clinically indicated additional testing with an alternate test  methodology [id number]) is advised. The SARS-CoV-2 RNA is generally  detectable in upper and lower respiratory sp ecimens during the acute  phase of infection. The expected result is Negative. Fact Sheet for Patients:  StrictlyIdeas.no Fact Sheet for Healthcare Providers: BankingDealers.co.za This test is not yet approved or cleared by the [place] FDA and has been authorized for detection and/or diagnosis of SARS-CoV-2 by FDA under an Emergency Use Authorization (EUA).  This EUA will remain in effect (meaning this test can be used) for the duration of the COVID-19 declaration under Section 564(b)(1) of the Act, 21 U.S.C. section 360bbb-3(b)(1), unless the authorization is terminated or revoked sooner. Performed at [facility], [place] [address], [place], [place] [address]   Urine culture     Status: None   Collection Time: 03/19/19  4:00 AM   Specimen: Urine, Clean Catch  Result Value Ref Range Status   Specimen Description   Final    URINE, CLEAN CATCH Performed at [facility], [place] [address] [place], [place] [address]    Special Requests   Final    Normal Performed at [facility], [place] [address], [place], [place] [address]    Culture   Final    NO GROWTH Performed at [facility], [place] [address], [place], [place] [address]    Report Status 03/20/2019 FINAL  Final   MRSA PCR Screening  Status: None   Collection Time: 03/19/19  6:45 AM   Specimen: Nasal Mucosa; Nasopharyngeal  Result Value Ref Range Status   MRSA by PCR NEGATIVE NEGATIVE Final    Comment:        The GeneXpert MRSA Assay (FDA approved for NASAL specimens only), is one component of a comprehensive MRSA colonization surveillance program. It is not intended to diagnose MRSA infection nor to guide or monitor treatment for MRSA infections. Performed at [facility], [place] [address], [place], [place] [address]   Blood Culture (routine x 2)     Status: None (Preliminary result)   Collection Time: 03/19/19  6:50 AM   Specimen: BLOOD  Result Value Ref Range Status   Specimen Description   Final    BLOOD RIGHT ANTECUBITAL Performed at [place] [address] [place], [place] [address]    Special Requests   Final    BOTTLES DRAWN AEROBIC ONLY Blood Culture adequate volume Performed at [place] [address] [place], [place] [address]    Culture   Final    NO GROWTH < 12 HOURS Performed at [place] [address], [place], [place] [address]    Report Status PENDING  Incomplete         Radiology Studies: Dg Abd Portable 2v  Result Date: 03/20/2019 CLINICAL DATA:  Abdominal distention. EXAM: PORTABLE ABDOMEN - 2 VIEW COMPARISON:  CT 06/11/2017. FINDINGS: Soft tissue structures are unremarkable. Dilated loops of small and large bowel noted consistent adynamic ileus. No free air. Lumbar spine osteopenia degenerative change. No acute bony abnormality identified. Prior CABG. IMPRESSION: Dilated loops of small and large bowel noted suggesting adynamic ileus. Bowel obstruction cannot be excluded and follow-up exam to demonstrate resolution of bowel distention suggested. No free air. Electronically Signed   By: [place]   On: 03/20/2019 10:24        Scheduled Meds: . aspirin EC  81 mg Oral  Daily  . bisacodyl  10 mg Rectal Daily  . Chlorhexidine Gluconate Cloth  6 each Topical Daily  . enoxaparin (LOVENOX) injection  40 mg Subcutaneous Q24H  . latanoprost  1 drop Both Eyes QHS  . mouth rinse  15 mL Mouth Rinse BID  . pantoprazole (PROTONIX) IV  40 mg Intravenous Daily  . risperiDONE  0.125 mg Oral BID  . rivastigmine  4.5 mg Oral BID  . senna-docusate  2 tablet Oral Daily  . valproic acid  250 mg Oral QID   Continuous Infusions: . sodium chloride Stopped (03/19/19 1212)  . ceFEPime (MAXIPIME) IV Stopped (03/20/19 2136)  . dextrose 100 mL/hr at 03/21/19 0733  . metronidazole 500 mg (03/21/19 0902)  . potassium chloride       LOS: 2 days    Time spent: 40 minutes    Irine Seal, MD [facility]  If 7PM-7AM, please contact night-coverage [email or web address] 03/21/2019, 9:13 AM

## 2019-03-21 NOTE — Progress Notes (Signed)
OT Cancellation Note  Patient Details Name: Justin Griffin MRN: [id number] DOB: 1941-07-08   Cancelled Treatment:    Reason Eval/Treat Not Completed: OT screened, no needs identified, will sign off. Per notes, pt from SNF and needed total A.   [place] 03/21/2019, 1:03 PM  Lesle Chris, OTR/L Acute Rehabilitation Services [phone] [facility] pager [phone] office 03/21/2019

## 2019-03-21 NOTE — Progress Notes (Signed)
PT Cancellation Note / Screen  Patient Details Name: Justin Griffin MRN: [id number] DOB: Oct 13, 1940   Cancelled Treatment:    Reason Eval/Treat Not Completed: PT screened, no needs identified, will sign off Pt evaluated yesterday (please refer to this note) and does not appear appropriate for skilled therapy.  Per TOC note, pt lives at [facility] and dependent of care for ADLs and mobility.  PT to sign off.   Kasten Leveque,KATHrine E 03/21/2019, 12:01 PM Carmelia Bake, PT, DPT Acute Rehabilitation Services Office: [phone] Pager: [phone]

## 2019-03-21 NOTE — Consult Note (Signed)
Consultation Note Date: 03/21/2019   Patient Name: Justin Griffin  DOB: 22-Oct-1940  MRN: [id number]  Age / Sex: 78 y.o., male  PCP: [facility] [place] Referring Physician: Eugenie Filler, MD  Reason for Consultation: Establishing goals of care  HPI/Patient Profile: 78 y.o. male  with past medical history of dementia, CAD, RA, HLD, and HTN admitted on 03/19/2019 with AMS and fever. Diagnosed with sepsis from unclear source. Urinalysis and CXR negative. Treated with IV antibiotics and IV fluids. Also found to have hypernatremia - now down to 153 from 162. Admitted with AKI, now improving with hydration. KUB ordered 6/15 d/t abdominal distention and concerning for ileus. Had large BM morning of 6/16.Throughout hospitalization, patient has been minimally interactive - not working with PT/OT, nonverbal. Not taking PO meds. PMT consulted for [place].  Clinical Assessment and Goals of Care: I have reviewed medical records including EPIC notes, labs and imaging, and received report from RN.  Noted that patient is enrolled with [facility]; therefore, I first reached out to patient's [facility] social worker, Lelon Frohlich.  Lelon Frohlich shares that conversation was had with family at the end of last year about nearing end of life - at that time a MOST was updated/completed - Family elected DNR, limited medical interventions, antibiotics if needed, NO feeding tube.   Also discussed with Lelon Frohlich plan of care moving forward - we discussed that patient is likely hospice eligible, although likely not hospice facility eligible unless he continues to refuse ALL PO intake. Lelon Frohlich shares that if family is interested in receiving hospice care at [facility] patient would no longer be eligible for [facility] services.   Plan to discuss [place] with patient's daughter including reviewing MOST form - updating as needed and discussing eligibility for hospice if interested. Called and left voicemail with  daughter but awaiting return call. Will attempt again tomorrow.    Primary Decision Maker NEXT OF KIN - daughters    SUMMARY OF RECOMMENDATIONS   - patient enrolled with [facility] and has MOST form (DNR, limited medical interventions, NO feeding tube) - patient eligible for hospice at [facility] but this would mean cessation of [facility] services, likely not eligible for hospice facility placement unless he continues to refuse ALL PO intake - no answer from daughter, left voicemail, will attempt again 6/17  Code Status/Advance Care Planning:  DNR   Symptom Management:   No concerns per RN, resting peacefully, BM this AM  Palliative Prophylaxis:   Aspiration, Bowel Regimen, Delirium Protocol, Frequent Pain Assessment, Oral Care and Turn Reposition  Additional Recommendations (Limitations, Scope, Preferences):  No Artificial Feeding  Psycho-social/Spiritual:   Desire for further Chaplaincy support:no  Additional Recommendations: Education on Hospice  Prognosis:   < 6 months, potentially much less if continues to refuse all PO intake  Discharge Planning: To Be Determined      Primary Diagnoses: Present on Admission: . Sepsis ([place]) . Acute hypernatremia . GERD . Essential hypertension, benign . Dehydration . Coronary artery disease . Acute kidney injury ([place]) . Hypokalemia . Anemia   I have reviewed the medical record, interviewed the patient and family, and examined the patient. The following aspects are pertinent.  Past Medical History:  Diagnosis Date  . Alzheimer's disease ([place])   . CAD (coronary artery disease)    Catheterization 2006 LAD occluded, OM 95% stenosis followed by mid occlusion, first obtuse marginal occluded, right coronary artery diffuse moderate disease. LIMA to the LAD was patent. Saphenous vein to PDA and posterior  lateral patent with diffuse luminal irregularities, saphenous vein graft to second obtuse marginal is patent, saphenous vein graft to the  first obtuse marginal had 25% stenosis.  . Esophageal reflux   . Esophageal stricture   . Other and unspecified hyperlipidemia   . Other dysphagia   . Rheumatoid arthritis(714.0)   . Status post dilation of esophageal narrowing   . Unspecified essential hypertension    Social History   Socioeconomic History  . Marital status: Widowed    Spouse name: Not on file  . Number of children: 4  . Years of education: 10  . Highest education level: Not on file  Occupational History  . Occupation: RETIRED  Social Needs  . Financial resource strain: Not on file  . Food insecurity    Worry: Not on file    Inability: Not on file  . Transportation needs    Medical: Not on file    Non-medical: Not on file  Tobacco Use  . Smoking status: Former Smoker    Years: 0.00    Types: Cigarettes    Quit date: 10/05/1994    Years since quitting: 24.4  . Smokeless tobacco: Never Used  Substance and Sexual Activity  . Alcohol use: No  . Drug use: No  . Sexual activity: Not on file  Lifestyle  . Physical activity    Days per week: Not on file    Minutes per session: Not on file  . Stress: Not on file  Relationships  . Social Herbalist on phone: Not on file    Gets together: Not on file    Attends religious service: Not on file    Active member of club or organization: Not on file    Attends meetings of clubs or organizations: Not on file    Relationship status: Not on file  Other Topics Concern  . Not on file  Social History Narrative   Denies abuse and feel safe at home.   Fun: Watch TV.    Family History  Problem Relation Age of Onset  . Heart disease Maternal Grandfather   . Prostate cancer Paternal Grandfather   . Heart disease Mother   . Colon cancer Neg Hx   . Esophageal cancer Neg Hx   . Stomach cancer Neg Hx   . Pancreatic cancer Neg Hx   . Liver disease Neg Hx    Scheduled Meds: . aspirin EC  81 mg Oral Daily  . bacitracin   Topical BID  . bisacodyl  10 mg  Rectal Daily  . Chlorhexidine Gluconate Cloth  6 each Topical Daily  . enoxaparin (LOVENOX) injection  40 mg Subcutaneous Q24H  . latanoprost  1 drop Both Eyes QHS  . mouth rinse  15 mL Mouth Rinse BID  . pantoprazole (PROTONIX) IV  40 mg Intravenous Daily  . risperiDONE  0.125 mg Oral BID  . rivastigmine  4.5 mg Oral BID  . senna-docusate  2 tablet Oral Daily  . valproic acid  250 mg Oral QID   Continuous Infusions: . sodium chloride Stopped (03/19/19 1212)  . ceFEPime (MAXIPIME) IV Stopped (03/21/19 1213)  . dextrose 100 mL/hr at 03/21/19 1354  . metronidazole Stopped (03/21/19 1022)   PRN Meds:.sodium chloride, acetaminophen **OR** acetaminophen, albuterol, ipratropium, magnesium citrate, ondansetron **OR** ondansetron (ZOFRAN) IV, oxyCODONE Allergies  Allergen Reactions  . Zocor [Simvastatin] Other (See Comments)    Muscle pain    Vital Signs: BP 123/75 (BP Location: Left Arm)  Pulse 68   Temp 99.2 F (37.3 C) (Axillary)   Resp 18   Ht 6' (1.829 m)   Wt 83.2 kg   SpO2 92%   BMI 24.88 kg/m  Pain Scale: PAINAD   Pain Score: 0-No pain   SpO2: SpO2: 92 % O2 Device:SpO2: 92 % O2 Flow Rate: .O2 Flow Rate (L/min): 2 L/min  IO: Intake/output summary:   Intake/Output Summary (Last 24 hours) at 03/21/2019 1631 Last data filed at 03/21/2019 1354 Gross per 24 hour  Intake 4440.57 ml  Output -  Net 4440.57 ml    LBM: Last BM Date: 03/21/19 Baseline Weight: Weight: 75.8 kg Most recent weight: Weight: 83.2 kg     Palliative Assessment/Data: PPS 10%    The above conversation was completed via telephone due to the visitor restrictions during the COVID-19 pandemic. Thorough chart review and discussion with necessary members of the care team was completed as part of assessment. All issues were discussed and addressed but no physical exam was performed.  Time Total: 50 minutes Greater than 50%  of this time was spent counseling and coordinating care related to the above  assessment and plan.  Juel Burrow, DNP, AGNP-C Palliative Medicine Team [phone] Pager: [phone]

## 2019-03-21 NOTE — Progress Notes (Signed)
Speech Language Pathology Treatment: Dysphagia  Patient Details Name: Justin Griffin MRN: [id number] DOB: 1941/03/17 Today's Date: 03/21/2019 Time: 1640-1710 SLP Time Calculation (min) (ACUTE ONLY): 30 min  Assessment / Plan / Recommendation Clinical Impression  Pt did not follow commands to open his mouth but opens to accept po intake.  When initial bolus provided to pt, he orally held extensively and positioned himself as if he wanted to spit.   SLP suctioned copius secretions from pt's oral cavity.   After his oral cavity was clear, he would initiate swallow with po intake. At times, delay was up to 25 seconds - therefore it is imperative to have oral suction ready to use if pt does not elicit swallow.  He did swallow liquid medication given with tsp and SLP provided flavorful icee bolus after for palatability.  No indication of aspiration with po observed including medication, entire icee and 4 boluses of apple juice.    Items that provide improved sensory input including [place] ice, carbonated beverages, etc may be better tolerated and swallowed more efficiently by this pt.  He did require 2nd bolus of Mango Icee, when after first bolus he continued to orally hold.    Aspiration risk will be chronic due to his dementia, h/o esophageal deficits, bedridden status and reliance on others for feeding however mitigation strategies are in place.  Pt willingly opens his mouth to accept intake and careful hand feeding continues to be appropriate.   Again MOST form indicates no desire for feeding tube - which would carry high burden for lack of proven benefit.  Recommend continue clear liquids and transition to full when appropriate. SLP will follow up Thursday 03/23/2019 for tolerance, education.  Thanks for allowing me to help care for this pt.    RN who had pt in ICU today states she did not give him po intake as he was not fully alert.    HPI HPI: pt is a 77 yo male adm to [facility] with AMS, diagnosed  with sepsis.  Pt has h/o severe esophageal dysmotility, dementia, CAD, RA, HLD.  Pt is on Cefepime, Vanco, Flagyl.  Swallow eval ordered.  CXR negative.  Abd imaging concerning for possible adynamic ileus.  Per SNF notes, pt on puree/thin diet prior to admit.  MOST form indicates no feeding tube, limited interventions.  Pt underwent BSE yesterday but did not start on po diet due to his bowel issue.  Today follow up indicated to assure diet recommended yesterday is still appropriate.  Pt was started on clear liquid diet this am.  Bowel sounds are present.      SLP Plan  Continue with current plan of care       Recommendations  Diet recommendations: Thin liquid(clears) Liquids provided via: Cup;Straw;Teaspoon Medication Administration: Crushed with puree(via suspension or crushed, provide flavorful liquid after to ease taste) Supervision: Full supervision/cueing for compensatory strategies Compensations: Slow rate;Small sips/bites;Other (Comment)(orally suction if pt does not swallow) Postural Changes and/or Swallow Maneuvers: Seated upright 90 degrees;Upright 30-60 min after meal                Oral Care Recommendations: Oral care QID SLP Visit Diagnosis: Dysphagia, oral phase (R13.11);Dysphagia, unspecified (R13.10)(known esophageal) Plan: Continue with current plan of care       GO                Macario Golds 03/21/2019, 5:21 PM   Luanna Salk, [facility] SLP Acute Rehab Services Pager [phone] Office [phone]

## 2019-03-21 NOTE — TOC Progression Note (Addendum)
Transition of Care [facility]) - Progression Note    Patient Details  Name: Justin Griffin MRN: [id number] Date of Birth: 05/25/1941  Transition of Care [facility]) CM/SW [place], LCSW Phone Number: 03/21/2019, 3:44 PM  Clinical Narrative:    CSW received a call from the social worker Ann w/ [facility] [phone]). She reports the patient will return to [facility]. Patient is a long term care resident.  FL2 to be completed.   Expected Discharge Plan: Skilled Nursing Facility Barriers to Discharge: Continued Medical Work up  Expected Discharge Plan and Services Expected Discharge Plan: [place]   Discharge Planning Services: CM Consult   Living arrangements for the past 2 months: [place] Expected Discharge Date: (unknown)               DME Arranged: N/A DME Agency: NA       HH Arranged: NA HH Agency: NA        Social Determinants of Health (SDOH) Interventions    Readmission Risk Interventions Readmission Risk Prevention Plan 03/20/2019  Transportation Screening Complete  PCP or Specialist Appt within 3-5 Days Complete  HRI or [place] Complete  Social Work Consult for [place] Planning/Counseling Complete  Palliative Care Screening Not Applicable  Medication Review Press photographer) Complete  Some recent data might be hidden

## 2019-03-22 LAB — CBC WITH DIFFERENTIAL/PLATELET
Abs Immature Granulocytes: 0.05 10*3/uL (ref 0.00–0.07)
Basophils Absolute: 0 10*3/uL (ref 0.0–0.1)
Basophils Relative: 0 %
Eosinophils Absolute: 0.1 10*3/uL (ref 0.0–0.5)
Eosinophils Relative: 1 %
HCT: 28 % — ABNORMAL LOW (ref 39.0–52.0)
Hemoglobin: 7.5 g/dL — ABNORMAL LOW (ref 13.0–17.0)
Immature Granulocytes: 1 %
Lymphocytes Relative: 22 %
Lymphs Abs: 1.4 10*3/uL (ref 0.7–4.0)
MCH: 20.2 pg — ABNORMAL LOW (ref 26.0–34.0)
MCHC: 26.8 g/dL — ABNORMAL LOW (ref 30.0–36.0)
MCV: 75.3 fL — ABNORMAL LOW (ref 80.0–100.0)
Monocytes Absolute: 0.7 10*3/uL (ref 0.1–1.0)
Monocytes Relative: 10 %
Neutro Abs: 4.2 10*3/uL (ref 1.7–7.7)
Neutrophils Relative %: 66 %
Platelets: 223 10*3/uL (ref 150–400)
RBC: 3.72 MIL/uL — ABNORMAL LOW (ref 4.22–5.81)
RDW: 20.9 % — ABNORMAL HIGH (ref 11.5–15.5)
WBC: 6.4 10*3/uL (ref 4.0–10.5)
nRBC: 0.5 % — ABNORMAL HIGH (ref 0.0–0.2)

## 2019-03-22 LAB — BASIC METABOLIC PANEL
Anion gap: 9 (ref 5–15)
BUN: 11 mg/dL (ref 8–23)
CO2: 22 mmol/L (ref 22–32)
Calcium: 8.2 mg/dL — ABNORMAL LOW (ref 8.9–10.3)
Chloride: 115 mmol/L — ABNORMAL HIGH (ref 98–111)
Creatinine, Ser: 0.77 mg/dL (ref 0.61–1.24)
GFR calc Af Amer: >60 mL/min (ref >60)
GFR calc non Af Amer: >60 mL/min (ref >60)
Glucose, Bld: 140 mg/dL — ABNORMAL HIGH (ref 70–99)
Potassium: 3.5 mmol/L (ref 3.5–5.1)
Sodium: 146 mmol/L — ABNORMAL HIGH (ref 135–145)

## 2019-03-22 MED ORDER — SODIUM CHLORIDE 0.9 % IV SOLN
2.0000 g | Freq: Three times a day (TID) | INTRAVENOUS | Status: DC
  Administered 2019-03-22 – 2019-03-24 (×6): 2 g via INTRAVENOUS
  Filled 2019-03-22 (×7): qty 2

## 2019-03-22 MED ORDER — POTASSIUM CHLORIDE 10 MEQ/100ML IV SOLN
10.0000 meq | INTRAVENOUS | Status: AC
  Administered 2019-03-22 (×4): 10 meq via INTRAVENOUS
  Filled 2019-03-22 (×3): qty 100

## 2019-03-22 NOTE — Progress Notes (Signed)
Pharmacy Antibiotic Note  Justin Griffin is a 78 y.o. male admitted on 03/19/2019 with sepsis.  Pharmacy has been consulted for cefepime dosing. Patient is on day 4 of total antibiotic therapy. Per MD note on 6/16, tentative plan to treat for total of 5 - 7 days if no source identified. Patient's blood cultures from 6/14 remain negative, and urine culture from 6/14 was negative. Today patient remains afebrile, WBC WNL and down from 8.2 > 6.4, Scr stable at 0.77 (CrCl ~85 mL/min).  Plan: Increase cefepime to 2 Gm IV q8h (due to improved renal function) Continue metronidazole 500 mg IV q8h Monitor clinical progression, Scr, CBC, and BCx F/u LOT plans for antibiotics  Height: 6' (182.9 cm) Weight: 187 lb 6.3 oz (85 kg) IBW/kg (Calculated) : 77.6  Temp (24hrs), Avg:98.3 F (36.8 C), Min:97.7 F (36.5 C), Max:99.2 F (37.3 C)  Recent Labs  Lab 03/19/19 0155 03/19/19 0427 03/19/19 0535 03/19/19 0645  03/20/19 0301 03/20/19 1714 03/21/19 0236 03/21/19 0844 03/21/19 0957 03/21/19 1811 03/22/19 0559  WBC 9.8  --  11.0* 10.5  --  9.6  --  8.2  --   --   --  6.4  CREATININE 1.36*  --  1.22  --    < > 1.17 1.05 0.96  --  0.84 0.79 0.77  LATICACIDVEN 2.8* 2.0*  --   --   --  2.0*  --   --  2.3*  --   --   --    < > = values in this interval not displayed.    Estimated Creatinine Clearance: 84.9 mL/min (by C-G formula based on SCr of 0.77 mg/dL).    Allergies  Allergen Reactions  . Zocor [Simvastatin] Other (See Comments)    Muscle pain    Antimicrobials this admission: 6/14 Zosyn >> x1 ED 6/14 Vancomycin >> 6/15 6/14 Cefepime >> 6/14 Metronidazole >>  Dose adjustments this admission: 6/17: Increase cefepime 2g IV q12h to q8h  Microbiology results: 6/14 BCx: ngtd 6/14 UCx: negative 6/14 MRSA PCR: negative 6/14 COVID: negative  Thank you for allowing pharmacy to be a part of this patient's care.  Leron Croak, PharmD PGY1 Pharmacy Resident 03/22/2019 9:46 AM

## 2019-03-22 NOTE — Care Management Important Message (Signed)
Important Message  Patient Details IM Letter given to Kathrin Greathouse SW to present to the Patient Name: Justin Griffin MRN: [id number] Date of Birth: Jan 14, 1941   Medicare Important Message Given:  Yes    Kerin Salen 03/22/2019, 10:19 AM

## 2019-03-22 NOTE — Progress Notes (Signed)
PROGRESS NOTE    Justin Griffin  JJH:[id number] DOB: 1941-01-06 DOA: 03/19/2019 PCP: [facility]     Brief Narrative:  HPI per Dr. Rosamaria Lints note the entire history is obtained from the patient's emergency department chart, emergency department provider.Patient's personal history is limited by dementia and altered mental status.  Justin Griffin a 78 y.o.malewith a known history of dementia, CAD, RA, HLDpresents to the emergency department for evaluation of fever. Patient was in a usual state of health at his nursing home but was sent to ER for evaluation of fever. No known focal symptoms. Otherwise there has been no change in status. Patient has been taking medication as prescribed and there has been no recent change in medication or diet. No recent antibiotics. There has been no recent illness, hospitalizations, travel or sick contacts. Patient received Zosyn/Vanco. Medical admission has been requested for further management ofSepsis, unclear source, hypovolemic hypernatremia.  New events last 24 hours / Subjective: Patient with advanced dementia, largely nonverbal.  No acute events overnight.  Assessment & Plan:   Principal Problem:   Sepsis ([place]) Active Problems:   Essential hypertension, benign   Coronary artery disease   GERD   CORONARY ARTERY BYPASS GRAFT, HX OF   Acute kidney injury (HCC)   Dehydration   Acute hypernatremia   Hypokalemia   Anemia   Abdominal distention   Pressure injury of skin   Adynamic ileus (HCC)   Dementia without behavioral disturbance (HCC)   Goals of care, counseling/discussion   Palliative care by specialist   SIRS without known source of infection -Patient met criteria for sepsis on admission with fevers, leukocytosis, elevated lactic acid level of 2.8. However no source of infection found so far. Urinalysis nitrite negative, leukocytes negative, urine culture negative.  Chest x-ray negative.   Blood cultures negative to date.  SARS COVID negative.  Patient currently afebrile.  Lactic acid level at 2.0.  Continue IV fluids.  Abdominal films obtained on 03/20/2019 was consistent with adynamic ileus.  Continue IV cefepime and IV Flagyl.  Discontinue IV vancomycin. If no source identified could likely treat empirically for total of 5 days.  Due to poor oral intake will maintain patient on IV antibiotics for now.  Hypovolemic hypernatremia -Likely secondary to significant volume depletion/dehydration.  Patient currently on D5W. Sodium level improved today 146   Acute kidney injury -Likely secondary to a prerenal azotemia.    Creatinine back to baseline.  Resolved.  Hypokalemia -Replace, trend  Anemia of chronic disease -Likely anemia of chronic disease.  Patient with no overt bleeding. Trend   Gastroesophageal reflux disease -Continue PPI   Advanced dementia -Patient opens eyes to noxious stimuli.  Patient nonverbal.  Continue to hold his oral Celexa, mematadine, Depakote, Exelon.  Per daughter patient seems to have been declining over the past few months and currently nonverbal however opens eyes to noxious stimuli.  Patient admitted for fever.  Patient noted to be on a pured diet per daughter at facility.  Speech therapy following.  Consult with palliative care medicine for goals of care.  Hyperlipidemia  -Hold statin  Transaminitis -Likely secondary to dehydration.  LFTs trending down.  Hepatitis panel is negative.  Continue to hold statin  Hypertension -Patient refusing oral intake.  Blood pressure stable currently at 130/65 this morning.  Oral antihypertensive medications have been discontinued.  Follow for now.   Coronary artery disease -Stable  Acute metabolic encephalopathy -Likely multifactorial secondary to problems #1, 2 with underlying  history of dementia.  Patient opens eyes to noxious stimuli.  Patient refusing to open his mouth. Due to underlying  dementia, patient also seems to be slowly deteriorating.  Consult with palliative care for goals of care.    Adynamic ileus/ abdominal distention -Patient with abdominal distention on 03/20/2019.  KUB ordered concerning for adynamic ileus.  Improved   Acute urinary retention -Resolved   DVT prophylaxis: Lovenox Code Status: DNR Family Communication: None Disposition Plan: SNF    Consultants:   Palliative care medicine  Procedures:   None   Antimicrobials:  Anti-infectives (From admission, onward)   Start     Dose/Rate Route Frequency Ordered Stop   03/22/19 1800  ceFEPIme (MAXIPIME) 2 g in sodium chloride 0.9 % 100 mL IVPB     2 g 200 mL/hr over 30 Minutes Intravenous Every 8 hours 03/22/19 0954     03/20/19 0600  vancomycin (VANCOCIN) 1,250 mg in sodium chloride 0.9 % 250 mL IVPB  Status:  Discontinued     1,250 mg 166.7 mL/hr over 90 Minutes Intravenous Daily 03/19/19 0407 03/21/19 0746   03/19/19 1100  ceFEPIme (MAXIPIME) 2 g in sodium chloride 0.9 % 100 mL IVPB  Status:  Discontinued     2 g 200 mL/hr over 30 Minutes Intravenous Every 12 hours 03/19/19 0407 03/22/19 0954   03/19/19 0730  metroNIDAZOLE (FLAGYL) IVPB 500 mg     500 mg 100 mL/hr over 60 Minutes Intravenous Every 8 hours 03/19/19 0644     03/19/19 0645  ceFEPIme (MAXIPIME) 2 g in sodium chloride 0.9 % 100 mL IVPB  Status:  Discontinued     2 g 200 mL/hr over 30 Minutes Intravenous  Once 03/19/19 0644 03/19/19 0707   03/19/19 0645  vancomycin (VANCOCIN) IVPB 1000 mg/200 mL premix  Status:  Discontinued     1,000 mg 200 mL/hr over 60 Minutes Intravenous  Once 03/19/19 0644 03/19/19 0706   03/19/19 0400  vancomycin (VANCOCIN) 1,500 mg in sodium chloride 0.9 % 500 mL IVPB     1,500 mg 250 mL/hr over 120 Minutes Intravenous  Once 03/19/19 0355 03/19/19 0628   03/19/19 0315  vancomycin (VANCOCIN) IVPB 1000 mg/200 mL premix  Status:  Discontinued     1,000 mg 200 mL/hr over 60 Minutes Intravenous  Once  03/19/19 0303 03/19/19 0355   03/19/19 0315  piperacillin-tazobactam (ZOSYN) IVPB 3.375 g     3.375 g 12.5 mL/hr over 240 Minutes Intravenous  Once 03/19/19 0303 03/19/19 0415        Objective: Vitals:   03/21/19 1449 03/21/19 2113 03/22/19 0521 03/22/19 0633  BP: 123/75 125/74 130/65   Pulse: 68 72 66   Resp: 18 16 16   Temp: 99.2 F (37.3 C) 98.5 F (36.9 C) 97.7 F (36.5 C)   TempSrc: Axillary Axillary Axillary   SpO2: 92% 100% 100%   Weight:    85 kg  Height:        Intake/Output Summary (Last 24 hours) at 03/22/2019 1217 Last data filed at 03/22/2019 0521 Gross per 24 hour  Intake 442.53 ml  Output 250 ml  Net 192.53 ml   Filed Weights   03/20/19 0500 03/21/19 0500 03/22/19 0633  Weight: 80.9 kg 83.2 kg 85 kg    Examination:  General exam: Appears calm and comfortable, nonverbal  Respiratory system: Clear to auscultation. Respiratory effort normal. Cardiovascular system: S1 & S2 heard, RRR. No JVD, murmurs, rubs, gallops or clicks. No pedal edema. Gastrointestinal system: Abdomen is   nondistended, soft and nontender.  Central nervous system: Nonverbal, sleeping  Extremities: Symmetric  Skin: No rashes, lesions or ulcers Psychiatry: Advanced dementia   Data Reviewed: I have personally reviewed following labs and imaging studies  CBC: Recent Labs  Lab 03/19/19 0155 03/19/19 0535 03/19/19 0645 03/20/19 0301 03/21/19 0236 03/22/19 0559  WBC 9.8 11.0* 10.5 9.6 8.2 6.4  NEUTROABS 6.9  --  7.2  --  5.6 4.2  HGB 11.5* 10.5* 10.2* 9.0* 8.4* 7.5*  HCT 41.6 38.1* 37.3* 34.1* 32.4* 28.0*  MCV 73.2* 73.4* 74.7* 74.6* 78.8* 75.3*  PLT 326 263 250 216 PLATELET CLUMPS NOTED ON SMEAR, UNABLE TO ESTIMATE 314   Basic Metabolic Panel: Recent Labs  Lab 03/19/19 0535  03/20/19 0301 03/20/19 1714 03/21/19 0236 03/21/19 0957 03/21/19 1811 03/22/19 0559  NA 160*   < > 156* 153* 153* 150* 150* 146*  K 3.3*   < > 3.4* 4.1 3.6 3.1* 3.5 3.5  CL 127*   < > 124* 123*  122* 119* 119* 115*  CO2 26   < > 22 22 19* 20* 22 22  GLUCOSE 159*   < > 163* 148* 142* 134* 150* 140*  BUN 36*   < > 36* 31* 26* _0 CREATININE 1.22   < > 1.17 1.05 0.96 0.84 0.79 0.77  CALCIUM 8.5*   < > 8.3* 8.4* 8.6* 8.2* 8.3* 8.2*  MG 2.5*  --  2.4  --   --   --   --   --   PHOS 4.1  --   --   --   --   --   --   --    < > = values in this interval not displayed.   GFR: Estimated Creatinine Clearance: 84.9 mL/min (by C-G formula based on SCr of 0.77 mg/dL). Liver Function Tests: Recent Labs  Lab 03/19/19 0155 03/20/19 0301 03/21/19 0236  AST 99* 48* 30  ALT 80* 52* 36  ALKPHOS 65 49 44  BILITOT 1.0 0.5 0.5  PROT 8.5* 6.7 6.5  ALBUMIN 3.0* 2.5* 2.4*   No results for input(s): LIPASE, AMYLASE in the last 168 hours. Recent Labs  Lab 03/20/19 1002  AMMONIA 30   Coagulation Profile: Recent Labs  Lab 03/19/19 0535  INR 1.3*   Cardiac Enzymes: No results for input(s): CKTOTAL, CKMB, CKMBINDEX, TROPONINI in the last 168 hours. BNP (last 3 results) No results for input(s): PROBNP in the last 8760 hours. HbA1C: No results for input(s): HGBA1C in the last 72 hours. CBG: Recent Labs  Lab 03/20/19 1922  GLUCAP 132*   Lipid Profile: No results for input(s): CHOL, HDL, LDLCALC, TRIG, CHOLHDL, LDLDIRECT in the last 72 hours. Thyroid Function Tests: No results for input(s): TSH, T4TOTAL, FREET4, T3FREE, THYROIDAB in the last 72 hours. Anemia Panel: No results for input(s): VITAMINB12, FOLATE, FERRITIN, TIBC, IRON, RETICCTPCT in the last 72 hours. Sepsis Labs: Recent Labs  Lab 03/19/19 0155 03/19/19 0427 03/19/19 0535 03/20/19 0301 03/21/19 0844  PROCALCITON  --   --  0.18  --   --   LATICACIDVEN 2.8* 2.0*  --  2.0* 2.3*    Recent Results (from the past 240 hour(s))  Blood Culture (routine x 2)     Status: None (Preliminary result)   Collection Time: 03/19/19  1:55 AM   Specimen: BLOOD  Result Value Ref Range Status   Specimen Description   Final     BLOOD RIGHT ANTECUBITAL Performed at [facility], [place] [address] [place], [place] [address]    Special Requests   Final    BOTTLES DRAWN AEROBIC AND ANAEROBIC Blood Culture adequate volume Performed at [facility] [address] [place], [place] [address]    Culture   Final    NO GROWTH 2 DAYS Performed at [facility] [address], [place], [place] [address]    Report Status PENDING  Incomplete  SARS Coronavirus 2 (CEPHEID- Performed in [facility] hospital lab), Hosp Order     Status: None   Collection Time: 03/19/19  2:05 AM   Specimen: Nasopharyngeal Swab  Result Value Ref Range Status   SARS Coronavirus 2 NEGATIVE NEGATIVE Final    Comment: (NOTE) If result is NEGATIVE SARS-CoV-2 target nucleic acids are NOT DETECTED. The SARS-CoV-2 RNA is generally detectable in upper and lower  respiratory specimens during the acute phase of infection. The lowest  concentration of SARS-CoV-2 viral copies this assay can detect is 250  copies / mL. A negative result does not preclude SARS-CoV-2 infection  and should not be used as the sole basis for treatment or other  patient management decisions.  A negative result may occur with  improper specimen collection / handling, submission of specimen other  than nasopharyngeal swab, presence of viral mutation(s) within the  areas targeted by this assay, and inadequate number of viral copies  (<250 copies / mL). A negative result must be combined with clinical  observations, patient history, and epidemiological information. If result is POSITIVE SARS-CoV-2 target nucleic acids are DETECTED. The SARS-CoV-2 RNA is generally detectable in upper and lower  respiratory specimens dur ing the acute phase of infection.  Positive  results are indicative of active infection with SARS-CoV-2.  Clinical  correlation with patient history and other diagnostic information is  necessary to determine patient  infection status.  Positive results do  not rule out bacterial infection or co-infection with other viruses. If result is PRESUMPTIVE POSTIVE SARS-CoV-2 nucleic acids MAY BE PRESENT.   A presumptive positive result was obtained on the submitted specimen  and confirmed on repeat testing.  While 2019 novel coronavirus  (SARS-CoV-2) nucleic acids may be present in the submitted sample  additional confirmatory testing may be necessary for epidemiological  and / or clinical management purposes  to differentiate between  SARS-CoV-2 and other Sarbecovirus currently known to infect humans.  If clinically indicated additional testing with an alternate test  methodology [phone]) is advised. The SARS-CoV-2 RNA is generally  detectable in upper and lower respiratory sp ecimens during the acute  phase of infection. The expected result is Negative. Fact Sheet for Patients:  StrictlyIdeas.no Fact Sheet for Healthcare Providers: BankingDealers.co.za This test is not yet approved or cleared by the [place] FDA and has been authorized for detection and/or diagnosis of SARS-CoV-2 by FDA under an Emergency Use Authorization (EUA).  This EUA will remain in effect (meaning this test can be used) for the duration of the COVID-19 declaration under Section 564(b)(1) of the Act, 21 U.S.C. section 360bbb-3(b)(1), unless the authorization is terminated or revoked sooner. Performed at [facility], [place] [address] [place], [place] [address]   Urine culture     Status: None   Collection Time: 03/19/19  4:00 AM   Specimen: Urine, Clean Catch  Result Value Ref Range Status   Specimen Description   Final    URINE, CLEAN CATCH Performed at [facility], [place] [address] [place],  [place] [address]    Special Requests   Final    Normal Performed at [facility], [address] [place], [place] [address]     Culture   Final    NO GROWTH Performed at [facility], [address] [place], [place] [address]    Report Status 03/20/2019 FINAL  Final  MRSA PCR Screening     Status: None   Collection Time: 03/19/19  6:45 AM   Specimen: Nasal Mucosa; Nasopharyngeal  Result Value Ref Range Status   MRSA by PCR NEGATIVE NEGATIVE Final    Comment:        The GeneXpert MRSA Assay (FDA approved for NASAL specimens only), is one component of a comprehensive MRSA colonization surveillance program. It is not intended to diagnose MRSA infection nor to guide or monitor treatment for MRSA infections. Performed at [facility], [address] [place], [place] [address]   Blood Culture (routine x 2)     Status: None (Preliminary result)   Collection Time: 03/19/19  6:50 AM   Specimen: BLOOD  Result Value Ref Range Status   Specimen Description   Final    BLOOD RIGHT ANTECUBITAL Performed at [facility], [address] [place], [place] [address]    Special Requests   Final    BOTTLES DRAWN AEROBIC ONLY Blood Culture adequate volume Performed at [facility], [address] [place], [place] [address]    Culture   Final    NO GROWTH 2 DAYS Performed at [facility], [address] [place], [place] [address]    Report Status PENDING  Incomplete       Radiology Studies: Dg Abd Portable 2v  Result Date: 03/21/2019 CLINICAL DATA:  Ileus. EXAM: PORTABLE ABDOMEN - 2 VIEW COMPARISON:  03/20/2019 FINDINGS: There is persistent extensive air in the large and small bowel with slight distention of the bowel. No visible free air. There is suggestion of moderate stool in the rectum. No significant bone abnormality. IMPRESSION: 1. Persistent ileus. 2. Moderate stool in the rectum. Electronically Signed   By: James  Maxwell M.D.   On: 03/21/2019 11:27      Scheduled Meds: . aspirin EC  81 mg Oral Daily  . bacitracin   Topical BID   . bisacodyl  10 mg Rectal Daily  . Chlorhexidine Gluconate Cloth  6 each Topical Daily  . enoxaparin (LOVENOX) injection  40 mg Subcutaneous Q24H  . latanoprost  1 drop Both Eyes QHS  . mouth rinse  15 mL Mouth Rinse BID  . pantoprazole (PROTONIX) IV  40 mg Intravenous Daily  . risperiDONE  0.125 mg Oral BID  . rivastigmine  4.5 mg Oral BID  . senna-docusate  2 tablet Oral Daily  . valproic acid  250 mg Oral QID   Continuous Infusions: . sodium chloride Stopped (03/19/19 1212)  . ceFEPime (MAXIPIME) IV    . dextrose 100 mL/hr at 03/22/19 0616  . metronidazole 500 mg (03/22/19 0804)     LOS: 3 days    Time spent: 40 minutes    , DO [facility] [email or web address] 03/22/2019, 12:17 PM   

## 2019-03-22 NOTE — Progress Notes (Signed)
Palliative:  Attempted to call patient's daughter [name] at both home and cell numbers listed in chart - no answer, voice mails with return numbers left at both numbers. If any other team members speak with patient's family - please provide palliative medicine contact information at [phone].  Will attempt again tomorrow.  Thank you for this consult.  Juel Burrow, DNP, AGNP-C Palliative Medicine Team Team Phone # [phone]  Pager # [phone]  NO CHARGE

## 2019-03-23 LAB — BASIC METABOLIC PANEL
Anion gap: 9 (ref 5–15)
BUN: 7 mg/dL — ABNORMAL LOW (ref 8–23)
CO2: 22 mmol/L (ref 22–32)
Calcium: 8.2 mg/dL — ABNORMAL LOW (ref 8.9–10.3)
Chloride: 113 mmol/L — ABNORMAL HIGH (ref 98–111)
Creatinine, Ser: 0.62 mg/dL (ref 0.61–1.24)
GFR calc Af Amer: >60 mL/min (ref >60)
GFR calc non Af Amer: >60 mL/min (ref >60)
Glucose, Bld: 97 mg/dL (ref 70–99)
Potassium: 3.3 mmol/L — ABNORMAL LOW (ref 3.5–5.1)
Sodium: 144 mmol/L (ref 135–145)

## 2019-03-23 LAB — MAGNESIUM: Magnesium: 2 mg/dL (ref 1.7–2.4)

## 2019-03-23 LAB — CBC
HCT: 29.4 % — ABNORMAL LOW (ref 39.0–52.0)
Hemoglobin: 8.1 g/dL — ABNORMAL LOW (ref 13.0–17.0)
MCH: 20.5 pg — ABNORMAL LOW (ref 26.0–34.0)
MCHC: 27.6 g/dL — ABNORMAL LOW (ref 30.0–36.0)
MCV: 74.2 fL — ABNORMAL LOW (ref 80.0–100.0)
Platelets: 248 10*3/uL (ref 150–400)
RBC: 3.96 MIL/uL — ABNORMAL LOW (ref 4.22–5.81)
RDW: 21 % — ABNORMAL HIGH (ref 11.5–15.5)
WBC: 6.5 10*3/uL (ref 4.0–10.5)
nRBC: 0.3 % — ABNORMAL HIGH (ref 0.0–0.2)

## 2019-03-23 MED ORDER — POTASSIUM CHLORIDE 10 MEQ/100ML IV SOLN
10.0000 meq | INTRAVENOUS | Status: AC
  Administered 2019-03-23 (×4): 10 meq via INTRAVENOUS
  Filled 2019-03-23 (×4): qty 100

## 2019-03-23 NOTE — TOC Progression Note (Signed)
Transition of Care [facility]) - Progression Note    Patient Details  Name: Justin Griffin MRN: [id number] Date of Birth: 1941-04-03  Transition of Care [facility]) CM/SW [place], [place] Phone Number: 03/23/2019, 4:12 PM  Clinical Narrative:    Pending family decision for SNF discharge versus residential hospice.  Discussion with palliative care medicine ongoing.  TOC team following for disposition needs.   Expected Discharge Plan: [place] vs. Residential Hospice Barriers to Discharge: Continued Medical Work up  Expected Discharge Plan and Services Expected Discharge Plan: [place]   Discharge Planning Services: CM Consult   Living arrangements for the past 2 months: [place] Expected Discharge Date: (unknown)               DME Arranged: N/A DME Agency: NA       HH Arranged: NA HH Agency: NA         Social Determinants of Health (SDOH) Interventions    Readmission Risk Interventions Readmission Risk Prevention Plan 03/20/2019  Transportation Screening Complete  PCP or Specialist Appt within 3-5 Days Complete  HRI or Home Care Consult Complete  Social Work Consult for [place] Planning/Counseling Complete  Palliative Care Screening Not Applicable  Medication Review [employer]) Complete  Some recent data might be hidden

## 2019-03-23 NOTE — Progress Notes (Signed)
Daily Progress Note   Patient Name: Justin Griffin       Date: 03/23/2019 DOB: 24-Aug-1941  Age: 78 y.o. MRN#: [id number] Attending Physician: Justin Phi, DO Primary Care Physician: Justin Griffin Date: 03/19/2019  Reason for Consultation/Follow-up: Establishing goals of care  Subjective: Minimally interactive, minimal PO intake (charted that he ate 75% of breakfast but RN confirms this was only a few bites of liquid diet he consumed with much encouragement)  Length of Stay: 4  Current Medications: Scheduled Meds:  . aspirin EC  81 mg Oral Daily  . bacitracin   Topical BID  . bisacodyl  10 mg Rectal Daily  . Chlorhexidine Gluconate Cloth  6 each Topical Daily  . enoxaparin (LOVENOX) injection  40 mg Subcutaneous Q24H  . latanoprost  1 drop Both Eyes QHS  . mouth rinse  15 mL Mouth Rinse BID  . pantoprazole (PROTONIX) IV  40 mg Intravenous Daily  . risperiDONE  0.125 mg Oral BID  . rivastigmine  4.5 mg Oral BID  . senna-docusate  2 tablet Oral Daily  . valproic acid  250 mg Oral QID    Continuous Infusions: . sodium chloride Stopped (03/19/19 1212)  . ceFEPime (MAXIPIME) IV 2 g (03/23/19 1003)  . dextrose 100 mL/hr at 03/23/19 0828  . metronidazole 500 mg (03/23/19 0830)  . potassium chloride 10 mEq (03/23/19 1036)    PRN Meds: sodium chloride, acetaminophen **OR** acetaminophen, albuterol, ipratropium, magnesium citrate, ondansetron **OR** ondansetron (ZOFRAN) IV, oxyCODONE        Vital Signs: BP (!) 129/98 (BP Location: Right Arm)   Pulse 76   Temp 97.6 F (36.4 C) (Axillary)   Resp 19   Ht 6' (1.829 m)   Wt 84.7 kg   SpO2 95%   BMI 25.33 kg/m  SpO2: SpO2: 95 % O2 Device: O2 Device: Room Air O2 Flow Rate: O2 Flow Rate (L/min): 2 L/min  Intake/output summary:   Intake/Output Summary (Last 24 hours) at 03/23/2019 1115 Last data filed at 03/23/2019 0830 Gross per 24 hour  Intake 3780.37 ml  Output 2050 ml  Net 1730.37 ml   LBM: Last BM Date: 03/21/19 Baseline Weight: Weight: 75.8 kg Most recent weight: Weight: 84.7 kg       Palliative Assessment/Data: PPS 20%    Flowsheet Rows     Most Recent Value  Intake Tab  Referral Department  Hospitalist  Unit at Time of Referral  Intermediate Care Unit  Palliative Care Primary Diagnosis  Sepsis/Infectious Disease  Date Notified  03/21/19  Palliative Care Type  New Palliative care  Reason for referral  Clarify Goals of Care  Date of Admission  03/19/19  Date first seen by Palliative Care  03/21/19  # of days Palliative referral response time  0 Day(s)  # of days IP prior to Palliative referral  2  Clinical Assessment  Palliative Performance Scale Score  10%  Psychosocial & Spiritual Assessment  Palliative Care Outcomes  Patient/Family meeting held?  No      Patient Active Problem List   Diagnosis Date Noted  . Adynamic ileus (Justin Griffin) 03/21/2019  . Dementia without behavioral disturbance (Justin Griffin)   .  Goals of care, counseling/discussion   . Palliative care by specialist   . Pressure injury of skin 03/20/2019  . Abdominal distention   . Sepsis (Justin Griffin) 03/19/2019  . Acute hypernatremia 03/19/2019  . Hypokalemia 03/19/2019  . Anemia 03/19/2019  . Dehydration   . Delirium 06/11/2017  . Acute kidney injury (Justin Griffin) 06/11/2017  . Fever 06/11/2017  . Effusion of right knee 06/11/2017  . Inguinal hernia 12/24/2016  . Testicular pain, left 08/07/2016  . Hemorrhoid 08/07/2016  . 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[place]) 07/24/2016  . Routine general medical examination at a health care facility 01/27/2016  . Medicare annual wellness visit, subsequent 01/27/2016  . Essential hypertension, benign 08/08/2009  . GERD 07/04/2008  . RHEUMATOID  ARTHRITIS 07/04/2008  . WEIGHT LOSS-ABNORMAL 07/04/2008  . DYSPHAGIA 07/04/2008  . TOBACCO USER 07/03/2008  . POLYP, COLON 02/04/2008  . DYSLIPIDEMIA 02/04/2008  . Essential hypertension 02/04/2008  . Coronary artery disease 02/04/2008  . POLYPECTOMY, HX OF 02/04/2008  . CORONARY ARTERY BYPASS GRAFT, HX OF 02/04/2008    Palliative Care Assessment & Plan   HPI: 78 y.o. male  with past medical history of dementia, CAD, RA, HLD, and HTN admitted on 03/19/2019 with AMS and fever. Diagnosed with sepsis from unclear source. Urinalysis and CXR negative. Treated with IV antibiotics and IV fluids. Also found to have hypernatremia - now down to 153 from 162. Admitted with AKI, now improving with hydration. KUB ordered 6/15 d/t abdominal distention and concerning for ileus. Had large BM morning of 6/16.Throughout hospitalization, patient has been minimally interactive - not working with PT/OT, nonverbal. Not taking PO meds. PMT consulted for Justin Griffin.  Assessment: Received phone call from [facility] worker this AM - we discussed patient and discussed if patient may be more appropriate for [facility] facility instead of returning to SNF. Social worker shares [facility] a [facility] with [facility]. Also shares that from their review of the patient they agree that [facility] appears appropriate.  Discussed with Dr. Maylene Griffin this morning who shares the same - that [facility] may be more appropriate now instead of return to SNF.   Spoke with daughter Justin Griffin - this was my first interaction with patient's family. She shares that she makes decisions jointly with her sister, Justin Griffin. Introduced role of palliative care. She shares she is not sure how her father had been doing at SNF d/t COVID restrictions. Tells me she does know he was no longer ambulatory and when she called he did not speak over the phone. She tells me she was always told by staff that we was eating well.  We discussed his hospital course and treatments  used. We discussed [name] is stable, labs have been corrected - but, [name] continues to be minimally interactive and have very poor PO intake. She shares her desire to be with him at the bedside - she feels she could get him to eat.   Introduced [facility] care - she is familiar with [facility] care and tells me her mother died in a [facility] facility with cancer. She tells me "that was different, we knew my mother was dying, I don't know that my father is dying".   We discussed dementia disease process and progressive nature. Discussed that when someone stops walking/talking/interacting they are in the end stages of dementia. We discussed that people often reduce their intake and eventually stop eating as they near the end of life. We discussed that Mr. Grider was in a frail  state prior to hospitalization and this significant event is difficult for his body. She expresses understanding to all of this but still tells me she has difficulty accepting that he may be near end of life.  She has many questions about [facility] Griffin - if he could still take his regular meds, if he could eat, if he would be give unnecessary medications to hasten his death. Discussed these to the best of my ability, discussed that medications would be used to alleviate suffering if Mr. Miskell appeared to need them but not to hasten his death.   Justin Griffin is unsure about how to move forward and would like to discuss options with her sister. She tells me they will discuss this tonight - they will call me back tonight with any further questions. They will try to make a decision tonight or tomorrow regarding disposition.   Recommendations/Plan:  Initial conversation with patient's family - discussed patient condition and [facility] care  Daughter unsure how to move forward, unsure about [facility] - requests time to discuss with sister and will call back tonight or tomorrow with decision  Patient enrolled with [facility], has MOST (DNR, limited medical  interventions, NO feeding tube)  Goals of Care and Additional Recommendations:  Limitations on Scope of Treatment: No Artificial Feeding  Code Status:  DNR  Prognosis:   < 2 weeks if continues to refuse PO intake  Discharge Planning:  To Be Determined  Care plan was discussed with patients daughter, Dr. Maylene Griffin, bedside RN  Thank you for allowing the Palliative Medicine Team to assist in the care of this patient.   Total Time 45 minutes Prolonged Time Billed  no    The above conversation was completed via telephone due to the visitor restrictions during the COVID-19 pandemic. Thorough chart review and discussion with necessary members of the care team was completed as part of assessment. All issues were discussed and addressed but no physical exam was performed.    Greater than 50%  of this time was spent counseling and coordinating care related to the above assessment and plan.  Juel Burrow, DNP, [facility] Palliative Medicine Team Team Phone # [phone]  Pager [phone]

## 2019-03-23 NOTE — Progress Notes (Signed)
PROGRESS NOTE    Justin Griffin  STM:[id number] DOB: 1941-04-13 DOA: 03/19/2019 PCP: [facility]     Brief Narrative:  HPI per Justin Griffin note the entire history is obtained from the patient's emergency department chart, emergency department provider.Patient's personal history is limited by dementia and altered mental status.  Justin Griffin a 78 y.o.malewith a known history of dementia, CAD, RA, HLDpresents to the emergency department for evaluation of fever. Patient was in a usual state of health at his nursing home but was sent to ER for evaluation of fever. No known focal symptoms. Otherwise there has been no change in status. Patient has been taking medication as prescribed and there has been no recent change in medication or diet. No recent antibiotics. There has been no recent illness, hospitalizations, travel or sick contacts. Patient received Zosyn/Vanco. Medical admission has been requested for further management ofSepsis, unclear source, hypovolemic hypernatremia.  New events last 24 hours / Subjective: No acute events.  Remains nonverbal minimal p.o. intake reported. Nothing by mouth on June 17, 120 mL June 18.  Assessment & Plan:   Principal Problem:   Sepsis ([place]) Active Problems:   Essential hypertension, benign   Coronary artery disease   GERD   CORONARY ARTERY BYPASS GRAFT, HX OF   Acute kidney injury (HCC)   Dehydration   Acute hypernatremia   Hypokalemia   Anemia   Abdominal distention   Pressure injury of skin   Adynamic ileus (HCC)   Dementia without behavioral disturbance (HCC)   Goals of care, counseling/discussion   Palliative care by specialist   SIRS without known source of infection -Patient met criteria for sepsis on admission with fevers, leukocytosis, elevated lactic acid level of 2.8. However no source of infection found so far. Urinalysis nitrite negative, leukocytes negative, urine culture  negative.  Chest x-ray negative.  Blood cultures negative to date.  SARS COVID negative.  Patient currently afebrile.  Lactic acid level at 2.0.  Continue IV fluids.  Abdominal films obtained on 03/20/2019 was consistent with adynamic ileus.  Continue IV cefepime and IV Flagyl.  Discontinue IV vancomycin. If no source identified could likely treat empirically for total of 5 days.  Due to poor oral intake will maintain patient on IV antibiotics for now.  Hypovolemic hypernatremia -Likely secondary to significant volume depletion/dehydration.  Patient currently on D5W.  Resolved  Acute kidney injury -Likely secondary to a prerenal azotemia. Resolved.  Hypokalemia -Replace, trend.  Medium normal  Anemia of chronic disease -Likely anemia of chronic disease.  Patient with no overt bleeding. Trend .  Hemoglobin stable 8.1  Gastroesophageal reflux disease -Continue PPI   Advanced dementia -Patient opens eyes to noxious stimuli.  Patient nonverbal.  Continue to hold his oral Celexa, mematadine, Depakote, Exelon.  Per daughter patient seems to have been declining over the past few months and currently nonverbal however opens eyes to noxious stimuli.  Patient admitted for fever.  Patient noted to be on a pured diet per daughter at facility.  Speech therapy following.  Consult with palliative care medicine for goals of care.  Hyperlipidemia  -Hold statin  Transaminitis -Likely secondary to dehydration.  LFTs trending down.  Hepatitis panel is negative.  Continue to hold statin  Hypertension -Patient refusing oral intake.  Blood pressure stable currently at 129/98 this morning.  Oral antihypertensive medications have been discontinued.  Follow for now.   Coronary artery disease -Stable  Acute metabolic encephalopathy -Likely multifactorial secondary to problems #  1, 2 with underlying history of dementia.  Patient opens eyes to noxious stimuli.  Patient refusing to open his mouth. Due  to underlying dementia, patient also seems to be slowly deteriorating.  Consult with palliative care for goals of care.    Adynamic ileus/ abdominal distention -Patient with abdominal distention on 03/20/2019.  KUB ordered concerning for adynamic ileus.  Improved   Acute urinary retention -Resolved   DVT prophylaxis: Lovenox Code Status: DNR Family Communication: Spoke with daughter over the phone Disposition Plan: Pending family decision for SNF discharge versus residential hospice.  Discussion with palliative care medicine ongoing   Consultants:   Palliative care medicine  Procedures:   None   Antimicrobials:  Anti-infectives (From admission, onward)   Start     Dose/Rate Route Frequency Ordered Stop   03/22/19 1800  ceFEPIme (MAXIPIME) 2 g in sodium chloride 0.9 % 100 mL IVPB     2 g 200 mL/hr over 30 Minutes Intravenous Every 8 hours 03/22/19 0954     03/20/19 0600  vancomycin (VANCOCIN) 1,250 mg in sodium chloride 0.9 % 250 mL IVPB  Status:  Discontinued     1,250 mg 166.7 mL/hr over 90 Minutes Intravenous Daily 03/19/19 0407 03/21/19 0746   03/19/19 1100  ceFEPIme (MAXIPIME) 2 g in sodium chloride 0.9 % 100 mL IVPB  Status:  Discontinued     2 g 200 mL/hr over 30 Minutes Intravenous Every 12 hours 03/19/19 0407 03/22/19 0954   03/19/19 0730  metroNIDAZOLE (FLAGYL) IVPB 500 mg     500 mg 100 mL/hr over 60 Minutes Intravenous Every 8 hours 03/19/19 0644     03/19/19 0645  ceFEPIme (MAXIPIME) 2 g in sodium chloride 0.9 % 100 mL IVPB  Status:  Discontinued     2 g 200 mL/hr over 30 Minutes Intravenous  Once 03/19/19 0644 03/19/19 0707   03/19/19 0645  vancomycin (VANCOCIN) IVPB 1000 mg/200 mL premix  Status:  Discontinued     1,000 mg 200 mL/hr over 60 Minutes Intravenous  Once 03/19/19 0644 03/19/19 0706   03/19/19 0400  vancomycin (VANCOCIN) 1,500 mg in sodium chloride 0.9 % 500 mL IVPB     1,500 mg 250 mL/hr over 120 Minutes Intravenous  Once 03/19/19 0355 03/19/19  0628   03/19/19 0315  vancomycin (VANCOCIN) IVPB 1000 mg/200 mL premix  Status:  Discontinued     1,000 mg 200 mL/hr over 60 Minutes Intravenous  Once 03/19/19 0303 03/19/19 0355   03/19/19 0315  piperacillin-tazobactam (ZOSYN) IVPB 3.375 g     3.375 g 12.5 mL/hr over 240 Minutes Intravenous  Once 03/19/19 0303 03/19/19 0415       Objective: Vitals:   03/22/19 0633 03/22/19 1339 03/22/19 2014 03/23/19 0505  BP:  127/61 (!) 141/69 (!) 129/98  Pulse:  65 69 76  Resp:  '20 19 19  ' Temp:  98 F (36.7 C) 98.4 F (36.9 C) 97.6 F (36.4 C)  TempSrc:  Oral Axillary Axillary  SpO2:  96% 95% 95%  Weight: 85 kg   84.7 kg  Height:        Intake/Output Summary (Last 24 hours) at 03/23/2019 1259 Last data filed at 03/23/2019 1155 Gross per 24 hour  Intake 4474.7 ml  Output 2050 ml  Net 2424.7 ml   Filed Weights   03/21/19 0500 03/22/19 0633 03/23/19 0505  Weight: 83.2 kg 85 kg 84.7 kg    Examination: General exam: Appears calm and comfortable, nonverbal Respiratory system: Clear to auscultation. Respiratory  effort normal. Cardiovascular system: S1 & S2 heard, RRR. No JVD, murmurs, rubs, gallops or clicks. No pedal edema. Gastrointestinal system: Abdomen is nondistended, soft and nontender. Central nervous system: Does not open his eyes to verbal stimuli Extremities: Symmetric  Skin: No rashes, lesions or ulcers Psychiatry: Advanced dementia   Data Reviewed: I have personally reviewed following labs and imaging studies  CBC: Recent Labs  Lab 03/19/19 0155  03/19/19 0645 03/20/19 0301 03/21/19 0236 03/22/19 0559 03/23/19 0553  WBC 9.8   < > 10.5 9.6 8.2 6.4 6.5  NEUTROABS 6.9  --  7.2  --  5.6 4.2  --   HGB 11.5*   < > 10.2* 9.0* 8.4* 7.5* 8.1*  HCT 41.6   < > 37.3* 34.1* 32.4* 28.0* 29.4*  MCV 73.2*   < > 74.7* 74.6* 78.8* 75.3* 74.2*  PLT 326   < > 250 216 PLATELET CLUMPS NOTED ON SMEAR, UNABLE TO ESTIMATE 223 248   < > = values in this interval not displayed.    Basic Metabolic Panel: Recent Labs  Lab 03/19/19 0535  03/20/19 0301  03/21/19 0236 03/21/19 0957 03/21/19 1811 03/22/19 0559 03/23/19 0553  NA 160*   < > 156*   < > 153* 150* 150* 146* 144  K 3.3*   < > 3.4*   < > 3.6 3.1* 3.5 3.5 3.3*  CL 127*   < > 124*   < > 122* 119* 119* 115* 113*  CO2 26   < > 22   < > 19* 20* '22 22 22  ' GLUCOSE 159*   < > 163*   < > 142* 134* 150* 140* 97  BUN 36*   < > 36*   < > 26* '21 18 11 ' 7*  CREATININE 1.22   < > 1.17   < > 0.96 0.84 0.79 0.77 0.62  CALCIUM 8.5*   < > 8.3*   < > 8.6* 8.2* 8.3* 8.2* 8.2*  MG 2.5*  --  2.4  --   --   --   --   --  2.0  PHOS 4.1  --   --   --   --   --   --   --   --    < > = values in this interval not displayed.   GFR: Estimated Creatinine Clearance: 84.9 mL/min (by C-G formula based on SCr of 0.62 mg/dL). Liver Function Tests: Recent Labs  Lab 03/19/19 0155 03/20/19 0301 03/21/19 0236  AST 99* 48* 30  ALT 80* 52* 36  ALKPHOS 65 49 44  BILITOT 1.0 0.5 0.5  PROT 8.5* 6.7 6.5  ALBUMIN 3.0* 2.5* 2.4*   No results for input(s): LIPASE, AMYLASE in the last 168 hours. Recent Labs  Lab 03/20/19 1002  AMMONIA 30   Coagulation Profile: Recent Labs  Lab 03/19/19 0535  INR 1.3*   Cardiac Enzymes: No results for input(s): CKTOTAL, CKMB, CKMBINDEX, TROPONINI in the last 168 hours. BNP (last 3 results) No results for input(s): PROBNP in the last 8760 hours. HbA1C: No results for input(s): HGBA1C in the last 72 hours. CBG: Recent Labs  Lab 03/20/19 1922  GLUCAP 132*   Lipid Profile: No results for input(s): CHOL, HDL, LDLCALC, TRIG, CHOLHDL, LDLDIRECT in the last 72 hours. Thyroid Function Tests: No results for input(s): TSH, T4TOTAL, FREET4, T3FREE, THYROIDAB in the last 72 hours. Anemia Panel: No results for input(s): VITAMINB12, FOLATE, FERRITIN, TIBC, IRON, RETICCTPCT in the last 72 hours. Sepsis Labs:  Recent Labs  Lab 03/19/19 0155 03/19/19 0427 03/19/19 0535 03/20/19 0301 03/21/19 0844   PROCALCITON  --   --  0.18  --   --   LATICACIDVEN 2.8* 2.0*  --  2.0* 2.3*    Recent Results (from the past 240 hour(s))  Blood Culture (routine x 2)     Status: None (Preliminary result)   Collection Time: 03/19/19  1:55 AM   Specimen: BLOOD  Result Value Ref Range Status   Specimen Description   Final    BLOOD RIGHT ANTECUBITAL Performed at [facility], [place] [address] [place], [place] [address]    Special Requests   Final    BOTTLES DRAWN AEROBIC AND ANAEROBIC Blood Culture adequate volume Performed at [place] [address] [place], [place] [address]    Culture   Final    NO GROWTH 3 DAYS Performed at [facility], [place] [address], [place],  [address]    Report Status PENDING  Incomplete  SARS Coronavirus 2 (CEPHEID- Performed in [facility] [place] [facility] hospital lab), Hosp Order     Status: None   Collection Time: 03/19/19  2:05 AM   Specimen: Nasopharyngeal Swab  Result Value Ref Range Status   SARS Coronavirus 2 NEGATIVE NEGATIVE Final    Comment: (NOTE) If result is NEGATIVE SARS-CoV-2 target nucleic acids are NOT DETECTED. The SARS-CoV-2 RNA is generally detectable in upper and lower  respiratory specimens during the acute phase of infection. The lowest  concentration of SARS-CoV-2 viral copies this assay can detect is 250  copies / mL. A negative result does not preclude SARS-CoV-2 infection  and should not be used as the sole basis for treatment or other  patient management decisions.  A negative result may occur with  improper specimen collection / handling, submission of specimen other  than nasopharyngeal swab, presence of viral mutation(s) within the  areas targeted by this assay, and inadequate number of viral copies  (<250 copies / mL). A negative result must be combined with clinical  observations, patient history, and epidemiological information. If result is POSITIVE SARS-CoV-2 target nucleic acids are  DETECTED. The SARS-CoV-2 RNA is generally detectable in upper and lower  respiratory specimens dur ing the acute phase of infection.  Positive  results are indicative of active infection with SARS-CoV-2.  Clinical  correlation with patient history and other diagnostic information is  necessary to determine patient infection status.  Positive results do  not rule out bacterial infection or co-infection with other viruses. If result is PRESUMPTIVE POSTIVE SARS-CoV-2 nucleic acids MAY BE PRESENT.   A presumptive positive result was obtained on the submitted specimen  and confirmed on repeat testing.  While 2019 novel coronavirus  (SARS-CoV-2) nucleic acids may be present in the submitted sample  additional confirmatory testing may be necessary for epidemiological  and / or clinical management purposes  to differentiate between  SARS-CoV-2 and other Sarbecovirus currently known to infect humans.  If clinically indicated additional testing with an alternate test  methodology [phone]) is advised. The SARS-CoV-2 RNA is generally  detectable in upper and lower respiratory sp ecimens during the acute  phase of infection. The expected result is Negative. Fact Sheet for Patients:  StrictlyIdeas.no Fact Sheet for Healthcare Providers: BankingDealers.co.za This test is not yet approved or cleared by the [place] FDA and has been authorized for detection and/or diagnosis of SARS-CoV-2 by FDA under an Emergency Use Authorization (EUA).  This EUA will remain in  effect (meaning this test can be used) for the duration of the COVID-19 declaration under Section 564(b)(1) of the Act, 21 U.S.C. section 360bbb-3(b)(1), unless the authorization is terminated or revoked sooner. Performed at [facility], [place] [address] [place], [place] [address]   Urine culture     Status: None   Collection Time: 03/19/19  4:00 AM   Specimen: Urine,  Clean Catch  Result Value Ref Range Status   Specimen Description   Final    URINE, CLEAN CATCH Performed at [facility], [place] [address] [place], [place] [address]    Special Requests   Final    Normal Performed at [facility], [place] [address] [place], [place] [address]    Culture   Final    NO GROWTH Performed at [facility], [place] [address], [place], [place] [address]    Report Status 03/20/2019 FINAL  Final  MRSA PCR Screening     Status: None   Collection Time: 03/19/19  6:45 AM   Specimen: Nasal Mucosa; Nasopharyngeal  Result Value Ref Range Status   MRSA by PCR NEGATIVE NEGATIVE Final    Comment:        The GeneXpert MRSA Assay (FDA approved for NASAL specimens only), is one component of a comprehensive MRSA colonization surveillance program. It is not intended to diagnose MRSA infection nor to guide or monitor treatment for MRSA infections. Performed at [facility], [place] [address] [place], [place] [address]   Blood Culture (routine x 2)     Status: None (Preliminary result)   Collection Time: 03/19/19  6:50 AM   Specimen: BLOOD  Result Value Ref Range Status   Specimen Description   Final    BLOOD RIGHT ANTECUBITAL Performed at [facility] [address], [place], [place] [address]    Special Requests   Final    BOTTLES DRAWN AEROBIC ONLY Blood Culture adequate volume Performed at [place] [address], [place], [place] [address]    Culture   Final    NO GROWTH 3 DAYS Performed at [facility], [place] [address], [place], [place] [address]    Report Status PENDING  Incomplete       Radiology Studies: No results found.    Scheduled Meds: . aspirin EC  81 mg Oral Daily  . bacitracin   Topical BID  . bisacodyl  10 mg Rectal Daily  . Chlorhexidine Gluconate Cloth  6 each Topical Daily  . enoxaparin (LOVENOX) injection  40 mg  Subcutaneous Q24H  . latanoprost  1 drop Both Eyes QHS  . mouth rinse  15 mL Mouth Rinse BID  . pantoprazole (PROTONIX) IV  40 mg Intravenous Daily  . risperiDONE  0.125 mg Oral BID  . rivastigmine  4.5 mg Oral BID  . senna-docusate  2 tablet Oral Daily  . valproic acid  250 mg Oral QID   Continuous Infusions: . sodium chloride Stopped (03/19/19 1212)  . ceFEPime (MAXIPIME) IV Stopped (03/23/19 1034)  . dextrose 100 mL/hr at 03/23/19 1155  . metronidazole Stopped (03/23/19 0930)     LOS: 4 days    Time spent: 40 minutes   Dessa Phi, DO [facility] [email or web address] 03/23/2019, 12:59 PM

## 2019-03-23 NOTE — NC FL2 (Signed)
[place] LEVEL OF CARE SCREENING TOOL     IDENTIFICATION  Patient Name: Justin Griffin Birthdate: [birth year] Sex: male Admission Date (Current Location): 03/19/2019  [facility] and [place] Number:  Herbalist and Address:  [facility],  [place], [place]      Provider Number: [id number]  Attending Physician Name and Address:  Dessa Phi, DO  Relative Name and Phone Number:       Current Level of Care: Hospital Recommended Level of Care: Lane Prior Approval Number:    Date Approved/Denied:   PASRR Number:    Discharge Plan: SNF    Current Diagnoses: Patient Active Problem List   Diagnosis Date Noted  . Adynamic ileus ([place]) 03/21/2019  . Dementia without behavioral disturbance ([place])   . Goals of care, counseling/discussion   . Palliative care by specialist   . Pressure injury of skin 03/20/2019  . Abdominal distention   . Sepsis ([place]) 03/19/2019  . Acute hypernatremia 03/19/2019  . Hypokalemia 03/19/2019  . Anemia 03/19/2019  . Dehydration   . Delirium 06/11/2017  . Acute kidney injury ([place]) 06/11/2017  . Fever 06/11/2017  . Effusion of right knee 06/11/2017  . Inguinal hernia 12/24/2016  . Testicular pain, left 08/07/2016  . Hemorrhoid 08/07/2016  . 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[place]) 07/24/2016  . Routine general medical examination at a health care facility 01/27/2016  . Medicare annual wellness visit, subsequent 01/27/2016  . Essential hypertension, benign 08/08/2009  . GERD 07/04/2008  . RHEUMATOID ARTHRITIS 07/04/2008  . WEIGHT LOSS-ABNORMAL 07/04/2008  . DYSPHAGIA 07/04/2008  . TOBACCO USER 07/03/2008  . POLYP, COLON 02/04/2008  . DYSLIPIDEMIA 02/04/2008  . Essential hypertension 02/04/2008  . Coronary artery disease 02/04/2008  . POLYPECTOMY, HX OF 02/04/2008  . CORONARY ARTERY BYPASS GRAFT, HX OF 02/04/2008    Orientation RESPIRATION  BLADDER Height & Weight     Self(UTA - will moan to voice, but otherwise mute)  Normal Incontinent Weight: 186 lb 11.7 oz (84.7 kg) Height:  6' (182.9 cm)  BEHAVIORAL SYMPTOMS/MOOD NEUROLOGICAL BOWEL NUTRITION STATUS      Incontinent Diet(Liquid)  AMBULATORY STATUS COMMUNICATION OF NEEDS Skin   Extensive Assist Non-Verbally Surgical wounds, Other (Comment)(pressure injury left ankle; lateral unstageable and wound/incision right hand anterior)                       Personal Care Assistance Level of Assistance  Bathing, Feeding, Dressing Bathing Assistance: Maximum assistance Feeding assistance: Maximum assistance Dressing Assistance: Maximum assistance Total Care Assistance: Maximum assistance   Functional Limitations Info  Sight, Hearing, Speech Sight Info: Impaired Hearing Info: Adequate Speech Info: Impaired(Incomprehensible)    SPECIAL CARE FACTORS FREQUENCY  PT (By licensed PT), OT (By licensed OT)     PT Frequency: 5x weekly OT Frequency: 5x weekly            Contractures Contractures Info: Not present    Additional Factors Info  Code Status, Allergies, Psychotropic Code Status Info: DNR Allergies Info: Zocor - Simvastatin Psychotropic Info: risperiDONE tablet 0.125 mg         Current Medications (03/23/2019):  This is the current hospital active medication list Current Facility-Administered Medications  Medication Dose Route Frequency Provider Last Rate Last Dose  . 0.9 %  sodium chloride infusion   Intravenous PRN Eugenie Filler, MD   Stopped at 03/19/19 1212  . acetaminophen (TYLENOL) tablet 650 mg  650 mg  Oral Q6H PRN Eugenie Filler, MD       Or  . acetaminophen (TYLENOL) suppository 650 mg  650 mg Rectal Q6H PRN Eugenie Filler, MD      . albuterol (PROVENTIL) (2.5 MG/3ML) 0.083% nebulizer solution 2.5 mg  2.5 mg Nebulization Q6H PRN Eugenie Filler, MD      . aspirin EC tablet 81 mg  81 mg Oral Daily Eugenie Filler, MD      .  bacitracin ointment   Topical BID Eugenie Filler, MD   1 application at 35/00/93 0919  . bisacodyl (DULCOLAX) suppository 10 mg  10 mg Rectal Daily Eugenie Filler, MD   10 mg at 03/23/19 0914  . ceFEPIme (MAXIPIME) 2 g in sodium chloride 0.9 % 100 mL IVPB  2 g Intravenous Q8H Britt Boozer, RPH 200 mL/hr at 03/23/19 1003 2 g at 03/23/19 1003  . Chlorhexidine Gluconate Cloth 2 % PADS 6 each  6 each Topical Daily Eugenie Filler, MD   6 each at 03/23/19 1001  . dextrose 5 % solution   Intravenous Continuous Eugenie Filler, MD 100 mL/hr at 03/23/19 [phone]    . enoxaparin (LOVENOX) injection 40 mg  40 mg Subcutaneous Q24H Eugenie Filler, MD   40 mg at 03/23/19 0913  . ipratropium (ATROVENT) nebulizer solution 0.5 mg  0.5 mg Nebulization Q6H PRN Eugenie Filler, MD      . latanoprost (XALATAN) 0.005 % ophthalmic solution 1 drop  1 drop Both Eyes QHS Eugenie Filler, MD   1 drop at 03/22/19 2158  . magnesium citrate solution 1 Bottle  1 Bottle Oral Once PRN Eugenie Filler, MD      . MEDLINE mouth rinse  15 mL Mouth Rinse BID Eugenie Filler, MD   15 mL at 03/23/19 0911  . metroNIDAZOLE (FLAGYL) IVPB 500 mg  500 mg Intravenous Q8H Eugenie Filler, MD 100 mL/hr at 03/23/19 0830 500 mg at 03/23/19 0830  . ondansetron (ZOFRAN) tablet 4 mg  4 mg Oral Q6H PRN Eugenie Filler, MD       Or  . ondansetron [facility]) injection 4 mg  4 mg Intravenous Q6H PRN Eugenie Filler, MD      . oxyCODONE (Oxy IR/ROXICODONE) immediate release tablet 5 mg  5 mg Oral Q4H PRN Eugenie Filler, MD      . pantoprazole (PROTONIX) injection 40 mg  40 mg Intravenous Daily Eugenie Filler, MD   40 mg at 03/23/19 0913  . potassium chloride 10 mEq in 100 mL IVPB  10 mEq Intravenous Q1 Hr x 4 Dessa Phi, DO 100 mL/hr at 03/23/19 1150 10 mEq at 03/23/19 1150  . risperiDONE (RISPERDAL) tablet 0.125 mg  0.125 mg Oral BID Eugenie Filler, MD      . rivastigmine (EXELON) capsule 4.5 mg   4.5 mg Oral BID Eugenie Filler, MD      . senna-docusate (Senokot-S) tablet 2 tablet  2 tablet Oral Daily Eugenie Filler, MD      . valproic acid (DEPAKENE) solution 250 mg  250 mg Oral QID Eugenie Filler, MD   250 mg at 03/21/19 1645     Discharge Medications: Please see discharge summary for a list of discharge medications.  Relevant Imaging Results:  Relevant Lab Results:   Additional Information SS# [id number]  Janace Hoard, LCSW

## 2019-03-23 NOTE — Progress Notes (Signed)
SLP Cancellation Note  Patient Details Name: Justin Griffin MRN: [id number] DOB: 1941/06/15   Cancelled treatment:       Reason Eval/Treat Not Completed: Other (comment)(pt continues to hold oral medication with poor intake, palliative on board, will continue efforts)   Macario Golds 03/23/2019, 3:39 PM  Luanna Salk, [facility] SLP [place] Pager [phone] Office [phone]

## 2019-03-24 LAB — CULTURE, BLOOD (ROUTINE X 2)
Culture: NO GROWTH
Culture: NO GROWTH
Special Requests: ADEQUATE
Special Requests: ADEQUATE

## 2019-03-24 NOTE — Discharge Summary (Signed)
Physician Discharge Summary  Justin Griffin YIR:[id number] DOB: May 13, 1941 DOA: 03/19/2019  PCP: [facility] [place] date: 03/19/2019 Discharge date: 03/24/2019  Admitted From: SNF Disposition:  Residential hospice   Discharge Condition: Terminal CODE STATUS: DNR  Diet recommendation: Comfort feed  Brief/Interim Summary: HPI per Dr. Rosamaria Lints note the entire history is obtained from the patient's emergency department chart, emergency department provider.Patient's personal history is limited by dementia and altered mental status.  Justin Griffin a 78 y.o.malewith a known history of dementia, CAD, RA, HLDpresents to the emergency department for evaluation of fever. Patient was in a usual state of health at his nursing home but was sent to ER for evaluation of fever. No known focal symptoms. Otherwise there has been no change in status. Patient has been taking medication as prescribed and there has been no recent change in medication or diet. No recent antibiotics. There has been no recent illness, hospitalizations, travel or sick contacts. Patient received Zosyn/Vanco. Medical admission has been requested for further management ofSepsis, unclear source, hypovolemic hypernatremia.  Patient was given IV cefepime, Flagyl and vancomycin.  No source was identified.  He received IV fluids with improvement in hypernatremia.  Acute kidney injury resolved after IV fluids.  He remained largely nonverbal with very poor oral intake.  Due to his advanced dementia, conversation continued with palliative care and patient's daughters.  Eventually, they decided to transition patient to residential hospice.  Discharge Diagnoses:  Principal Problem:   SIRS without source of infection found  Active Problems:   Essential hypertension, benign   Coronary artery disease   GERD   CORONARY ARTERY BYPASS GRAFT, HX OF   Acute kidney injury ([place]) - resolved     Dehydration - resolved    Acute hypernatremia - resolved    Hypokalemia - resolved    Anemia of chronic disease    Abdominal distention - resolved    Pressure injury of skin, right anterior hand, POA     Adynamic ileus ([place]) - resolved    Dementia without behavioral disturbance ([place])   Goals of care, counseling/discussion   Palliative care by specialist   Hyperlipidemia   Transaminitis - resolved    Acute urinary retention - resolved    Discharge Instructions   Allergies as of 03/24/2019      Reactions   Zocor [simvastatin] Other (See Comments)   Muscle pain      Medication List    STOP taking these medications   aspirin EC 81 MG tablet   citalopram 20 MG tablet Commonly known as: CELEXA   hydrochlorothiazide 25 MG tablet Commonly known as: HYDRODIURIL   latanoprost 0.005 % ophthalmic solution Commonly known as: XALATAN   memantine 10 MG tablet Commonly known as: NAMENDA   Memantine HCl-Donepezil HCl 28-10 MG Cp24 Commonly known as: Namzaric   Mylanta Maximum Strength 400-400-40 MG/5ML suspension Generic drug: alum & mag hydroxide-simeth   pantoprazole 40 MG tablet Commonly known as: PROTONIX   rivastigmine 4.5 MG capsule Commonly known as: EXELON   sennosides-docusate sodium 8.6-50 MG tablet Commonly known as: SENOKOT-S   valproic acid 250 MG/5ML Soln solution Commonly known as: DEPAKENE       Allergies  Allergen Reactions  . Zocor [Simvastatin] Other (See Comments)    Muscle pain    Consultations:  Palliative care medicine   Procedures/Studies: Dg Chest Port 1 View  Result Date: 03/19/2019 CLINICAL DATA:  Fever.  Sepsis.  Coronary artery disease. EXAM: PORTABLE CHEST 1 VIEW  COMPARISON:  03/19/2019 FINDINGS: The heart size and mediastinal contours are within normal limits. Prior CABG again noted. Both lungs are clear. The visualized skeletal structures are unremarkable. IMPRESSION: No active disease. Electronically Signed   By: Earle Gell  M.D.   On: 03/19/2019 07:40   Dg Chest Port 1 View  Result Date: 03/19/2019 CLINICAL DATA:  Fever EXAM: PORTABLE CHEST 1 VIEW COMPARISON:  06/10/2017 FINDINGS: Prior CABG. Heart and mediastinal contours are within normal limits. No focal opacities or effusions. No acute bony abnormality. IMPRESSION: No active disease. Electronically Signed   By: Rolm Baptise M.D.   On: 03/19/2019 02:54   Dg Abd Portable 2v  Result Date: 03/21/2019 CLINICAL DATA:  Ileus. EXAM: PORTABLE ABDOMEN - 2 VIEW COMPARISON:  03/20/2019 FINDINGS: There is persistent extensive air in the large and small bowel with slight distention of the bowel. No visible free air. There is suggestion of moderate stool in the rectum. No significant bone abnormality. IMPRESSION: 1. Persistent ileus. 2. Moderate stool in the rectum. Electronically Signed   By: Lorriane Shire M.D.   On: 03/21/2019 11:27   Dg Abd Portable 2v  Result Date: 03/20/2019 CLINICAL DATA:  Abdominal distention. EXAM: PORTABLE ABDOMEN - 2 VIEW COMPARISON:  CT 06/11/2017. FINDINGS: Soft tissue structures are unremarkable. Dilated loops of small and large bowel noted consistent adynamic ileus. No free air. Lumbar spine osteopenia degenerative change. No acute bony abnormality identified. Prior CABG. IMPRESSION: Dilated loops of small and large bowel noted suggesting adynamic ileus. Bowel obstruction cannot be excluded and follow-up exam to demonstrate resolution of bowel distention suggested. No free air. Electronically Signed   By: Marcello Moores  Register   On: 03/20/2019 10:24      Discharge Exam: Vitals:   03/24/19 0520 03/24/19 1330  BP: (!) 148/87 (!) 131/100  Pulse: 76 68  Resp: 18 18  Temp: 98.1 F (36.7 C) 97.7 F (36.5 C)  SpO2: 100%     General: Pt is alert, awake, not in acute distress, nonverbal, does not interact or answer any questions Cardiovascular: RRR, S1/S2 +, no rubs, no gallops Respiratory: CTA bilaterally, no wheezing, no rhonchi Abdominal: Soft,  NT, ND, bowel sounds + Extremities: no edema, no cyanosis    The results of significant diagnostics from this hospitalization (including imaging, microbiology, ancillary and laboratory) are listed below for reference.     Microbiology: Recent Results (from the past 240 hour(s))  Blood Culture (routine x 2)     Status: None   Collection Time: 03/19/19  1:55 AM   Specimen: BLOOD  Result Value Ref Range Status   Specimen Description   Final    BLOOD RIGHT ANTECUBITAL Performed at [facility] [address], [place], [place] [address]    Special Requests   Final    BOTTLES DRAWN AEROBIC AND ANAEROBIC Blood Culture adequate volume Performed at [facility] [address], [place], [place] [address]    Culture   Final    NO GROWTH 5 DAYS Performed at [facility], [place] [address] [place], [place] [address]    Report Status 03/24/2019 FINAL  Final  SARS Coronavirus 2 (CEPHEID- Performed in [place] hospital lab), Hosp Order     Status: None   Collection Time: 03/19/19  2:05 AM   Specimen: Nasopharyngeal Swab  Result Value Ref Range Status   SARS Coronavirus 2 NEGATIVE NEGATIVE Final    Comment: (NOTE) If result is NEGATIVE SARS-CoV-2 target nucleic acids are NOT  DETECTED. The SARS-CoV-2 RNA is generally detectable in upper and lower  respiratory specimens during the acute phase of infection. The lowest  concentration of SARS-CoV-2 viral copies this assay can detect is 250  copies / mL. A negative result does not preclude SARS-CoV-2 infection  and should not be used as the sole basis for treatment or other  patient management decisions.  A negative result may occur with  improper specimen collection / handling, submission of specimen other  than nasopharyngeal swab, presence of viral mutation(s) within the  areas targeted by this assay, and inadequate number of viral copies  (<250 copies / mL). A negative result must be combined  with clinical  observations, patient history, and epidemiological information. If result is POSITIVE SARS-CoV-2 target nucleic acids are DETECTED. The SARS-CoV-2 RNA is generally detectable in upper and lower  respiratory specimens dur ing the acute phase of infection.  Positive  results are indicative of active infection with SARS-CoV-2.  Clinical  correlation with patient history and other diagnostic information is  necessary to determine patient infection status.  Positive results do  not rule out bacterial infection or co-infection with other viruses. If result is PRESUMPTIVE POSTIVE SARS-CoV-2 nucleic acids MAY BE PRESENT.   A presumptive positive result was obtained on the submitted specimen  and confirmed on repeat testing.  While 2019 novel coronavirus  (SARS-CoV-2) nucleic acids may be present in the submitted sample  additional confirmatory testing may be necessary for epidemiological  and / or clinical management purposes  to differentiate between  SARS-CoV-2 and other Sarbecovirus currently known to infect humans.  If clinically indicated additional testing with an alternate test  methodology [phone]) is advised. The SARS-CoV-2 RNA is generally  detectable in upper and lower respiratory sp ecimens during the acute  phase of infection. The expected result is Negative. Fact Sheet for Patients:  StrictlyIdeas.no Fact Sheet for Healthcare Providers: BankingDealers.co.za This test is not yet approved or cleared by the [place] FDA and has been authorized for detection and/or diagnosis of SARS-CoV-2 by FDA under an Emergency Use Authorization (EUA).  This EUA will remain in effect (meaning this test can be used) for the duration of the COVID-19 declaration under Section 564(b)(1) of the Act, 21 U.S.C. section 360bbb-3(b)(1), unless the authorization is terminated or revoked sooner. Performed at [facility], [place] [address] [place], [place] [address]   Urine culture     Status: None   Collection Time: 03/19/19  4:00 AM   Specimen: Urine, Clean Catch  Result Value Ref Range Status   Specimen Description   Final    URINE, CLEAN CATCH Performed at [facility], [place] [address], [place], [place] [address]    Special Requests   Final    Normal Performed at [facility], [place] [address], [place], [place] [address]    Culture   Final    NO GROWTH Performed at [facility], [place] [address] [place], [place] [address]    Report Status 03/20/2019 FINAL  Final  MRSA PCR Screening     Status: None   Collection Time: 03/19/19  6:45 AM   Specimen: Nasal Mucosa; Nasopharyngeal  Result Value Ref Range Status   MRSA by PCR NEGATIVE NEGATIVE Final    Comment:        The GeneXpert MRSA Assay (FDA approved for NASAL specimens only), is one component of a comprehensive MRSA colonization surveillance program. It is not intended to diagnose  MRSA infection nor to guide or monitor treatment for MRSA infections. Performed at [facility], [place] [address] [place], [place] [address]   Blood Culture (routine x 2)     Status: None   Collection Time: 03/19/19  6:50 AM   Specimen: BLOOD  Result Value Ref Range Status   Specimen Description   Final    BLOOD RIGHT ANTECUBITAL Performed at [facility] [address], [place], [place] [address]    Special Requests   Final    BOTTLES DRAWN AEROBIC ONLY Blood Culture adequate volume Performed at [facility] [address] [place], [place] [address]    Culture   Final    NO GROWTH 5 DAYS Performed at [facility], [place] [address], [place], [place] [address]    Report Status 03/24/2019 FINAL  Final     Labs: BNP (last 3 results) No results for input(s): BNP in the last 8760 hours. Basic Metabolic Panel: Recent Labs  Lab  03/19/19 0535  03/20/19 0301  03/21/19 0236 03/21/19 0957 03/21/19 1811 03/22/19 0559 03/23/19 0553  NA 160*   < > 156*   < > 153* 150* 150* 146* 144  K 3.3*   < > 3.4*   < > 3.6 3.1* 3.5 3.5 3.3*  CL 127*   < > 124*   < > 122* 119* 119* 115* 113*  CO2 26   < > 22   < > 19* 20* 22 22 22   GLUCOSE 159*   < > 163*   < > 142* 134* 150* 140* 97  BUN 36*   < > 36*   < > 26* 21 18 11  7*  CREATININE 1.22   < > 1.17   < > 0.96 0.84 0.79 0.77 0.62  CALCIUM 8.5*   < > 8.3*   < > 8.6* 8.2* 8.3* 8.2* 8.2*  MG 2.5*  --  2.4  --   --   --   --   --  2.0  PHOS 4.1  --   --   --   --   --   --   --   --    < > = values in this interval not displayed.   Liver Function Tests: Recent Labs  Lab 03/19/19 0155 03/20/19 0301 03/21/19 0236  AST 99* 48* 30  ALT 80* 52* 36  ALKPHOS 65 49 44  BILITOT 1.0 0.5 0.5  PROT 8.5* 6.7 6.5  ALBUMIN 3.0* 2.5* 2.4*   No results for input(s): LIPASE, AMYLASE in the last 168 hours. Recent Labs  Lab 03/20/19 1002  AMMONIA 30   CBC: Recent Labs  Lab 03/19/19 0155  03/19/19 0645 03/20/19 0301 03/21/19 0236 03/22/19 0559 03/23/19 0553  WBC 9.8   < > 10.5 9.6 8.2 6.4 6.5  NEUTROABS 6.9  --  7.2  --  5.6 4.2  --   HGB 11.5*   < > 10.2* 9.0* 8.4* 7.5* 8.1*  HCT 41.6   < > 37.3* 34.1* 32.4* 28.0* 29.4*  MCV 73.2*   < > 74.7* 74.6* 78.8* 75.3* 74.2*  PLT 326   < > 250 216 PLATELET CLUMPS NOTED ON SMEAR, UNABLE TO ESTIMATE 223 248   < > = values in this interval not displayed.   Cardiac Enzymes: No results for input(s): CKTOTAL, CKMB, CKMBINDEX, TROPONINI in the last 168 hours. BNP: Invalid input(s): POCBNP CBG: Recent Labs  Lab 03/20/19 1922  GLUCAP 132*  D-Dimer No results for input(s): DDIMER in the last 72 hours. Hgb A1c No results for input(s): HGBA1C in the last 72 hours. Lipid Profile No results for input(s): CHOL, HDL, LDLCALC, TRIG, CHOLHDL, LDLDIRECT in the last 72 hours. Thyroid function studies No results for input(s): TSH,  T4TOTAL, T3FREE, THYROIDAB in the last 72 hours.  Invalid input(s): FREET3 Anemia work up No results for input(s): VITAMINB12, FOLATE, FERRITIN, TIBC, IRON, RETICCTPCT in the last 72 hours. Urinalysis    Component Value Date/Time   COLORURINE AMBER (A) 03/19/2019 0400   APPEARANCEUR HAZY (A) 03/19/2019 0400   LABSPEC 1.029 03/19/2019 0400   PHURINE 5.0 03/19/2019 0400   GLUCOSEU NEGATIVE 03/19/2019 0400   HGBUR NEGATIVE 03/19/2019 0400   BILIRUBINUR SMALL (A) 03/19/2019 0400   KETONESUR NEGATIVE 03/19/2019 0400   PROTEINUR 100 (A) 03/19/2019 0400   NITRITE NEGATIVE 03/19/2019 0400   LEUKOCYTESUR NEGATIVE 03/19/2019 0400   Sepsis Labs Invalid input(s): PROCALCITONIN,  WBC,  LACTICIDVEN Microbiology Recent Results (from the past 240 hour(s))  Blood Culture (routine x 2)     Status: None   Collection Time: 03/19/19  1:55 AM   Specimen: BLOOD  Result Value Ref Range Status   Specimen Description   Final    BLOOD RIGHT ANTECUBITAL Performed at [facility], [place] [address], [place], [place] [address]    Special Requests   Final    BOTTLES DRAWN AEROBIC AND ANAEROBIC Blood Culture adequate volume Performed at [facility] [address], [place], [place] [address]    Culture   Final    NO GROWTH 5 DAYS Performed at [facility], [place] [address] [place], [place] [address]    Report Status 03/24/2019 FINAL  Final  SARS Coronavirus 2 (CEPHEID- Performed in [facility] hospital lab), Hosp Order     Status: None   Collection Time: 03/19/19  2:05 AM   Specimen: Nasopharyngeal Swab  Result Value Ref Range Status   SARS Coronavirus 2 NEGATIVE NEGATIVE Final    Comment: (NOTE) If result is NEGATIVE SARS-CoV-2 target nucleic acids are NOT DETECTED. The SARS-CoV-2 RNA is generally detectable in upper and lower  respiratory specimens during the acute phase of infection. The lowest  concentration of SARS-CoV-2 viral copies this assay can  detect is 250  copies / mL. A negative result does not preclude SARS-CoV-2 infection  and should not be used as the sole basis for treatment or other  patient management decisions.  A negative result may occur with  improper specimen collection / handling, submission of specimen other  than nasopharyngeal swab, presence of viral mutation(s) within the  areas targeted by this assay, and inadequate number of viral copies  (<250 copies / mL). A negative result must be combined with clinical  observations, patient history, and epidemiological information. If result is POSITIVE SARS-CoV-2 target nucleic acids are DETECTED. The SARS-CoV-2 RNA is generally detectable in upper and lower  respiratory specimens dur ing the acute phase of infection.  Positive  results are indicative of active infection with SARS-CoV-2.  Clinical  correlation with patient history and other diagnostic information is  necessary to determine patient infection status.  Positive results do  not rule out bacterial infection or co-infection with other viruses. If result is PRESUMPTIVE POSTIVE SARS-CoV-2 nucleic acids MAY BE PRESENT.   A presumptive positive result was obtained on the submitted specimen  and confirmed on repeat testing.  While 2019 novel coronavirus  (SARS-CoV-2) nucleic acids may be  present in the submitted sample  additional confirmatory testing may be necessary for epidemiological  and / or clinical management purposes  to differentiate between  SARS-CoV-2 and other Sarbecovirus currently known to infect humans.  If clinically indicated additional testing with an alternate test  methodology [phone]) is advised. The SARS-CoV-2 RNA is generally  detectable in upper and lower respiratory sp ecimens during the acute  phase of infection. The expected result is Negative. Fact Sheet for Patients:  StrictlyIdeas.no Fact Sheet for Healthcare  Providers: BankingDealers.co.za This test is not yet approved or cleared by the [place] FDA and has been authorized for detection and/or diagnosis of SARS-CoV-2 by FDA under an Emergency Use Authorization (EUA).  This EUA will remain in effect (meaning this test can be used) for the duration of the COVID-19 declaration under Section 564(b)(1) of the Act, 21 U.S.C. section 360bbb-3(b)(1), unless the authorization is terminated or revoked sooner. Performed at [facility], [place] [address], [place], [place] [address]   Urine culture     Status: None   Collection Time: 03/19/19  4:00 AM   Specimen: Urine, Clean Catch  Result Value Ref Range Status   Specimen Description   Final    URINE, CLEAN CATCH Performed at [facility], [place] [address] [place], [place] [address]    Special Requests   Final    Normal Performed at [facility], [place] [address] [place], [place] [address]    Culture   Final    NO GROWTH Performed at [facility], [place] [address], [place], [place] [address]    Report Status 03/20/2019 FINAL  Final  MRSA PCR Screening     Status: None   Collection Time: 03/19/19  6:45 AM   Specimen: Nasal Mucosa; Nasopharyngeal  Result Value Ref Range Status   MRSA by PCR NEGATIVE NEGATIVE Final    Comment:        The GeneXpert MRSA Assay (FDA approved for NASAL specimens only), is one component of a comprehensive MRSA colonization surveillance program. It is not intended to diagnose MRSA infection nor to guide or monitor treatment for MRSA infections. Performed at [facility], [place] [address], [place], [place] [address]   Blood Culture (routine x 2)     Status: None   Collection Time: 03/19/19  6:50 AM   Specimen: BLOOD  Result Value Ref Range Status   Specimen Description   Final    BLOOD RIGHT ANTECUBITAL Performed at [facility]  [address] [place], [place] [address]    Special Requests   Final    BOTTLES DRAWN AEROBIC ONLY Blood Culture adequate volume Performed at [facility] [address], [place], [place] [address]    Culture   Final    NO GROWTH 5 DAYS Performed at [facility], [place] [address] [place], [place] [address]    Report Status 03/24/2019 FINAL  Final     Patient was seen and examined on the day of discharge and was found to be in stable condition. Time coordinating discharge: 35 minutes including assessment and coordination of care, as well as examination of the patient.   SIGNED:  Dessa Phi, DO [facility] [email or web address] 03/24/2019, 1:33 PM

## 2019-03-24 NOTE — TOC Progression Note (Signed)
Transition of Care [facility]) - Progression Note    Patient Details  Name: NADAV SWINDELL MRN: [id number] Date of Birth: [birth year]  [facility] CM/SW Contact  Servando Snare, [place] Phone Number: 03/24/2019, 10:51 AM  Clinical Narrative:   Family and patient has agreed upon [facility] place for patient. LCSW notified [facility] and is awaiting bed availability.      Expected Discharge Plan: Skilled Nursing Facility Barriers to Discharge: Continued Medical Work up  Expected Discharge Plan and Services Expected Discharge Plan: [place]   Discharge Planning Services: CM Consult   Living arrangements for the past 2 months: [place] Expected Discharge Date: (unknown)               DME Arranged: N/A DME Agency: NA       HH Arranged: NA HH Agency: NA         Social Determinants of Health (SDOH) Interventions    Readmission Risk Interventions Readmission Risk Prevention Plan 03/20/2019  Transportation Screening Complete  PCP or Specialist Appt within 3-5 Days Complete  HRI or [place] Complete  Social Work Consult for [place] Planning/Counseling Complete  Palliative Care Screening Not Applicable  Medication Review Press photographer) Complete  Some recent data might be hidden

## 2019-03-24 NOTE — Progress Notes (Addendum)
Report given to stephanie, rn at [facility]. Patient will be transferred to [facility] via [facility]. IV and foley left in place  Per Dr. Montine Circle.

## 2019-03-24 NOTE — TOC Transition Note (Signed)
Transition of Care [facility]) - CM/SW Discharge Note   Patient Details  Name: Justin Griffin MRN: [id number] Date of Birth: [birth year]  [facility] CM/SW Contact:  Servando Snare, LCSW Phone Number: 03/24/2019, 2:11 PM   Clinical Narrative:   Patient to dc to [facility]. Dc docs faxed to facility.   RN please call report to [phone].    Final next level of care: [place] Barriers to Discharge: No Barriers Identified   Patient Goals and CMS Choice        Discharge Placement              Patient [facility] bed at: [facility]) Patient to be transferred to facility by: EMS Name of family member notified: [name] Patient and family notified of of transfer: 03/24/19  Discharge Plan and Services   Discharge Planning Services: CM Consult            DME Arranged: N/A DME Agency: NA       HH Arranged: NA [place] Agency: NA        Social Determinants of Health (SDOH) Interventions     Readmission Risk Interventions Readmission Risk Prevention Plan 03/20/2019  Transportation Screening Complete  PCP or Specialist Appt within 3-5 Days Complete  HRI or [place] Complete  Social Work Consult for [place] Planning/Counseling Complete  Palliative Care Screening Not Applicable  Medication Review Press photographer) Complete  Some recent data might be hidden

## 2019-03-24 NOTE — Progress Notes (Signed)
Manufacturing engineer [facility]) Hospital Liaison note.   Received request from [place], [place] for family interest in [facility] with request for transfer today. Chart reviewed and eligibility confirmed. Spoke with daughter, Lawerance Bach to confirm interest and explain services. Family agreeable to transfer. CSW aware.  Registration paper work completed. Dr. Orpah Melter (or whoever) to assume care per family request.   Please fax discharge summary to [phone]. RN please call report to [phone]. Please arrange transport for patient.    Thank you,      Farrel Gordon, RN, [place] Liaison   [phone]

## 2019-03-24 NOTE — Progress Notes (Signed)
  Speech Language Pathology Treatment: Dysphagia  Patient Details Name: [facility] MRN: [id number] DOB: [birth year] Today's Date: 03/24/2019 Time: 1430-1450 SLP Time Calculation (min) (ACUTE ONLY): 20 min  Assessment / Plan / Recommendation Clinical Impression  Patient accepted thin liquids (water) PO's from spoon readily and swallow initiation was in range of 2-10 seconds. He did not exhibit any overt s/s of aspiration or penetration and did not exhibit any holding of boluses or oral residuals after PO intake. RN came into room while SLP was giving patient PO's and she said that he ate meals well today and did not exhibit significant swallow initiation delays. Plan is for him to discharge today to [facility] with hospice care    HPI HPI: pt is a 78 yo male adm to [facility] with AMS, diagnosed with sepsis.  Pt has h/o severe esophageal dysmotility, dementia, CAD, RA, HLD.  Pt is on Cefepime, Vanco, Flagyl.  Swallow eval ordered.  CXR negative.  Abd imaging concerning for possible adynamic ileus.  Per SNF notes, pt on puree/thin diet prior to admit.  MOST form indicates no feeding tube, limited interventions.  Pt underwent BSE yesterday but did not start on po diet due to his bowel issue.  Today follow up indicated to assure diet recommended yesterday is still appropriate.  Pt was started on clear liquid diet this am.  Bowel sounds are present.      SLP Plan  Discharge SLP treatment due to (comment)(patient discharging to hospice facility today)       Recommendations  Diet recommendations: Thin liquid Liquids provided via: Cup;Teaspoon;Straw Medication Administration: Crushed with puree Supervision: Full supervision/cueing for compensatory strategies Compensations: Slow rate;Small sips/bites;Other (Comment) Postural Changes and/or Swallow Maneuvers: Seated upright 90 degrees;Upright 30-60 min after meal                Oral Care Recommendations: Oral care QID SLP Visit Diagnosis:  Dysphagia, oral phase (R13.11);Dysphagia, unspecified (R13.10) Plan: Discharge SLP treatment due to (comment)(patient discharging to hospice facility today)       GO                Dannial Monarch 03/24/2019, 3:00 PM     Sonia Baller, MA, [facility] Pager: [phone]

## 2019-04-04 ENCOUNTER — Telehealth: Payer: Self-pay | Admitting: *Deleted

## 2019-04-04 NOTE — Telephone Encounter (Signed)
Per Mr.Nell's daughter,she stated her father is in the pace program.

## 2019-05-02 ENCOUNTER — Other Ambulatory Visit: Payer: Self-pay

## 2019-05-02 ENCOUNTER — Emergency Department (HOSPITAL_COMMUNITY): Payer: Medicare (Managed Care)

## 2019-05-02 ENCOUNTER — Emergency Department (HOSPITAL_COMMUNITY)
Admission: EM | Admit: 2019-05-02 | Discharge: 2019-05-02 | Disposition: A | Discharge status: Home / Self Care | Payer: Medicare (Managed Care) | Attending: Emergency Medicine | Admitting: Emergency Medicine

## 2019-05-02 DIAGNOSIS — Z951 Presence of aortocoronary bypass graft: Secondary | ICD-10-CM | POA: Diagnosis not present

## 2019-05-02 DIAGNOSIS — I10 Essential (primary) hypertension: Secondary | ICD-10-CM | POA: Insufficient documentation

## 2019-05-02 DIAGNOSIS — Z87891 Personal history of nicotine dependence: Secondary | ICD-10-CM | POA: Diagnosis not present

## 2019-05-02 DIAGNOSIS — Z7982 Long term (current) use of aspirin: Secondary | ICD-10-CM | POA: Insufficient documentation

## 2019-05-02 DIAGNOSIS — R0902 Hypoxemia: Secondary | ICD-10-CM | POA: Diagnosis present

## 2019-05-02 DIAGNOSIS — I259 Chronic ischemic heart disease, unspecified: Secondary | ICD-10-CM | POA: Diagnosis not present

## 2019-05-02 DIAGNOSIS — G309 Alzheimer's disease, unspecified: Secondary | ICD-10-CM | POA: Insufficient documentation

## 2019-05-02 DIAGNOSIS — R942 Abnormal results of pulmonary function studies: Secondary | ICD-10-CM | POA: Diagnosis not present

## 2019-05-02 DIAGNOSIS — Z20828 Contact with and (suspected) exposure to other viral communicable diseases: Secondary | ICD-10-CM | POA: Insufficient documentation

## 2019-05-02 DIAGNOSIS — R7981 Abnormal blood-gas level: Secondary | ICD-10-CM

## 2019-05-02 LAB — BASIC METABOLIC PANEL
Anion gap: 9 (ref 5–15)
BUN: 6 mg/dL — ABNORMAL LOW (ref 8–23)
CO2: 25 mmol/L (ref 22–32)
Calcium: 9 mg/dL (ref 8.9–10.3)
Chloride: 103 mmol/L (ref 98–111)
Creatinine, Ser: 0.86 mg/dL (ref 0.61–1.24)
GFR calc Af Amer: >60 mL/min (ref >60)
GFR calc non Af Amer: >60 mL/min (ref >60)
Glucose, Bld: 97 mg/dL (ref 70–99)
Potassium: 4.1 mmol/L (ref 3.5–5.1)
Sodium: 137 mmol/L (ref 135–145)

## 2019-05-02 LAB — CBC WITH DIFFERENTIAL/PLATELET
Abs Immature Granulocytes: 0.03 10*3/uL (ref 0.00–0.07)
Basophils Absolute: 0.1 10*3/uL (ref 0.0–0.1)
Basophils Relative: 1 %
Eosinophils Absolute: 0.1 10*3/uL (ref 0.0–0.5)
Eosinophils Relative: 2 %
HCT: 32.1 % — ABNORMAL LOW (ref 39.0–52.0)
Hemoglobin: 9.4 g/dL — ABNORMAL LOW (ref 13.0–17.0)
Immature Granulocytes: 0 %
Lymphocytes Relative: 32 %
Lymphs Abs: 2.2 10*3/uL (ref 0.7–4.0)
MCH: 20.8 pg — ABNORMAL LOW (ref 26.0–34.0)
MCHC: 29.3 g/dL — ABNORMAL LOW (ref 30.0–36.0)
MCV: 70.9 fL — ABNORMAL LOW (ref 80.0–100.0)
Monocytes Absolute: 0.9 10*3/uL (ref 0.1–1.0)
Monocytes Relative: 14 %
Neutro Abs: 3.4 10*3/uL (ref 1.7–7.7)
Neutrophils Relative %: 51 %
Platelets: 383 10*3/uL (ref 150–400)
RBC: 4.53 MIL/uL (ref 4.22–5.81)
RDW: 20.5 % — ABNORMAL HIGH (ref 11.5–15.5)
WBC: 6.7 10*3/uL (ref 4.0–10.5)
nRBC: 0 % (ref 0.0–0.2)

## 2019-05-02 LAB — SARS CORONAVIRUS 2 BY RT PCR (HOSPITAL ORDER, PERFORMED IN ~~LOC~~ HOSPITAL LAB): SARS Coronavirus 2: NEGATIVE

## 2019-05-02 NOTE — ED Triage Notes (Signed)
Pt BIB [facility] from [facility]. Pt has a pmh of dementia and is nonverbal at baseline with some incomprehensible speech. Per EMS staff reports that patient has had decreased spo2 at the facility over the past few days. Reported to EMS that it was 78% on room air today. EMS reports patient was 97% on room air upon their arrival.

## 2019-05-02 NOTE — ED Provider Notes (Signed)
[facility] Provider Note   CSN: [id number] Arrival date & time: 05/02/19  1751    History   Chief Complaint Chief Complaint  Patient presents with  . Low SpO2    HPI Justin Griffin is a 78 y.o. male.     HPI   Level 5 caveat due to dementia.  Patient has active DNR.  Justin [facility] is a 78 y.o. male, with a history of Alzheimer's dementia, presenting to the ED with reported decrease in SPO2 over the last couple days. Per EMS, facility reported SPO2 of 78% on room air today.  SPO2 of 97% on room air with EMS.  Patient is reportedly nonverbal at baseline due to dementia.    Past Medical History:  Diagnosis Date  . Alzheimer's disease ([place])   . CAD (coronary artery disease)    Catheterization 2006 LAD occluded, OM 95% stenosis followed by mid occlusion, first obtuse marginal occluded, right coronary artery diffuse moderate disease. LIMA to the LAD was patent. Saphenous vein to PDA and posterior lateral patent with diffuse luminal irregularities, saphenous vein graft to second obtuse marginal is patent, saphenous vein graft to the first obtuse marginal had 25% stenosis.  . Esophageal reflux   . Esophageal stricture   . Other and unspecified hyperlipidemia   . Other dysphagia   . Rheumatoid arthritis(714.0)   . Status post dilation of esophageal narrowing   . Unspecified essential hypertension     Patient Active Problem List   Diagnosis Date Noted  . Adynamic ileus ([place]) 03/21/2019  . Dementia without behavioral disturbance ([place])   . Goals of care, counseling/discussion   . Palliative care by specialist   . Pressure injury of skin 03/20/2019  . Abdominal distention   . Sepsis ([place]) 03/19/2019  . Acute hypernatremia 03/19/2019  . Hypokalemia 03/19/2019  . Anemia 03/19/2019  . Dehydration   . Delirium 06/11/2017  . Acute kidney injury ([place]) 06/11/2017  . Fever 06/11/2017  . Effusion of right knee 06/11/2017  . Inguinal  hernia 12/24/2016  . Testicular pain, left 08/07/2016  . Hemorrhoid 08/07/2016  . 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[place]) 07/24/2016  . Routine general medical examination at a health care facility 01/27/2016  . Medicare annual wellness visit, subsequent 01/27/2016  . Essential hypertension, benign 08/08/2009  . GERD 07/04/2008  . RHEUMATOID ARTHRITIS 07/04/2008  . WEIGHT LOSS-ABNORMAL 07/04/2008  . DYSPHAGIA 07/04/2008  . TOBACCO USER 07/03/2008  . POLYP, COLON 02/04/2008  . DYSLIPIDEMIA 02/04/2008  . Essential hypertension 02/04/2008  . Coronary artery disease 02/04/2008  . POLYPECTOMY, HX OF 02/04/2008  . CORONARY ARTERY BYPASS GRAFT, HX OF 02/04/2008    Past Surgical History:  Procedure Laterality Date  . CATARACT EXTRACTION, BILATERAL    . CORONARY ARTERY BYPASS GRAFT  1996  . INGUINAL HERNIA REPAIR Right 04/05/2017   Procedure: OPEN RIGHT INGUINAL HERNIA REPAIR;  Surgeon: Alphonsa Overall, MD;  Location: [facility];  Service: General;  Laterality: Right;  . INSERTION OF MESH Right 04/05/2017   Procedure: INSERTION OF MESH;  Surgeon: Alphonsa Overall, MD;  Location: [facility];  Service: General;  Laterality: Right;  . left digit amputation          Home Medications    Prior to Admission medications   Medication Sig Start Date End Date Taking? Authorizing Provider  aspirin EC 81 MG tablet Take 81 mg by mouth daily.   Yes [provider]  latanoprost (XALATAN) 0.005 % ophthalmic solution  Place 1 drop into both eyes at bedtime.   Yes [provider]  NON FORMULARY Take 1 Can by mouth See admin instructions. Mighty Shake: Drink 1 can by mouth three times a day- 8:30 AM/11:30 AM/6 PM   Yes [provider]  pantoprazole (PROTONIX) 40 MG tablet Take 40 mg by mouth daily before breakfast.   Yes [provider]  senna (SENOKOT) 8.6 MG TABS tablet Take 2 tablets by mouth at bedtime.   Yes [provider]    Family History  Family History  Problem Relation Age of Onset  . Heart disease Maternal Grandfather   . Prostate cancer Paternal Grandfather   . Heart disease Mother   . Colon cancer Neg Hx   . Esophageal cancer Neg Hx   . Stomach cancer Neg Hx   . Pancreatic cancer Neg Hx   . Liver disease Neg Hx     Social History Social History   Tobacco Use  . Smoking status: Former Smoker    Years: 0.00    Types: Cigarettes    Quit date: 10/05/1994    Years since quitting: 24.5  . Smokeless tobacco: Never Used  Substance Use Topics  . Alcohol use: No  . Drug use: No     Allergies   Ace inhibitors, Lisinopril, and Zocor [simvastatin]   Review of Systems Review of Systems  Unable to perform ROS: Dementia     Physical Exam Updated Vital Signs BP (!) 163/93   Pulse 85   Temp 98.1 F (36.7 C) (Oral)   Resp 12   SpO2 100%   Physical Exam Vitals signs and nursing note reviewed.  Constitutional:      General: He is not in acute distress.    Appearance: He is well-developed. He is not diaphoretic.  HENT:     Head: Normocephalic and atraumatic.     Mouth/Throat:     Mouth: Mucous membranes are moist.     Pharynx: Oropharynx is clear.  Eyes:     Conjunctiva/sclera: Conjunctivae normal.  Neck:     Musculoskeletal: Neck supple.  Cardiovascular:     Rate and Rhythm: Normal rate and regular rhythm.     Pulses: Normal pulses.          Radial pulses are 2+ on the right side and 2+ on the left side.       Posterior tibial pulses are 2+ on the right side and 2+ on the left side.     Heart sounds: Normal heart sounds.     Comments: Tactile temperature in the extremities appropriate and equal bilaterally. Pulmonary:     Effort: Pulmonary effort is normal. No respiratory distress.     Breath sounds: Normal breath sounds.     Comments: No increased work of breathing. Abdominal:     Palpations: Abdomen is soft.     Tenderness: There is no abdominal tenderness. There is no guarding.   Musculoskeletal:     Right lower leg: No edema.     Left lower leg: No edema.  Lymphadenopathy:     Cervical: No cervical adenopathy.  Skin:    General: Skin is warm and dry.  Neurological:     Comments: Nonverbal per his reported baseline  Psychiatric:        Mood and Affect: Mood and affect normal.        Speech: Speech normal.        Behavior: Behavior normal.      ED Treatments /  Results  Labs (all labs ordered are listed, but only abnormal results are displayed) Labs Reviewed  BASIC METABOLIC PANEL - Abnormal; Notable for the following components:      Result Value   BUN 6 (*)    All other components within normal limits  CBC WITH DIFFERENTIAL/PLATELET - Abnormal; Notable for the following components:   Hemoglobin 9.4 (*)    HCT 32.1 (*)    MCV 70.9 (*)    MCH 20.8 (*)    MCHC 29.3 (*)    RDW 20.5 (*)    All other components within normal limits  SARS CORONAVIRUS 2 (HOSPITAL ORDER, [facility])   Hemoglobin  Date Value Ref Range Status  05/02/2019 9.4 (L) 13.0 - 17.0 g/dL Final  03/23/2019 8.1 (L) 13.0 - 17.0 g/dL Final    Comment:    Reticulocyte Hemoglobin testing may be clinically indicated, consider ordering this additional test [id number]   03/22/2019 7.5 (L) 13.0 - 17.0 g/dL Final    Comment:    Reticulocyte Hemoglobin testing may be clinically indicated, consider ordering this additional test [id number]   03/21/2019 8.4 (L) 13.0 - 17.0 g/dL Final    EKG None  Radiology Dg Chest Portable 1 View  Result Date: 05/02/2019 CLINICAL DATA:  Low O2 saturation, shortness of breath EXAM: PORTABLE CHEST 1 VIEW COMPARISON:  March 19, 2019 FINDINGS: The heart size and mediastinal contours are stable. Patient status post prior CABG. Both lungs are clear. The visualized skeletal structures are unremarkable. IMPRESSION: No active cardiopulmonary disease. Electronically Signed   By: Abelardo Diesel M.D.   On: 05/02/2019 18:52     Procedures Procedures (including critical care time)  Medications Ordered in ED Medications - No data to display   Initial Impression / Assessment and Plan / ED Course  I have reviewed the triage vital signs and the nursing notes.  Pertinent labs & imaging results that were available during my care of the patient were reviewed by me and considered in my medical decision making (see chart for details).  Clinical Course as of May 01 2225  Tue May 02, 2019  1829 SPO2 100% on room air   [SJ]    Clinical Course User Index [SJ] Lorayne Bender, [place]       Patient presents with report of hypoxia at the nursing facility.  Excellent room air SPO2 readings with good waveform by EMS and here in the ED. Anemia previously noted, improved today from previous values. No acute abnormalities on chest x-ray.  Other lab work also reassuring.  Findings and plan of care discussed with Pattricia Boss, MD. Dr. Jeanell Sparrow personally evaluated and examined this patient.  Vitals:   05/02/19 2100 05/02/19 2115 05/02/19 2130 05/02/19 2145  BP: (!) 182/99 (!) 149/97 (!) 171/101 (!) 163/93  Pulse: 84 83 82 85  Resp: 13 14 16 12   Temp:      TempSrc:      SpO2: 100% 100% 100% 100%      Final Clinical Impressions(s) / ED Diagnoses   Final diagnoses:  Abnormal pulse oximetry    ED Discharge Orders    None       Layla Maw 05/02/19 2228    Pattricia Boss, MD 05/04/19 1511

## 2019-05-02 NOTE — Discharge Instructions (Signed)
Lab work, chest x-ray, and SPO2 readings were without acute abnormalities.  Follow-up with your primary care provider for any further management.

## 2020-01-14 ENCOUNTER — Other Ambulatory Visit: Payer: Self-pay

## 2020-01-14 ENCOUNTER — Emergency Department (HOSPITAL_COMMUNITY): Payer: Medicare (Managed Care)

## 2020-01-14 ENCOUNTER — Encounter (HOSPITAL_COMMUNITY): Payer: Self-pay | Admitting: Emergency Medicine

## 2020-01-14 ENCOUNTER — Inpatient Hospital Stay (HOSPITAL_COMMUNITY)
Admission: EM | Admit: 2020-01-14 | Discharge: 2020-01-20 | DRG: 390 | Disposition: A | Discharge status: Skilled Nursing Facility | Payer: Medicare (Managed Care) | Source: Skilled Nursing Facility | Attending: Family Medicine | Admitting: Family Medicine

## 2020-01-14 DIAGNOSIS — M069 Rheumatoid arthritis, unspecified: Secondary | ICD-10-CM | POA: Diagnosis present

## 2020-01-14 DIAGNOSIS — E876 Hypokalemia: Secondary | ICD-10-CM | POA: Diagnosis present

## 2020-01-14 DIAGNOSIS — K5981 Ogilvie syndrome: Secondary | ICD-10-CM

## 2020-01-14 DIAGNOSIS — Z79899 Other long term (current) drug therapy: Secondary | ICD-10-CM

## 2020-01-14 DIAGNOSIS — M1990 Unspecified osteoarthritis, unspecified site: Secondary | ICD-10-CM | POA: Diagnosis present

## 2020-01-14 DIAGNOSIS — Z8249 Family history of ischemic heart disease and other diseases of the circulatory system: Secondary | ICD-10-CM

## 2020-01-14 DIAGNOSIS — G309 Alzheimer's disease, unspecified: Secondary | ICD-10-CM | POA: Diagnosis present

## 2020-01-14 DIAGNOSIS — R140 Abdominal distension (gaseous): Secondary | ICD-10-CM | POA: Diagnosis not present

## 2020-01-14 DIAGNOSIS — E785 Hyperlipidemia, unspecified: Secondary | ICD-10-CM | POA: Diagnosis present

## 2020-01-14 DIAGNOSIS — K567 Ileus, unspecified: Secondary | ICD-10-CM | POA: Diagnosis not present

## 2020-01-14 DIAGNOSIS — I2510 Atherosclerotic heart disease of native coronary artery without angina pectoris: Secondary | ICD-10-CM | POA: Diagnosis present

## 2020-01-14 DIAGNOSIS — D509 Iron deficiency anemia, unspecified: Secondary | ICD-10-CM | POA: Diagnosis present

## 2020-01-14 DIAGNOSIS — K2100 Gastro-esophageal reflux disease with esophagitis, without bleeding: Secondary | ICD-10-CM | POA: Diagnosis present

## 2020-01-14 DIAGNOSIS — I10 Essential (primary) hypertension: Secondary | ICD-10-CM | POA: Diagnosis present

## 2020-01-14 DIAGNOSIS — Z66 Do not resuscitate: Secondary | ICD-10-CM | POA: Diagnosis present

## 2020-01-14 DIAGNOSIS — Z87891 Personal history of nicotine dependence: Secondary | ICD-10-CM

## 2020-01-14 DIAGNOSIS — Z7401 Bed confinement status: Secondary | ICD-10-CM

## 2020-01-14 DIAGNOSIS — F0390 Unspecified dementia without behavioral disturbance: Secondary | ICD-10-CM | POA: Diagnosis present

## 2020-01-14 DIAGNOSIS — Z7982 Long term (current) use of aspirin: Secondary | ICD-10-CM

## 2020-01-14 DIAGNOSIS — Z09 Encounter for follow-up examination after completed treatment for conditions other than malignant neoplasm: Secondary | ICD-10-CM

## 2020-01-14 DIAGNOSIS — F0280 Dementia in other diseases classified elsewhere without behavioral disturbance: Secondary | ICD-10-CM | POA: Diagnosis present

## 2020-01-14 DIAGNOSIS — Z515 Encounter for palliative care: Secondary | ICD-10-CM | POA: Diagnosis present

## 2020-01-14 DIAGNOSIS — Z20822 Contact with and (suspected) exposure to covid-19: Secondary | ICD-10-CM | POA: Diagnosis present

## 2020-01-14 DIAGNOSIS — Z8042 Family history of malignant neoplasm of prostate: Secondary | ICD-10-CM

## 2020-01-14 DIAGNOSIS — Z951 Presence of aortocoronary bypass graft: Secondary | ICD-10-CM

## 2020-01-14 LAB — GLUCOSE, CAPILLARY
Glucose-Capillary: 69 mg/dL — ABNORMAL LOW (ref 70–99)
Glucose-Capillary: 74 mg/dL (ref 70–99)

## 2020-01-14 LAB — COMPREHENSIVE METABOLIC PANEL
ALT: 7 U/L (ref 0–44)
AST: 20 U/L (ref 15–41)
Albumin: 3.8 g/dL (ref 3.5–5.0)
Alkaline Phosphatase: 62 U/L (ref 38–126)
Anion gap: 9 (ref 5–15)
BUN: 11 mg/dL (ref 8–23)
CO2: 28 mmol/L (ref 22–32)
Calcium: 9 mg/dL (ref 8.9–10.3)
Chloride: 107 mmol/L (ref 98–111)
Creatinine, Ser: 0.98 mg/dL (ref 0.61–1.24)
GFR calc Af Amer: >60 mL/min (ref >60)
GFR calc non Af Amer: >60 mL/min (ref >60)
Glucose, Bld: 96 mg/dL (ref 70–99)
Potassium: 2.8 mmol/L — ABNORMAL LOW (ref 3.5–5.1)
Sodium: 144 mmol/L (ref 135–145)
Total Bilirubin: 0.7 mg/dL (ref 0.3–1.2)
Total Protein: 8.1 g/dL (ref 6.5–8.1)

## 2020-01-14 LAB — URINALYSIS, ROUTINE W REFLEX MICROSCOPIC
Bilirubin Urine: NEGATIVE
Glucose, UA: NEGATIVE mg/dL
Hgb urine dipstick: NEGATIVE
Ketones, ur: NEGATIVE mg/dL
Leukocytes,Ua: NEGATIVE
Nitrite: NEGATIVE
Protein, ur: NEGATIVE mg/dL
Specific Gravity, Urine: 1.02 (ref 1.005–1.030)
pH: 7 (ref 5.0–8.0)

## 2020-01-14 LAB — CBC
HCT: 32.8 % — ABNORMAL LOW (ref 39.0–52.0)
Hemoglobin: 9.8 g/dL — ABNORMAL LOW (ref 13.0–17.0)
MCH: 20.8 pg — ABNORMAL LOW (ref 26.0–34.0)
MCHC: 29.9 g/dL — ABNORMAL LOW (ref 30.0–36.0)
MCV: 69.5 fL — ABNORMAL LOW (ref 80.0–100.0)
Platelets: 302 10*3/uL (ref 150–400)
RBC: 4.72 MIL/uL (ref 4.22–5.81)
RDW: 21.2 % — ABNORMAL HIGH (ref 11.5–15.5)
WBC: 6.6 10*3/uL (ref 4.0–10.5)
nRBC: 0 % (ref 0.0–0.2)

## 2020-01-14 LAB — BASIC METABOLIC PANEL
Anion gap: 7 (ref 5–15)
Anion gap: 8 (ref 5–15)
BUN: 7 mg/dL — ABNORMAL LOW (ref 8–23)
BUN: 7 mg/dL — ABNORMAL LOW (ref 8–23)
CO2: 29 mmol/L (ref 22–32)
CO2: 27 mmol/L (ref 22–32)
Calcium: 8.4 mg/dL — ABNORMAL LOW (ref 8.9–10.3)
Calcium: 8.4 mg/dL — ABNORMAL LOW (ref 8.9–10.3)
Chloride: 107 mmol/L (ref 98–111)
Chloride: 108 mmol/L (ref 98–111)
Creatinine, Ser: 0.78 mg/dL (ref 0.61–1.24)
Creatinine, Ser: 0.7 mg/dL (ref 0.61–1.24)
GFR calc Af Amer: >60 mL/min (ref >60)
GFR calc Af Amer: >60 mL/min (ref >60)
GFR calc non Af Amer: >60 mL/min (ref >60)
GFR calc non Af Amer: >60 mL/min (ref >60)
Glucose, Bld: 88 mg/dL (ref 70–99)
Glucose, Bld: 90 mg/dL (ref 70–99)
Potassium: 3 mmol/L — ABNORMAL LOW (ref 3.5–5.1)
Potassium: 2.9 mmol/L — ABNORMAL LOW (ref 3.5–5.1)
Sodium: 143 mmol/L (ref 135–145)
Sodium: 143 mmol/L (ref 135–145)

## 2020-01-14 LAB — MAGNESIUM
Magnesium: 2.4 mg/dL (ref 1.7–2.4)
Magnesium: 2.1 mg/dL (ref 1.7–2.4)

## 2020-01-14 LAB — LIPASE, BLOOD: Lipase: 24 U/L (ref 11–51)

## 2020-01-14 MED ORDER — LABETALOL HCL 5 MG/ML IV SOLN
10.0000 mg | INTRAVENOUS | Status: DC | PRN
  Administered 2020-01-15: 06:00:00 10 mg via INTRAVENOUS
  Filled 2020-01-14 (×3): qty 4

## 2020-01-14 MED ORDER — POTASSIUM CHLORIDE 10 MEQ/100ML IV SOLN
10.0000 meq | INTRAVENOUS | Status: AC
  Administered 2020-01-14 (×3): 10 meq via INTRAVENOUS
  Filled 2020-01-14 (×2): qty 100

## 2020-01-14 MED ORDER — BRINZOLAMIDE 1 % OP SUSP
1.0000 [drp] | Freq: Three times a day (TID) | OPHTHALMIC | Status: DC
  Administered 2020-01-14 – 2020-01-20 (×17): 1 [drp] via OPHTHALMIC
  Filled 2020-01-14 (×2): qty 10

## 2020-01-14 MED ORDER — BRIMONIDINE TARTRATE 0.2 % OP SOLN
1.0000 [drp] | Freq: Two times a day (BID) | OPHTHALMIC | Status: DC
  Administered 2020-01-14 – 2020-01-20 (×12): 1 [drp] via OPHTHALMIC
  Filled 2020-01-14: qty 5

## 2020-01-14 MED ORDER — BRIMONIDINE TARTRATE-TIMOLOL 0.2-0.5 % OP SOLN
1.0000 [drp] | Freq: Two times a day (BID) | OPHTHALMIC | Status: DC
  Filled 2020-01-14: qty 5

## 2020-01-14 MED ORDER — LATANOPROST 0.005 % OP SOLN
1.0000 [drp] | Freq: Every day | OPHTHALMIC | Status: DC
  Administered 2020-01-14 – 2020-01-19 (×6): 1 [drp] via OPHTHALMIC
  Filled 2020-01-14: qty 2.5

## 2020-01-14 MED ORDER — SODIUM CHLORIDE 0.9 % IV BOLUS
1000.0000 mL | Freq: Once | INTRAVENOUS | Status: AC
  Administered 2020-01-14: 1000 mL via INTRAVENOUS

## 2020-01-14 MED ORDER — SODIUM CHLORIDE 0.9% FLUSH
3.0000 mL | Freq: Two times a day (BID) | INTRAVENOUS | Status: DC
  Administered 2020-01-15 – 2020-01-20 (×2): 3 mL via INTRAVENOUS

## 2020-01-14 MED ORDER — HEPARIN SODIUM (PORCINE) 5000 UNIT/ML IJ SOLN
5000.0000 [IU] | Freq: Two times a day (BID) | INTRAMUSCULAR | Status: DC
  Administered 2020-01-14 – 2020-01-20 (×12): 5000 [IU] via SUBCUTANEOUS
  Filled 2020-01-14 (×12): qty 1

## 2020-01-14 MED ORDER — IOHEXOL 300 MG/ML  SOLN
100.0000 mL | Freq: Once | INTRAMUSCULAR | Status: AC | PRN
  Administered 2020-01-14: 100 mL via INTRAVENOUS

## 2020-01-14 MED ORDER — POTASSIUM CHLORIDE 10 MEQ/100ML IV SOLN
10.0000 meq | INTRAVENOUS | Status: AC
  Administered 2020-01-14 (×6): 10 meq via INTRAVENOUS
  Filled 2020-01-14: qty 100

## 2020-01-14 MED ORDER — ACETAMINOPHEN 325 MG PO TABS: 650.0000 mg | ORAL_TABLET | Freq: Four times a day (QID) | ORAL | Status: DC | PRN

## 2020-01-14 MED ORDER — SODIUM CHLORIDE 0.9% FLUSH
3.0000 mL | Freq: Once | INTRAVENOUS | Status: AC
  Administered 2020-01-14: 3 mL via INTRAVENOUS

## 2020-01-14 MED ORDER — ACETAMINOPHEN 650 MG RE SUPP: 650.0000 mg | Freq: Four times a day (QID) | RECTAL | Status: DC | PRN

## 2020-01-14 MED ORDER — TIMOLOL MALEATE 0.5 % OP SOLN
1.0000 [drp] | Freq: Two times a day (BID) | OPHTHALMIC | Status: DC
  Administered 2020-01-14 – 2020-01-20 (×12): 1 [drp] via OPHTHALMIC
  Filled 2020-01-14: qty 5

## 2020-01-14 MED ORDER — SODIUM CHLORIDE (PF) 0.9 % IJ SOLN
INTRAMUSCULAR | Status: AC
  Administered 2020-01-14: 10 mL
  Filled 2020-01-14: qty 50

## 2020-01-14 MED ORDER — SODIUM CHLORIDE 0.9 % IV SOLN: INTRAVENOUS | Status: DC

## 2020-01-14 MED ORDER — FENTANYL CITRATE (PF) 100 MCG/2ML IJ SOLN
50.0000 ug | Freq: Once | INTRAMUSCULAR | Status: AC
  Administered 2020-01-14: 50 ug via INTRAVENOUS
  Filled 2020-01-14: qty 2

## 2020-01-14 NOTE — H&P (Addendum)
History and Physical    Justin Griffin [id number] DOB: Sep 23, 1941 DOA: 01/14/2020  PCP: [facility] [place]   Patient coming from: [facility]  I have personally briefly reviewed patient's old medical records in [facility]  Chief Complaint: Abdominal distention and reported diarrhea  HPI: Justin Griffin is a 79 y.o. male with medical history significant of Alzheimer's disease, hypertension, hyperlipidemia, CAD, GERD, and rheumatoid arthritis who presents from his nursing home with abdominal distention and diarrhea.  Patient is nonverbal at baseline and unable to provide any history.  Spoke with the patient's nurse.  Per the AM nurse, night shift nurse reported worsening abdominal distension over the past few days.  The patient has been having several days of loose stools, initially thought to be iatrogenic from stool softeners, but then it persisted.  He has not been on antibiotics recently.  Per the nurse no on the floor has had C. difficile recently.  Nurse reports he occasionally opens his eyes.  He is non-verbal.  The CNA at the facility feeds him and he normally eats 100% of his meals.  He is bed bound at baseline.  He spontaneously moves his arms and legs but does not follow any commands.  Patient is DNR/DNI and per their report he has been transitioning to more of a comfort measures but he is not officially hospice ([facility] is the organization).   Spoke with patient's daughter Justin Griffin regarding admission and probably goals of care.  She confirmed that he would indeed be DNR/DNI and confirms her prior discussions regarding hospice but they did not feel that was necessary at that time.  She would like to proceed with acute medical care but if patient were to decline she thinks hospice would be appropriate but would want to discuss with her sister [name].  We also discussed if he improves and were to go home, should he develop another reason that would  prompt facility to bring him back to the ER would they want him to come to the hospital or potentially just be treated for symptoms at home.  She said she understood the discussion and thought that might be appropriate but at this time would want to continue acute medical care.  Per primary RN at [facility] patient had a formed stool upon arrival to the floor.  Review of Systems: As per HPI otherwise 10 point review of systems negative.    Past Medical History:  Diagnosis Date  . Alzheimer's disease ([place])   . CAD (coronary artery disease)    Catheterization 2006 LAD occluded, OM 95% stenosis followed by mid occlusion, first obtuse marginal occluded, right coronary artery diffuse moderate disease. LIMA to the LAD was patent. Saphenous vein to PDA and posterior lateral patent with diffuse luminal irregularities, saphenous vein graft to second obtuse marginal is patent, saphenous vein graft to the first obtuse marginal had 25% stenosis.  . Esophageal reflux   . Esophageal stricture   . Other and unspecified hyperlipidemia   . Other dysphagia   . Rheumatoid arthritis(714.0)   . Status post dilation of esophageal narrowing   . Unspecified essential hypertension     Past Surgical History:  Procedure Laterality Date  . CATARACT EXTRACTION, BILATERAL    . CORONARY ARTERY BYPASS GRAFT  1996  . INGUINAL HERNIA REPAIR Right 04/05/2017   Procedure: OPEN RIGHT INGUINAL HERNIA REPAIR;  Surgeon: Alphonsa Overall, MD;  Location: [facility];  Service: General;  Laterality: Right;  .  INSERTION OF MESH Right 04/05/2017   Procedure: INSERTION OF MESH;  Surgeon: Alphonsa Overall, MD;  Location: [facility];  Service: General;  Laterality: Right;  . left digit amputation       reports that he quit smoking about 25 years ago. His smoking use included cigarettes. He quit after 0.00 years of use. He has never used smokeless tobacco. He reports that he does not drink alcohol or use drugs.  Allergies  Allergen Reactions  .  Ace Inhibitors Other (See Comments)    "Allergic," per paperwork from [facility]  . Lisinopril Other (See Comments)    "Allergic," per paperwork from [facility]  . Zocor [Simvastatin] Other (See Comments)    Muscle pain    Family History  Problem Relation Age of Onset  . Heart disease Maternal Grandfather   . Prostate cancer Paternal Grandfather   . Heart disease Mother   . Colon cancer Neg Hx   . Esophageal cancer Neg Hx   . Stomach cancer Neg Hx   . Pancreatic cancer Neg Hx   . Liver disease Neg Hx     Prior to Admission medications   Medication Sig Start Date End Date Taking? Authorizing Provider  aspirin EC 81 MG tablet Take 81 mg by mouth daily.   Yes [provider]  brimonidine-timolol (COMBIGAN) 0.2-0.5 % ophthalmic solution Place 1 drop into the left eye every 12 (twelve) hours.   Yes [provider]  brinzolamide (AZOPT) 1 % ophthalmic suspension Place 1 drop into the left eye 3 (three) times daily.   Yes [provider]  latanoprost (XALATAN) 0.005 % ophthalmic solution Place 1 drop into both eyes at bedtime.   Yes [provider]  NON FORMULARY Take 1 Can by mouth See admin instructions. Mighty Shake: Drink 1 can by mouth three times a day- 8:30 AM/11:30 AM/6 PM   Yes [provider]  pantoprazole (PROTONIX) 40 MG tablet Take 40 mg by mouth daily before breakfast.   Yes [provider]    Physical Exam: Vitals:   01/14/20 0147 01/14/20 0428 01/14/20 0628 01/14/20 0824  BP: (!) 178/113 (!) 182/103 (!) 150/104 (!) 184/90  Pulse: 73 80 73 78  Resp: 11 13 12 16   Temp: (!) 97.2 F (36.2 C)  97.9 F (36.6 C) 98.5 F (36.9 C)  TempSrc: Axillary  Oral Oral  SpO2: 100% 100% 100% 100%    Vitals:   01/14/20 0147 01/14/20 0428 01/14/20 0628 01/14/20 0824  BP: (!) 178/113 (!) 182/103 (!) 150/104 (!) 184/90  Pulse: 73 80 73 78  Resp: 11 13 12 16   Temp: (!) 97.2 F (36.2 C)  97.9 F (36.6 C) 98.5 F (36.9 C)    TempSrc: Axillary  Oral Oral  SpO2: 100% 100% 100% 100%     Constitutional: NAD, calm, non-verbal and not following commands Eyes: Eyes showed and dry ENMT: Mucous membranes are dry. Posterior pharynx clear of any exudate or lesions Neck: normal, supple, no masses, no thyromegaly Respiratory: clear to auscultation bilaterally, no wheezing, no crackles. Normal respiratory effort. No accessory muscle use.  Cardiovascular: Regular rate and rhythm, no murmurs / rubs / gallops. No extremity edema. 2+ pedal pulses. Abdomen: Distended, bowel sounds hypoactive Musculoskeletal: no clubbing / cyanosis. No joint deformity upper and lower extremities. Good ROM, no contractures. Normal muscle tone.  Skin: no rashes, lesions, ulcers. No induration Neurologic: Very limited exam due to dementia and condition patient is spontaneous moving all extremities, more active upper  extremity movement.  Does not follow any commands.  Does spontaneously grip hands. Psychiatric: Poor judgment and insight   Labs on Admission: I have personally reviewed following labs and imaging studies  CBC: Recent Labs  Lab 01/14/20 0127  WBC 6.6  HGB 9.8*  HCT 32.8*  MCV 69.5*  PLT 99991111   Basic Metabolic Panel: Recent Labs  Lab 01/14/20 0127  NA 144  K 2.8*  CL 107  CO2 28  GLUCOSE 96  BUN 11  CREATININE 0.98  CALCIUM 9.0   GFR: CrCl cannot be calculated (Unknown ideal weight.). Liver Function Tests: Recent Labs  Lab 01/14/20 0127  AST 20  ALT 7  ALKPHOS 62  BILITOT 0.7  PROT 8.1  ALBUMIN 3.8   Recent Labs  Lab 01/14/20 0127  LIPASE 24   No results for input(s): AMMONIA in the last 168 hours. Coagulation Profile: No results for input(s): INR, PROTIME in the last 168 hours. Cardiac Enzymes: No results for input(s): CKTOTAL, CKMB, CKMBINDEX, TROPONINI in the last 168 hours. BNP (last 3 results) No results for input(s): PROBNP in the last 8760 hours. HbA1C: No results for input(s): HGBA1C in  the last 72 hours. CBG: No results for input(s): GLUCAP in the last 168 hours. Lipid Profile: No results for input(s): CHOL, HDL, LDLCALC, TRIG, CHOLHDL, LDLDIRECT in the last 72 hours. Thyroid Function Tests: No results for input(s): TSH, T4TOTAL, FREET4, T3FREE, THYROIDAB in the last 72 hours. Anemia Panel: No results for input(s): VITAMINB12, FOLATE, FERRITIN, TIBC, IRON, RETICCTPCT in the last 72 hours. Urine analysis:    Component Value Date/Time   COLORURINE YELLOW 01/14/2020 0127   APPEARANCEUR CLEAR 01/14/2020 0127   LABSPEC 1.020 01/14/2020 0127   PHURINE 7.0 01/14/2020 0127   GLUCOSEU NEGATIVE 01/14/2020 0127   HGBUR NEGATIVE 01/14/2020 0127   BILIRUBINUR NEGATIVE 01/14/2020 0127   KETONESUR NEGATIVE 01/14/2020 0127   PROTEINUR NEGATIVE 01/14/2020 0127   NITRITE NEGATIVE 01/14/2020 0127   LEUKOCYTESUR NEGATIVE 01/14/2020 0127    Radiological Exams on Admission: CT Abdomen Pelvis W Contrast  Result Date: 01/14/2020 CLINICAL DATA:  Distended abdomen. EXAM: CT ABDOMEN AND PELVIS WITH CONTRAST TECHNIQUE: Multidetector CT imaging of the abdomen and pelvis was performed using the standard protocol following bolus administration of intravenous contrast. CONTRAST:  110mL OMNIPAQUE IOHEXOL 300 MG/ML  SOLN COMPARISON:  Plain film same day plain film 03/21/2019 FINDINGS: Lower chest: Lung bases are clear. Hepatobiliary: No focal hepatic lesion. No biliary duct dilatation. Gallbladder is normal. Common bile duct is normal. Pancreas: Pancreas is normal. No ductal dilatation. No pancreatic inflammation. Spleen: Normal spleen Adrenals/urinary tract: Adrenal glands and kidneys are normal. The ureters and bladder normal. Stomach/Bowel: The stomach is compressed posteriorly by the dilated loops of colon. Small bowel is grossly normal. No evidence of small bowel dilatation. Cecum is stool filled. Ascending colon is stool filled. The transverse colon is markedly gas distended up to 8.4 cm. The  proximal descending colon relatively collapsed. The sigmoid colon is markedly dilated up to 9.3 cm. There is no twisting of the sigmoid colon. The distended sigmoid colon extends to the rectum with there is fluid in the rectum. No obstructing lesion identified. No evidence of twisting of the sigmoid colon to suggest volvulus. No intraperitoneal free air. Vascular/Lymphatic: Abdominal aorta is normal caliber with atherosclerotic calcification. There is no retroperitoneal or periportal lymphadenopathy. No pelvic lymphadenopathy. Reproductive: Prostate normal Other: No intraperitoneal free air no free fluid. Musculoskeletal: No aggressive osseous lesion. IMPRESSION: Markedly gas distended sigmoid colon  without obstruction lesion identified. Gas distension of the transverse colon and large volume stool in the ascending colon. Findings are most suggestive severe colonic ileus/institutional bowel. No intraperitoneal free air or fluid. Electronically Signed   By: Suzy Bouchard M.D.   On: 01/14/2020 05:53   DG Abd Portable 2 Views  Result Date: 01/14/2020 CLINICAL DATA:  Abdominal distension. EXAM: PORTABLE ABDOMEN - 2 VIEW COMPARISON:  March 21, 2019 FINDINGS: Multiple markedly dilated, air-filled loops of large bowel are seen. This extends from the cecum to the rectum, with a mild to moderate amount of stool seen within the proximal to mid ascending colon. The small bowel loops are subsequently limited in visualization. There is no evidence of free air. No radio-opaque calculi or other significant radiographic abnormality is seen. IMPRESSION: Multiple markedly dilated, air-filled loops of large bowel, as described above. While this may be chronic in nature, a distal large bowel obstruction cannot be excluded. Correlation with abdomen pelvis CT is recommended. Electronically Signed   By: Virgina Norfolk M.D.   On: 01/14/2020 03:05    Assessment/Plan MACIEJ DILLOW is a 79 y.o. male with medical history  significant of Alzheimer's disease, hypertension, hyperlipidemia, CAD, GERD, and rheumatoid arthritis who presents from his nursing home with abdominal distention and diarrhea noted to have distended sigmoid colon without obstructing lesion noted concerning for colonic ileus.  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 Diff (though no fever, tachycardia) - may consider contrast enema if failure to improve, no visualized obstruction on CT - not listed to have any contributing meds, not receiving opiates -mobilize as tolerated, replete electrolytes, maintain K > 4 and Mg > 2 - maintain NPO; RN reports he did have bowel movement of formed stool and concerned about placing NGT for decompression so for now will hold off and monitor with above measures - KUB in AM  # Hypokalemia - suspect from recent diarrhea, continue repletion - added-on Magnesium, will replete accordingly  # Alzheimer's Dementia - non-verbal, does not follow commands at baseline - no behavioral disturbances / marked agitation reported by facility  # Microcytic Anemia - stable, no reports of bleeding per facility - monitor  # HTN # HLD # GERD # CAD # Goals of Care - patient has been largely managed in a comfort approach and is on limited meds given dementia and overall prognosis, but still for acute problems family would still like evaluation and management at this time.  We did begin discussing hospice and if patient were to decline during this admission, they feel that would be appropriate but also feel it may be appropriate if patient were to discharge but no decision regarding that at this time.   DVT prophylaxis: SQH Code Status: DNR/DNI Family Communication: Spoke with daughter, Justin Griffin, who will relay conversation to sister Justin Griffin Disposition Plan: pending Admission status: Med surg   Truddie Hidden MD [facility] Pager [phone]  If 7PM-7AM, please contact  night-coverage [email or web address] Password [facility]  01/14/2020, 10:05 AM

## 2020-01-14 NOTE — ED Notes (Signed)
Pt in ct 

## 2020-01-14 NOTE — ED Provider Notes (Signed)
[facility] DEPT Provider Note   CSN: [id number] Arrival date & time: 01/14/20  0112   Time seen 4:25 AM  History Chief Complaint  Patient presents with  . Abdominal Pain   Level 5 caveat for dementia  Justin Griffin is a 79 y.o. male.  HPI   Patient was brought to the emergency department by EMS from his nursing facility stating he is having a swollen abdomen and having diarrhea for the past 3 days.  Patient is reportedly nonverbal at baseline.  [facility] [place]   Patient is DNR and in residential hospice as of March 24, 2027  Past Medical History:  Diagnosis Date  . Alzheimer's disease ([place])   . CAD (coronary artery disease)    Catheterization 2006 LAD occluded, OM 95% stenosis followed by mid occlusion, first obtuse marginal occluded, right coronary artery diffuse moderate disease. LIMA to the LAD was patent. Saphenous vein to PDA and posterior lateral patent with diffuse luminal irregularities, saphenous vein graft to second obtuse marginal is patent, saphenous vein graft to the first obtuse marginal had 25% stenosis.  . Esophageal reflux   . Esophageal stricture   . Other and unspecified hyperlipidemia   . Other dysphagia   . Rheumatoid arthritis(714.0)   . Status post dilation of esophageal narrowing   . Unspecified essential hypertension     Patient Active Problem List   Diagnosis Date Noted  . Adynamic ileus ([place]) 03/21/2019  . Dementia without behavioral disturbance ([place])   . Goals of care, counseling/discussion   . Palliative care by specialist   . Pressure injury of skin 03/20/2019  . Abdominal distention   . Sepsis ([place]) 03/19/2019  . Acute hypernatremia 03/19/2019  . Hypokalemia 03/19/2019  . Anemia 03/19/2019  . Dehydration   . Delirium 06/11/2017  . Acute kidney injury ([place]) 06/11/2017  . Fever 06/11/2017  . Effusion of right knee 06/11/2017  . Inguinal hernia 12/24/2016  . Testicular  pain, left 08/07/2016  . Hemorrhoid 08/07/2016  . 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 Routine general medical examination at a health care facility 01/27/2016  . Medicare annual wellness visit, subsequent 01/27/2016  . Essential hypertension, benign 08/08/2009  . GERD 07/04/2008  . RHEUMATOID ARTHRITIS 07/04/2008  . WEIGHT LOSS-ABNORMAL 07/04/2008  . DYSPHAGIA 07/04/2008  . TOBACCO USER 07/03/2008  . POLYP, COLON 02/04/2008  . DYSLIPIDEMIA 02/04/2008  . Essential hypertension 02/04/2008  . Coronary artery disease 02/04/2008  . POLYPECTOMY, HX OF 02/04/2008  . CORONARY ARTERY BYPASS GRAFT, HX OF 02/04/2008    Past Surgical History:  Procedure Laterality Date  . CATARACT EXTRACTION, BILATERAL    . CORONARY ARTERY BYPASS GRAFT  1996  . INGUINAL HERNIA REPAIR Right 04/05/2017   Procedure: OPEN RIGHT INGUINAL HERNIA REPAIR;  Surgeon: Alphonsa Overall, MD;  Location: [facility];  Service: General;  Laterality: Right;  . INSERTION OF MESH Right 04/05/2017   Procedure: INSERTION OF MESH;  Surgeon: Alphonsa Overall, MD;  Location: [facility];  Service: General;  Laterality: Right;  . left digit amputation         Family History  Problem Relation Age of Onset  . Heart disease Maternal Grandfather   . Prostate cancer Paternal Grandfather   . Heart disease Mother   . Colon cancer Neg Hx   . Esophageal cancer Neg Hx   . Stomach cancer Neg Hx   . Pancreatic cancer Neg Hx   . Liver disease  Neg Hx     Social History   Tobacco Use  . Smoking status: Former Smoker    Years: 0.00    Types: Cigarettes    Quit date: 10/05/1994    Years since quitting: 25.2  . Smokeless tobacco: Never Used  Substance Use Topics  . Alcohol use: No  . Drug use: No  lives at [facility] Medications Prior to Admission medications   Medication Sig Start Date End Date Taking? Authorizing Provider  aspirin EC 81 MG tablet Take 81 mg by mouth daily.   Yes [provider]  brimonidine-timolol (COMBIGAN) 0.2-0.5 % ophthalmic solution Place 1 drop into the left eye every 12 (twelve) hours.   Yes [provider]  brinzolamide (AZOPT) 1 % ophthalmic suspension Place 1 drop into the left eye 3 (three) times daily.   Yes [provider]  latanoprost (XALATAN) 0.005 % ophthalmic solution Place 1 drop into both eyes at bedtime.   Yes [provider]  NON FORMULARY Take 1 Can by mouth See admin instructions. Mighty Shake: Drink 1 can by mouth three times a day- 8:30 AM/11:30 AM/6 PM   Yes [provider]  pantoprazole (PROTONIX) 40 MG tablet Take 40 mg by mouth daily before breakfast.   Yes [provider]    Allergies    Ace inhibitors, Lisinopril, and Zocor [simvastatin]  Review of Systems   Review of Systems  Unable to perform ROS: Dementia    Physical Exam Updated Vital Signs BP (!) 150/104 (BP Location: Left Arm)   Pulse 73   Temp 97.9 F (36.6 C) (Oral)   Resp 12   SpO2 100%   Physical Exam Vitals and nursing note reviewed.  Constitutional:      Comments: Thin elderly male  HENT:     Head: Normocephalic and atraumatic.  Eyes:     Extraocular Movements: Extraocular movements intact.     Conjunctiva/sclera: Conjunctivae normal.     Pupils: Pupils are equal, round, and reactive to light.  Cardiovascular:     Rate and Rhythm: Normal rate and regular rhythm.  Pulmonary:     Effort: Pulmonary effort is normal. No respiratory distress.     Breath sounds: Normal breath sounds.  Abdominal:     General: Bowel sounds are decreased. There is distension.     Tenderness: There is abdominal tenderness. There is no guarding or rebound.  Musculoskeletal:        General: No deformity.  Skin:    General: Skin is warm and dry.  Neurological:     Mental Status: He is alert. Mental status is at baseline.     ED Results / Procedures / Treatments   Labs (all labs ordered are listed, but only  abnormal results are displayed) Results for orders placed or performed during the hospital encounter of 01/14/20  Lipase, blood  Result Value Ref Range   Lipase 24 11 - 51 U/L  Comprehensive metabolic panel  Result Value Ref Range   Sodium 144 135 - 145 mmol/L   Potassium 2.8 (L) 3.5 - 5.1 mmol/L   Chloride 107 98 - 111 mmol/L   CO2 28 22 - 32 mmol/L   Glucose, Bld 96 70 - 99 mg/dL   BUN 11 8 - 23 mg/dL   Creatinine, Ser 0.98 0.61 - 1.24 mg/dL   Calcium 9.0 8.9 - 10.3 mg/dL   Total Protein 8.1 6.5 - 8.1 g/dL   Albumin 3.8 3.5 - 5.0 g/dL  AST 20 15 - 41 U/L   ALT 7 0 - 44 U/L   Alkaline Phosphatase 62 38 - 126 U/L   Total Bilirubin 0.7 0.3 - 1.2 mg/dL   GFR calc non Af Amer >60 >60 mL/min   GFR calc Af Amer >60 >60 mL/min   Anion gap 9 5 - 15  CBC  Result Value Ref Range   WBC 6.6 4.0 - 10.5 K/uL   RBC 4.72 4.22 - 5.81 MIL/uL   Hemoglobin 9.8 (L) 13.0 - 17.0 g/dL   HCT 32.8 (L) 39.0 - 52.0 %   MCV 69.5 (L) 80.0 - 100.0 fL   MCH 20.8 (L) 26.0 - 34.0 pg   MCHC 29.9 (L) 30.0 - 36.0 g/dL   RDW 21.2 (H) 11.5 - 15.5 %   Platelets 302 150 - 400 K/uL   nRBC 0.0 0.0 - 0.2 %   Laboratory interpretation all normal except hypokalemia, stable anemia    EKG EKG Interpretation  Date/Time:  Sunday January 14 2020 01:50:44 EDT Ventricular Rate:  67 PR Interval:    QRS Duration: 119 QT Interval:  433 QTC Calculation: 458 R Axis:   4 Text Interpretation: Sinus rhythm Short PR interval Incomplete left bundle branch block Since last tracing rate slower 19 Mar 2019 Confirmed by Rolland Porter [phone]) on 01/14/2020 2:02:00 AM   Radiology CT Abdomen Pelvis W Contrast  Result Date: 01/14/2020 CLINICAL DATA:  Distended abdomen. EXAM: CT ABDOMEN AND PELVIS WITH CONTRAST TECHNIQUE: Multidetector CT imaging of the abdomen and pelvis was performed using the standard protocol following bolus administration of intravenous contrast. CONTRAST:  154mL OMNIPAQUE IOHEXOL 300 MG/ML  SOLN COMPARISON:   Plain film same day plain film 03/21/2019 FINDINGS: Lower chest: Lung bases are clear. Hepatobiliary: No focal hepatic lesion. No biliary duct dilatation. Gallbladder is normal. Common bile duct is normal. Pancreas: Pancreas is normal. No ductal dilatation. No pancreatic inflammation. Spleen: Normal spleen Adrenals/urinary tract: Adrenal glands and kidneys are normal. The ureters and bladder normal. Stomach/Bowel: The stomach is compressed posteriorly by the dilated loops of colon. Small bowel is grossly normal. No evidence of small bowel dilatation. Cecum is stool filled. Ascending colon is stool filled. The transverse colon is markedly gas distended up to 8.4 cm. The proximal descending colon relatively collapsed. The sigmoid colon is markedly dilated up to 9.3 cm. There is no twisting of the sigmoid colon. The distended sigmoid colon extends to the rectum with there is fluid in the rectum. No obstructing lesion identified. No evidence of twisting of the sigmoid colon to suggest volvulus. No intraperitoneal free air. Vascular/Lymphatic: Abdominal aorta is normal caliber with atherosclerotic calcification. There is no retroperitoneal or periportal lymphadenopathy. No pelvic lymphadenopathy. Reproductive: Prostate normal Other: No intraperitoneal free air no free fluid. Musculoskeletal: No aggressive osseous lesion. IMPRESSION: Markedly gas distended sigmoid colon without obstruction lesion identified. Gas distension of the transverse colon and large volume stool in the ascending colon. Findings are most suggestive severe colonic ileus/institutional bowel. No intraperitoneal free air or fluid. Electronically Signed   By: Suzy Bouchard M.D.   On: 01/14/2020 05:53   DG Abd Portable 2 Views  Result Date: 01/14/2020 CLINICAL DATA:  Abdominal distension. EXAM: PORTABLE ABDOMEN - 2 VIEW COMPARISON:  March 21, 2019 FINDINGS: Multiple markedly dilated, air-filled loops of large bowel are seen. This extends from the  cecum to the rectum, with a mild to moderate amount of stool seen within the proximal to mid ascending colon. The small bowel loops are  subsequently limited in visualization. There is no evidence of free air. No radio-opaque calculi or other significant radiographic abnormality is seen. IMPRESSION: Multiple markedly dilated, air-filled loops of large bowel, as described above. While this may be chronic in nature, a distal large bowel obstruction cannot be excluded. Correlation with abdomen pelvis CT is recommended. Electronically Signed   By: Virgina Norfolk M.D.   On: 01/14/2020 03:05    Procedures .Critical Care Performed by: Rolland Porter, MD Authorized by: Rolland Porter, MD   Critical care provider statement:    Critical care time (minutes):  31   Critical care was necessary to treat or prevent imminent or life-threatening deterioration of the following conditions: abdominal obstruction.   Critical care was time spent personally by me on the following activities:  Discussions with consultants, examination of patient, re-evaluation of patient's condition and ordering and review of radiographic studies   (including critical care time)  Medications Ordered in ED Medications  potassium chloride 10 mEq in 100 mL IVPB (10 mEq Intravenous New Bag/Given 01/14/20 0628)  sodium chloride flush (NS) 0.9 % injection 3 mL (3 mLs Intravenous Given 01/14/20 0206)  sodium chloride 0.9 % bolus 1,000 mL (0 mLs Intravenous Stopped 01/14/20 0615)  fentaNYL (SUBLIMAZE) injection 50 mcg (50 mcg Intravenous Given 01/14/20 0516)  iohexol (OMNIPAQUE) 300 MG/ML solution 100 mL (100 mLs Intravenous Contrast Given 01/14/20 0451)  sodium chloride (PF) 0.9 % injection (10 mLs  Given 01/14/20 0516)    ED Course  I have reviewed the triage vital signs and the nursing notes.  Pertinent labs & imaging results that were available during my care of the patient were reviewed by me and considered in my medical decision making (see  chart for details).    MDM Rules/Calculators/A&P                      Started with 2 view abdomen which showed distention of the large bowel, radiologist recommended CT of the abdomen/pelvis which was done.  Patient had an IV and was given IV fluids and IV pain medication.  CT scan is consistent with ileus.  Patient also has a hypokalemia which could contribute to his ileus.  That was treated with IV potassium.  Patient will need observation admission to see if his ileus resolves.  6:44 AM Dr. Myna Hidalgo, hospitalist will admit.   Final Clinical Impression(s) / ED Diagnoses Final diagnoses:  Abdominal distension  Hypokalemia  Ileus [facility])    Rx / DC Orders  Plan admission  Rolland Porter, MD, Barbette Or, MD 01/14/20 [phone]

## 2020-01-14 NOTE — ED Triage Notes (Signed)
Pt comes from nursing home [facility]/ ems states pt has had a distended abdomen and loose stool for past 3 days. Pt has dementia at a baseline with alzheimer. Pt is non verbal at baseline. Pt v/s on arrival 173/100, hr 70 rrr16, 98 Sp02  room air, temp 97.9.

## 2020-01-14 NOTE — ED Notes (Signed)
Justin Griffin   [phone]

## 2020-01-14 NOTE — ED Notes (Signed)
ED TO INPATIENT HANDOFF REPORT  ED Nurse Name and Phone #: jon wled   S Name/Age/Gender Justin Griffin 79 y.o. male Room/Bed: WA09/WA09  Code Status   Code Status: Prior  Home/SNF/Other  From [facility] Is this baseline? Non verbal dementia  Triage Complete: Triage complete  Chief Complaint Ileus [facility]) [K56.7]  Triage Note Pt comes from nursing home [facility]/ ems states pt has had a distended abdomen and loose stool for past 3 days. Pt has dementia at a baseline with alzheimer. Pt is non verbal at baseline. Pt v/s on arrival 173/100, hr 70 rrr16, 98 Sp02  room air, temp 97.9.    Allergies Allergies  Allergen Reactions  . Ace Inhibitors Other (See Comments)    "Allergic," per paperwork from [facility]  . Lisinopril Other (See Comments)    "Allergic," per paperwork from [facility]  . Zocor [Simvastatin] Other (See Comments)    Muscle pain    Level of Care/Admitting Diagnosis ED Disposition    ED Disposition Condition Comment   Admit  Hospital Area: [facility] [[id number]]  Level of Care: Med-Surg [16]  Covid Evaluation: Asymptomatic Screening Protocol (No Symptoms)  [facility]: [id number]  Admitting Physician: Vianne Bulls [id number]  Attending Physician: Vianne Bulls [id number]       B Medical/Surgery History Past Medical History:  Diagnosis Date  . Alzheimer's disease ([place])   . CAD (coronary artery disease)    Catheterization 2006 LAD occluded, OM 95% stenosis followed by mid occlusion, first obtuse marginal occluded, right coronary artery diffuse moderate disease. LIMA to the LAD was patent. Saphenous vein to PDA and posterior lateral patent with diffuse luminal irregularities, saphenous vein graft to second obtuse marginal is patent, saphenous vein graft to the first obtuse marginal had 25% stenosis.  . Esophageal reflux   . Esophageal stricture   . Other and unspecified hyperlipidemia   . Other dysphagia   . Rheumatoid  arthritis(714.0)   . Status post dilation of esophageal narrowing   . Unspecified essential hypertension    Past Surgical History:  Procedure Laterality Date  . CATARACT EXTRACTION, BILATERAL    . CORONARY ARTERY BYPASS GRAFT  1996  . INGUINAL HERNIA REPAIR Right 04/05/2017   Procedure: OPEN RIGHT INGUINAL HERNIA REPAIR;  Surgeon: Alphonsa Overall, MD;  Location: [facility];  Service: General;  Laterality: Right;  . INSERTION OF MESH Right 04/05/2017   Procedure: INSERTION OF MESH;  Surgeon: Alphonsa Overall, MD;  Location: [facility];  Service: General;  Laterality: Right;  . left digit amputation       A IV Location/Drains/Wounds Patient Lines/Drains/Airways Status   Active Line/Drains/Airways    Name:   Placement date:   Placement time:   Site:   Days:   Peripheral IV 03/19/19 Right Antecubital   03/19/19    0153    Antecubital   301   Peripheral IV 01/14/20 Right Forearm   01/14/20    0138    Forearm   less than 1   External Urinary Catheter   01/14/20    0154    --   less than 1   Pressure Injury 03/20/19 Ankle Left;Lateral Unstageable - Full thickness tissue loss in which the base of the ulcer is covered by slough (yellow, tan, gray, green or brown) and/or eschar (tan, brown or black) in the wound bed.   03/20/19    1500     300   Wound / Incision (Open or Dehisced)  03/20/19 Other (Comment) Hand Anterior;Right    03/20/19    0710    Hand   300          Intake/Output Last 24 hours No intake or output data in the 24 hours ending 01/14/20 0656  Labs/Imaging Results for orders placed or performed during the hospital encounter of 01/14/20 (from the past 48 hour(s))  Lipase, blood     Status: None   Collection Time: 01/14/20  1:27 AM  Result Value Ref Range   Lipase 24 11 - 51 U/L    Comment: Performed at [facility], [place] [address], [place], [place] [address]  Comprehensive metabolic panel     Status: Abnormal   Collection Time: 01/14/20  1:27 AM  Result Value Ref Range    Sodium 144 135 - 145 mmol/L   Potassium 2.8 (L) 3.5 - 5.1 mmol/L   Chloride 107 98 - 111 mmol/L   CO2 28 22 - 32 mmol/L   Glucose, Bld 96 70 - 99 mg/dL    Comment: Glucose reference range applies only to samples taken after fasting for at least 8 hours.   BUN 11 8 - 23 mg/dL   Creatinine, Ser 0.98 0.61 - 1.24 mg/dL   Calcium 9.0 8.9 - 10.3 mg/dL   Total Protein 8.1 6.5 - 8.1 g/dL   Albumin 3.8 3.5 - 5.0 g/dL   AST 20 15 - 41 U/L   ALT 7 0 - 44 U/L   Alkaline Phosphatase 62 38 - 126 U/L   Total Bilirubin 0.7 0.3 - 1.2 mg/dL   GFR calc non Af Amer >60 >60 mL/min   GFR calc Af Amer >60 >60 mL/min   Anion gap 9 5 - 15    Comment: Performed at [facility], [place] [address], [place], [place] [address]  CBC     Status: Abnormal   Collection Time: 01/14/20  1:27 AM  Result Value Ref Range   WBC 6.6 4.0 - 10.5 K/uL   RBC 4.72 4.22 - 5.81 MIL/uL   Hemoglobin 9.8 (L) 13.0 - 17.0 g/dL   HCT 32.8 (L) 39.0 - 52.0 %   MCV 69.5 (L) 80.0 - 100.0 fL   MCH 20.8 (L) 26.0 - 34.0 pg   MCHC 29.9 (L) 30.0 - 36.0 g/dL   RDW 21.2 (H) 11.5 - 15.5 %   Platelets 302 150 - 400 K/uL    Comment: REPEATED TO VERIFY   nRBC 0.0 0.0 - 0.2 %    Comment: Performed at [facility], [place] [address] [place], [place] [address]   CT Abdomen Pelvis W Contrast  Result Date: 01/14/2020 CLINICAL DATA:  Distended abdomen. EXAM: CT ABDOMEN AND PELVIS WITH CONTRAST TECHNIQUE: Multidetector CT imaging of the abdomen and pelvis was performed using the standard protocol following bolus administration of intravenous contrast. CONTRAST:  160mL OMNIPAQUE IOHEXOL 300 MG/ML  SOLN COMPARISON:  Plain film same day plain film 03/21/2019 FINDINGS: Lower chest: Lung bases are clear. Hepatobiliary: No focal hepatic lesion. No biliary duct dilatation. Gallbladder is normal. Common bile duct is normal. Pancreas: Pancreas is normal. No ductal dilatation. No pancreatic inflammation. Spleen: Normal spleen  Adrenals/urinary tract: Adrenal glands and kidneys are normal. The ureters and bladder normal. Stomach/Bowel: The stomach is compressed posteriorly by the dilated loops of colon. Small bowel is grossly normal. No evidence of small bowel dilatation. Cecum is stool filled. Ascending colon is stool filled. The transverse colon is markedly gas distended up  to 8.4 cm. The proximal descending colon relatively collapsed. The sigmoid colon is markedly dilated up to 9.3 cm. There is no twisting of the sigmoid colon. The distended sigmoid colon extends to the rectum with there is fluid in the rectum. No obstructing lesion identified. No evidence of twisting of the sigmoid colon to suggest volvulus. No intraperitoneal free air. Vascular/Lymphatic: Abdominal aorta is normal caliber with atherosclerotic calcification. There is no retroperitoneal or periportal lymphadenopathy. No pelvic lymphadenopathy. Reproductive: Prostate normal Other: No intraperitoneal free air no free fluid. Musculoskeletal: No aggressive osseous lesion. IMPRESSION: Markedly gas distended sigmoid colon without obstruction lesion identified. Gas distension of the transverse colon and large volume stool in the ascending colon. Findings are most suggestive severe colonic ileus/institutional bowel. No intraperitoneal free air or fluid. Electronically Signed   By: Suzy Bouchard M.D.   On: 01/14/2020 05:53   DG Abd Portable 2 Views  Result Date: 01/14/2020 CLINICAL DATA:  Abdominal distension. EXAM: PORTABLE ABDOMEN - 2 VIEW COMPARISON:  March 21, 2019 FINDINGS: Multiple markedly dilated, air-filled loops of large bowel are seen. This extends from the cecum to the rectum, with a mild to moderate amount of stool seen within the proximal to mid ascending colon. The small bowel loops are subsequently limited in visualization. There is no evidence of free air. No radio-opaque calculi or other significant radiographic abnormality is seen. IMPRESSION: Multiple  markedly dilated, air-filled loops of large bowel, as described above. While this may be chronic in nature, a distal large bowel obstruction cannot be excluded. Correlation with abdomen pelvis CT is recommended. Electronically Signed   By: Virgina Norfolk M.D.   On: 01/14/2020 03:05    Pending Labs Unresulted Labs (From admission, onward)    Start     Ordered   01/14/20 0127  Urinalysis, Routine w reflex microscopic  ONCE - STAT,   STAT     01/14/20 0129          Vitals/Pain Today's Vitals   01/14/20 0147 01/14/20 0428 01/14/20 0612 01/14/20 0628  BP: (!) 178/113 (!) 182/103  (!) 150/104  Pulse: 73 80  73  Resp: 11 13  12   Temp: (!) 97.2 F (36.2 C)   97.9 F (36.6 C)  TempSrc: Axillary   Oral  SpO2: 100% 100%  100%  PainSc:   0-No pain     Isolation Precautions No active isolations  Medications Medications  potassium chloride 10 mEq in 100 mL IVPB (10 mEq Intravenous New Bag/Given 01/14/20 0628)  labetalol (NORMODYNE) injection 10 mg (has no administration in time range)  sodium chloride flush (NS) 0.9 % injection 3 mL (3 mLs Intravenous Given 01/14/20 0206)  sodium chloride 0.9 % bolus 1,000 mL (0 mLs Intravenous Stopped 01/14/20 0615)  fentaNYL (SUBLIMAZE) injection 50 mcg (50 mcg Intravenous Given 01/14/20 0516)  iohexol (OMNIPAQUE) 300 MG/ML solution 100 mL (100 mLs Intravenous Contrast Given 01/14/20 0451)  sodium chloride (PF) 0.9 % injection (10 mLs  Given 01/14/20 0516)    Mobility  High fall risk   Focused Assessments    R Recommendations: See Admitting Provider Note  Report given to:   Additional Notes:

## 2020-01-15 ENCOUNTER — Observation Stay (HOSPITAL_COMMUNITY): Payer: Medicare (Managed Care)

## 2020-01-15 DIAGNOSIS — Z951 Presence of aortocoronary bypass graft: Secondary | ICD-10-CM | POA: Diagnosis not present

## 2020-01-15 DIAGNOSIS — Z7401 Bed confinement status: Secondary | ICD-10-CM | POA: Diagnosis not present

## 2020-01-15 DIAGNOSIS — Z7189 Other specified counseling: Secondary | ICD-10-CM | POA: Diagnosis not present

## 2020-01-15 DIAGNOSIS — Z515 Encounter for palliative care: Secondary | ICD-10-CM | POA: Diagnosis present

## 2020-01-15 DIAGNOSIS — R935 Abnormal findings on diagnostic imaging of other abdominal regions, including retroperitoneum: Secondary | ICD-10-CM

## 2020-01-15 DIAGNOSIS — F0280 Dementia in other diseases classified elsewhere without behavioral disturbance: Secondary | ICD-10-CM | POA: Diagnosis present

## 2020-01-15 DIAGNOSIS — I10 Essential (primary) hypertension: Secondary | ICD-10-CM | POA: Diagnosis present

## 2020-01-15 DIAGNOSIS — K2100 Gastro-esophageal reflux disease with esophagitis, without bleeding: Secondary | ICD-10-CM | POA: Diagnosis present

## 2020-01-15 DIAGNOSIS — E876 Hypokalemia: Secondary | ICD-10-CM | POA: Diagnosis present

## 2020-01-15 DIAGNOSIS — Z20822 Contact with and (suspected) exposure to covid-19: Secondary | ICD-10-CM | POA: Diagnosis present

## 2020-01-15 DIAGNOSIS — Z87891 Personal history of nicotine dependence: Secondary | ICD-10-CM | POA: Diagnosis not present

## 2020-01-15 DIAGNOSIS — K5981 Ogilvie syndrome: Secondary | ICD-10-CM | POA: Diagnosis present

## 2020-01-15 DIAGNOSIS — Z7982 Long term (current) use of aspirin: Secondary | ICD-10-CM | POA: Diagnosis not present

## 2020-01-15 DIAGNOSIS — R140 Abdominal distension (gaseous): Secondary | ICD-10-CM | POA: Diagnosis not present

## 2020-01-15 DIAGNOSIS — E785 Hyperlipidemia, unspecified: Secondary | ICD-10-CM | POA: Diagnosis present

## 2020-01-15 DIAGNOSIS — M069 Rheumatoid arthritis, unspecified: Secondary | ICD-10-CM | POA: Diagnosis present

## 2020-01-15 DIAGNOSIS — Z8042 Family history of malignant neoplasm of prostate: Secondary | ICD-10-CM | POA: Diagnosis not present

## 2020-01-15 DIAGNOSIS — Z8249 Family history of ischemic heart disease and other diseases of the circulatory system: Secondary | ICD-10-CM | POA: Diagnosis not present

## 2020-01-15 DIAGNOSIS — G309 Alzheimer's disease, unspecified: Secondary | ICD-10-CM | POA: Diagnosis present

## 2020-01-15 DIAGNOSIS — Z66 Do not resuscitate: Secondary | ICD-10-CM | POA: Diagnosis present

## 2020-01-15 DIAGNOSIS — M1990 Unspecified osteoarthritis, unspecified site: Secondary | ICD-10-CM | POA: Diagnosis present

## 2020-01-15 DIAGNOSIS — Z79899 Other long term (current) drug therapy: Secondary | ICD-10-CM | POA: Diagnosis not present

## 2020-01-15 DIAGNOSIS — D509 Iron deficiency anemia, unspecified: Secondary | ICD-10-CM | POA: Diagnosis present

## 2020-01-15 DIAGNOSIS — F0390 Unspecified dementia without behavioral disturbance: Secondary | ICD-10-CM | POA: Diagnosis not present

## 2020-01-15 DIAGNOSIS — R531 Weakness: Secondary | ICD-10-CM | POA: Diagnosis not present

## 2020-01-15 DIAGNOSIS — K567 Ileus, unspecified: Secondary | ICD-10-CM | POA: Diagnosis present

## 2020-01-15 DIAGNOSIS — I2510 Atherosclerotic heart disease of native coronary artery without angina pectoris: Secondary | ICD-10-CM | POA: Diagnosis present

## 2020-01-15 LAB — BASIC METABOLIC PANEL
Anion gap: 12 (ref 5–15)
BUN: 7 mg/dL — ABNORMAL LOW (ref 8–23)
CO2: 26 mmol/L (ref 22–32)
Calcium: 8.4 mg/dL — ABNORMAL LOW (ref 8.9–10.3)
Chloride: 105 mmol/L (ref 98–111)
Creatinine, Ser: 0.7 mg/dL (ref 0.61–1.24)
GFR calc Af Amer: >60 mL/min (ref >60)
GFR calc non Af Amer: >60 mL/min (ref >60)
Glucose, Bld: 79 mg/dL (ref 70–99)
Potassium: 2.9 mmol/L — ABNORMAL LOW (ref 3.5–5.1)
Sodium: 143 mmol/L (ref 135–145)

## 2020-01-15 LAB — CBC
HCT: 31.4 % — ABNORMAL LOW (ref 39.0–52.0)
Hemoglobin: 9.3 g/dL — ABNORMAL LOW (ref 13.0–17.0)
MCH: 20.7 pg — ABNORMAL LOW (ref 26.0–34.0)
MCHC: 29.6 g/dL — ABNORMAL LOW (ref 30.0–36.0)
MCV: 69.8 fL — ABNORMAL LOW (ref 80.0–100.0)
Platelets: 281 10*3/uL (ref 150–400)
RBC: 4.5 MIL/uL (ref 4.22–5.81)
RDW: 21.2 % — ABNORMAL HIGH (ref 11.5–15.5)
WBC: 5 10*3/uL (ref 4.0–10.5)
nRBC: 0 % (ref 0.0–0.2)

## 2020-01-15 LAB — IRON AND TIBC
Iron: 109 ug/dL (ref 45–182)
Saturation Ratios: 30 % (ref 17.9–39.5)
TIBC: 358 ug/dL (ref 250–450)
UIBC: 249 ug/dL

## 2020-01-15 LAB — POTASSIUM: Potassium: 3 mmol/L — ABNORMAL LOW (ref 3.5–5.1)

## 2020-01-15 LAB — GLUCOSE, CAPILLARY
Glucose-Capillary: 67 mg/dL — ABNORMAL LOW (ref 70–99)
Glucose-Capillary: 123 mg/dL — ABNORMAL HIGH (ref 70–99)
Glucose-Capillary: 58 mg/dL — ABNORMAL LOW (ref 70–99)
Glucose-Capillary: 88 mg/dL (ref 70–99)
Glucose-Capillary: 82 mg/dL (ref 70–99)

## 2020-01-15 LAB — MAGNESIUM: Magnesium: 2.2 mg/dL (ref 1.7–2.4)

## 2020-01-15 LAB — FERRITIN: Ferritin: 14 ng/mL — ABNORMAL LOW (ref 24–336)

## 2020-01-15 MED ORDER — BISACODYL 10 MG RE SUPP
10.0000 mg | Freq: Once | RECTAL | Status: AC
  Administered 2020-01-15: 10 mg via RECTAL
  Filled 2020-01-15: qty 1

## 2020-01-15 MED ORDER — POTASSIUM CHLORIDE 10 MEQ/100ML IV SOLN
10.0000 meq | INTRAVENOUS | Status: AC
  Administered 2020-01-15 – 2020-01-16 (×6): 10 meq via INTRAVENOUS
  Filled 2020-01-15 (×6): qty 100

## 2020-01-15 MED ORDER — POTASSIUM CHLORIDE 10 MEQ/100ML IV SOLN
10.0000 meq | INTRAVENOUS | Status: AC
  Administered 2020-01-15 (×6): 10 meq via INTRAVENOUS
  Filled 2020-01-15 (×2): qty 100

## 2020-01-15 MED ORDER — DEXTROSE 50 % IV SOLN
1.0000 | Freq: Once | INTRAVENOUS | Status: AC
  Administered 2020-01-15: 50 mL via INTRAVENOUS
  Filled 2020-01-15: qty 50

## 2020-01-15 MED ORDER — DEXTROSE 50 % IV SOLN
INTRAVENOUS | Status: AC
  Filled 2020-01-15: qty 50

## 2020-01-15 MED ORDER — DEXTROSE-NACL 5-0.9 % IV SOLN: INTRAVENOUS | Status: DC

## 2020-01-15 MED ORDER — DEXTROSE 50 % IV SOLN
12.5000 g | Freq: Once | INTRAVENOUS | Status: AC
  Administered 2020-01-15: 12.5 g via INTRAVENOUS

## 2020-01-15 NOTE — Consult Note (Addendum)
Referring Provider: Dr. Shelly Coss  Primary Care Physician:  [facility] Gastroenterologist:  Dr. Scarlette Shorts   Reason for Consultation:  Ileus   HPI: Justin Griffin is a 79 y.o. male with a past medical history significant for hypertension, rheumatoid arthritis, coronary artery disease status post CABG 2006, progressive Alzheimer's disease, GERD and esophageal stricture.  He presented to [facility] emergency room from [place] home on 01/14/2020 with a distended abdomen and diarrhea for the past 3 days.  He has Alzheimer with significant dementia.  He is nonverbal. No family at bedside to provide history. The ED physician communicated with the SNF nursing staff who reported the patient had several days of loose stools with abdominal distension which progressively worsened over the past few days. The nurse reported he sometimes opens his eyes, he is bed bound. He was reported to tolerate po intake when fed by the CMA, normally eats 100% of his meals.   An Abdominal/pelvic CT scan with IV contrast: showed markedly gas distended sigmoid colon without obstruction.  Gas distention of the transverse colon and large volume of stool in the ascending colon.  Findings are suggestive of colonic ileus/institutional bowel.  No evidence of intraperitoneal free fluid or air.  He was found to have hypokalemia which may also contribute to his ileus.   Labs 01/14/2020: Sodium 144.  Potassium 2.8.  Glucose 96.  BUN 11.  Creatinine 0.98.  Calcium 9.0.  Anion gap 9.  Magnesium 2.4.  Alk phos 62.  Albumin 3.8.  Lipase 20.  AST 20.  ALT 7.  Total bili 0.7.  WBC 6.6.  Hemoglobin 9.8.  Hematocrit 32.8.  MCV 69.5.  Platelet 302.  Labs 01/15/2020: Sodium 143.  Potassium 2.9.  BUN 7.  Creatinine 0.70.  Iron 109.  TIBC 358.  Ferritin 14.  WBC 5.0.  Hemoglobin 9.3.  Hematocrit 31.4.  MCV 69.8.  Platelet 281.  SARS Corona virus testing not done. I will contact [facility] to verify if he received Covid vaccination.   Abdominal x-ray 01/15/2020: Colonic ileus pattern with progressive sigmoid distension, now up to 18 cm in diameter  GI history:  He was last seen in our office by Dr. Henrene Pastor 06/15/2018 due to having dysphagia.  He underwent an EGD 12/21/2017. See results below.   EGD 12/21/2017: - Benign-appearing esophageal stenosis. Dilated with a Maloney dilator 73 [place].  - Normal stomach. - Normal examined duodenum. - No specimens collected.  EGD 07/29/2015: Esophagus revealed a benign peptic stricture at the gastroesophageal junction measuring approximately 14 mm in diameter. There was associated esophagitis. Stomach and duodenum were normal. Retroflexed views revealed a hiatal hernia. The scope was then withdrawn from the patient and the procedure completed. THERAPY: 52 then 54 French Maloney dilators were passed into the esophagus without resistance. Scant heme. Tolerated well  Colonoscopy 11/08/2013: Normal.  No colon polyps.  Past Medical History:  Diagnosis Date  . Alzheimer's disease ([place])   . CAD (coronary artery disease)    Catheterization 2006 LAD occluded, OM 95% stenosis followed by mid occlusion, first obtuse marginal occluded, right coronary artery diffuse moderate disease. LIMA to the LAD was patent. Saphenous vein to PDA and posterior lateral patent with diffuse luminal irregularities, saphenous vein graft to second obtuse marginal is patent, saphenous vein graft to the first obtuse marginal had 25% stenosis.  . Esophageal reflux   . Esophageal stricture   . Other and unspecified hyperlipidemia   .  Other dysphagia   . Rheumatoid arthritis(714.0)   . Status post dilation of esophageal narrowing   . Unspecified essential hypertension     Past Surgical History:  Procedure Laterality Date  . CATARACT EXTRACTION, BILATERAL    . CORONARY ARTERY BYPASS GRAFT  1996  . INGUINAL HERNIA REPAIR Right 04/05/2017   Procedure: OPEN  RIGHT INGUINAL HERNIA REPAIR;  Surgeon: Alphonsa Overall, MD;  Location: [facility];  Service: General;  Laterality: Right;  . INSERTION OF MESH Right 04/05/2017   Procedure: INSERTION OF MESH;  Surgeon: Alphonsa Overall, MD;  Location: [facility];  Service: General;  Laterality: Right;  . left digit amputation      Prior to Admission medications   Medication Sig Start Date End Date Taking? Authorizing Provider  aspirin EC 81 MG tablet Take 81 mg by mouth daily.   Yes [provider]  brimonidine-timolol (COMBIGAN) 0.2-0.5 % ophthalmic solution Place 1 drop into the left eye every 12 (twelve) hours.   Yes [provider]  brinzolamide (AZOPT) 1 % ophthalmic suspension Place 1 drop into the left eye 3 (three) times daily.   Yes [provider]  latanoprost (XALATAN) 0.005 % ophthalmic solution Place 1 drop into both eyes at bedtime.   Yes [provider]  NON FORMULARY Take 1 Can by mouth See admin instructions. Mighty Shake: Drink 1 can by mouth three times a day- 8:30 AM/11:30 AM/6 PM   Yes [provider]  pantoprazole (PROTONIX) 40 MG tablet Take 40 mg by mouth daily before breakfast.   Yes [provider]    Current Facility-Administered Medications  Medication Dose Route Frequency Provider Last Rate Last Admin  . 0.9 %  sodium chloride infusion   Intravenous Continuous Truddie Hidden, MD 100 mL/hr at 01/15/20 0926 New Bag at 01/15/20 0926  . acetaminophen (TYLENOL) tablet 650 mg  650 mg Oral Q6H PRN Truddie Hidden, MD       Or  . acetaminophen (TYLENOL) suppository 650 mg  650 mg Rectal Q6H PRN Truddie Hidden, MD      . brimonidine (ALPHAGAN) 0.2 % ophthalmic solution 1 drop  1 drop Left Eye BID Truddie Hidden, MD   1 drop at 01/15/20 0923   And  . timolol (TIMOPTIC) 0.5 % ophthalmic solution 1 drop  1 drop Left Eye BID Truddie Hidden, MD   1 drop at 01/15/20 0925  . brinzolamide (AZOPT) 1 % ophthalmic suspension 1 drop  1 drop Left Eye  TID Truddie Hidden, MD   1 drop at 01/15/20 0924  . heparin injection 5,000 Units  5,000 Units Subcutaneous Q12H Truddie Hidden, MD   5,000 Units at 01/15/20 [phone]  . labetalol (NORMODYNE) injection 10 mg  10 mg Intravenous Q2H PRN Opyd, Ilene Qua, MD   10 mg at 01/15/20 0619  . latanoprost (XALATAN) 0.005 % ophthalmic solution 1 drop  1 drop Both Eyes QHS Truddie Hidden, MD   1 drop at 01/14/20 2136  . sodium chloride flush (NS) 0.9 % injection 3 mL  3 mL Intravenous Q12H Truddie Hidden, MD        Allergies as of 01/14/2020 - Review Complete 01/14/2020  Allergen Reaction Noted  . Ace inhibitors Other (See Comments) 05/02/2019  . Lisinopril Other (See Comments) 05/02/2019  . Zocor [simvastatin] Other (See Comments) 05/03/2012    Family History  Problem Relation Age of Onset  . Heart disease Maternal Grandfather   . Prostate cancer Paternal Grandfather   . Heart disease Mother   .  Colon cancer Neg Hx   . Esophageal cancer Neg Hx   . Stomach cancer Neg Hx   . Pancreatic cancer Neg Hx   . Liver disease Neg Hx     Social History   Socioeconomic History  . Marital status: Widowed    Spouse name: Not on file  . Number of children: 4  . Years of education: 10  . Highest education level: Not on file  Occupational History  . Occupation: RETIRED  Tobacco Use  . Smoking status: Former Smoker    Years: 0.00    Types: Cigarettes    Quit date: 10/05/1994    Years since quitting: 25.2  . Smokeless tobacco: Never Used  Substance and Sexual Activity  . Alcohol use: No  . Drug use: No  . Sexual activity: Not on file  Other Topics Concern  . Not on file  Social History Narrative   Denies abuse and feel safe at home.   Fun: Watch TV.    Social Determinants of Health   Financial Resource Strain:   . Difficulty of Paying Living Expenses:   Food Insecurity:   . Worried About Charity fundraiser in the Last Year:   . Arboriculturist in the Last Year:   Transportation Needs:     . Film/video editor (Medical):   Marland Kitchen Lack of Transportation (Non-Medical):   Physical Activity:   . Days of Exercise per Week:   . Minutes of Exercise per Session:   Stress:   . Feeling of Stress :   Social Connections:   . Frequency of Communication with Friends and Family:   . Frequency of Social Gatherings with Friends and Family:   . Attends Religious Services:   . Active Member of Clubs or Organizations:   . Attends Archivist Meetings:   Marland Kitchen Marital Status:   Intimate Partner Violence:   . Fear of Current or Ex-Partner:   . Emotionally Abused:   Marland Kitchen Physically Abused:   . Sexually Abused:     Review of Systems:  Unable to obtain ROS as patient is nonconversant.   Physical Exam: Vital signs in last 24 hours: Temp:  [97.7 F (36.5 C)-98.4 F (36.9 C)] 97.8 F (36.6 C) (04/12 0542) Pulse Rate:  [67-76] 69 (04/12 0643) Resp:  [16-18] 16 (04/12 0542) BP: (159-181)/(82-98) 162/82 (04/12 0643) SpO2:  [99 %] 99 % (04/11 2126) Weight:  [66.4 kg] 66.4 kg (04/12 0542) Last BM Date: 01/14/20   General:  Ill appearing 79 year old male in NAD.  Head:  Normocephalic and atraumatic. Eyes:  No scleral icterus. Conjunctiva pink. Pupils equal and reactive to light.  Ears:  Normal auditory acuity. Nose:  No deformity, discharge or lesions. Mouth:  Absent dentition. No ulcers or lesions.  Neck:  Supple. No lymphadenopathy or thyromegaly.  Lungs:  Diminished breath sounds throughout. No crackles, wheezes or rhonchi.  Heart:  RRR, no murmur.  Abdomen:  Distended, firm, few bowel sounds to the RUQ and RLQ. A small smear of liquid brown stool on the bed pad.  Rectal: No stool in the rectal vault.  Musculoskeletal:  Symmetrical without gross deformities.  Pulses:  Normal pulses noted. Extremities:  Trace edema to the feet bilaterally.  Neurologic:  Spontaneously moves UEs. Does not follow commands. Nonverbal.  Skin:  Intact without significant lesions or rashes. Psych:   Does not appear anxious.   Intake/Output from previous day: 04/11 0701 - 04/12 0700 In: 2357.5 [I.V.:1798.8; IV  Piggyback:558.7] Out: 2800 [Urine:2800] Intake/Output this shift: Total I/O In: 399.1 [I.V.:399.1] Out: 400 [Urine:400]  Lab Results: Recent Labs    01/14/20 0127 01/15/20 0444  WBC 6.6 5.0  HGB 9.8* 9.3*  HCT 32.8* 31.4*  PLT 302 281   BMET Recent Labs    01/14/20 1411 01/14/20 2145 01/15/20 0444  NA 143 143 143  K 3.0* 2.9* 2.9*  CL 107 108 105  CO2 _0 GLUCOSE 88 90 79  BUN 7* 7* 7*  CREATININE 0.78 0.70 0.70  CALCIUM 8.4* 8.4* 8.4*   LFT Recent Labs    01/14/20 0127  PROT 8.1  ALBUMIN 3.8  AST 20  ALT 7  ALKPHOS 62  BILITOT 0.7   PT/INR No results for input(s): LABPROT, INR in the last 72 hours. Hepatitis Panel No results for input(s): HEPBSAG, HCVAB, HEPAIGM, HEPBIGM in the last 72 hours.    Studies/Results: DG Abd 1 View  Result Date: 01/15/2020 CLINICAL DATA:  Ileus EXAM: ABDOMEN - 1 VIEW COMPARISON:  Abdominal CT from yesterday FINDINGS: Generalized gaseous distension of colon. The sigmoid segment is maximally dilated and is 18 cm in diameter. There is no obstructive process or twist on preceding abdominal CT. IMPRESSION: Colonic ileus pattern with progressive sigmoid distension, now up to 18 cm in diameter. Electronically Signed   By: Monte Fantasia M.D.   On: 01/15/2020 06:21   CT Abdomen Pelvis W Contrast  Result Date: 01/14/2020 CLINICAL DATA:  Distended abdomen. EXAM: CT ABDOMEN AND PELVIS WITH CONTRAST TECHNIQUE: Multidetector CT imaging of the abdomen and pelvis was performed using the standard protocol following bolus administration of intravenous contrast. CONTRAST:  161m OMNIPAQUE IOHEXOL 300 MG/ML  SOLN COMPARISON:  Plain film same day plain film 03/21/2019 FINDINGS: Lower chest: Lung bases are clear. Hepatobiliary: No focal hepatic lesion. No biliary duct dilatation. Gallbladder is normal. Common bile duct is normal.  Pancreas: Pancreas is normal. No ductal dilatation. No pancreatic inflammation. Spleen: Normal spleen Adrenals/urinary tract: Adrenal glands and kidneys are normal. The ureters and bladder normal. Stomach/Bowel: The stomach is compressed posteriorly by the dilated loops of colon. Small bowel is grossly normal. No evidence of small bowel dilatation. Cecum is stool filled. Ascending colon is stool filled. The transverse colon is markedly gas distended up to 8.4 cm. The proximal descending colon relatively collapsed. The sigmoid colon is markedly dilated up to 9.3 cm. There is no twisting of the sigmoid colon. The distended sigmoid colon extends to the rectum with there is fluid in the rectum. No obstructing lesion identified. No evidence of twisting of the sigmoid colon to suggest volvulus. No intraperitoneal free air. Vascular/Lymphatic: Abdominal aorta is normal caliber with atherosclerotic calcification. There is no retroperitoneal or periportal lymphadenopathy. No pelvic lymphadenopathy. Reproductive: Prostate normal Other: No intraperitoneal free air no free fluid. Musculoskeletal: No aggressive osseous lesion. IMPRESSION: Markedly gas distended sigmoid colon without obstruction lesion identified. Gas distension of the transverse colon and large volume stool in the ascending colon. Findings are most suggestive severe colonic ileus/institutional bowel. No intraperitoneal free air or fluid. Electronically Signed   By: SSuzy BouchardM.D.   On: 01/14/2020 05:53   DG Abd Portable 2 Views  Result Date: 01/14/2020 CLINICAL DATA:  Abdominal distension. EXAM: PORTABLE ABDOMEN - 2 VIEW COMPARISON:  March 21, 2019 FINDINGS: Multiple markedly dilated, air-filled loops of large bowel are seen. This extends from the cecum to the rectum, with a mild to moderate amount of stool seen within the proximal to mid  ascending colon. The small bowel loops are subsequently limited in visualization. There is no evidence of free air.  No radio-opaque calculi or other significant radiographic abnormality is seen. IMPRESSION: Multiple markedly dilated, air-filled loops of large bowel, as described above. While this may be chronic in nature, a distal large bowel obstruction cannot be excluded. Correlation with abdomen pelvis CT is recommended. Electronically Signed   By: Virgina Norfolk M.D.   On: 01/14/2020 03:05    IMPRESSION/PLAN:  70. 79 year old male with progressive Alzheimer's disease admitted to [facility] from [facility] on 4/11 with diarrhea and abdominal distension. 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 Labs in the ED showed hypokalemia ( K+ 2.8 - 2.9 ) which is most likely contributing to his ileus. Patient is a DNR.  -Potassium and magnesium replacement per the Hospitalist -Dulcolax 41m suppository x 1 now -Turn patient Q 2 hours -Consider rectal tube and flexible sigmoidoscopy for decompression if no improvement  -NPO  -Continue NS IV @ 100cc/hr  -Repeat abdominal xray in am  -Supportive care   2. History of a benign esophageal stenosis, last dilated at the time of EGD in 2019  3. Microcytic Anemia. Hg 9.3. HCT 31.4. MCV 69.8. Iron 109. Ferritin 14.  No signs of active GI bleeding.   4. History of CAD, s/p CABG 2006  Further recommendations per Dr. NKarma LewMDorathy Daft 01/15/2020, 10:50 AM    Attending physician's note   I have taken a history, examined the patient and reviewed the chart. I agree with the Advanced Practitioner's note, impression and recommendations.  On exam abdomen is distended and tympanic. Patient is non verbal and non communicative.   CT abd & pelvis with no sigmoid volvulus, significant distention of sigmoid and transverse colon with large volume of stool in the  ascending colon.  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 VDenzil Magnuson, MD [phone]

## 2020-01-15 NOTE — Progress Notes (Signed)
PT Cancellation Note / Screen  Patient Details Name: Justin Griffin MRN: [id number] DOB: May 30, 1941   Cancelled Treatment:    Reason Eval/Treat Not Completed: PT screened, no needs identified, will sign off  Pt from SNF and to return to SNF. Discussed with OT, and no skilled PT needs at this time as pt is total care.  PT to sign off.   Chamille Werntz,KATHrine E 01/15/2020, 11:13 AM Arlyce Dice, DPT Acute Rehabilitation Services Office: [phone]

## 2020-01-15 NOTE — Progress Notes (Addendum)
Hypoglycemic Event  CBG: 67  Treatment: Verbal order of Ample of D50  Symptoms: None  Follow-up CBG: Time: CBG Result  Possible Reasons for Event: Patient has been NPO  Comments/MD notified:Khan    Shaundrea Carrigg

## 2020-01-15 NOTE — Progress Notes (Addendum)
PROGRESS NOTE    KYLYN WANTZ  [id number] DOB: Jan 03, 1941 DOA: 01/14/2020 PCP: [facility] [place]   Brief Narrative:  Patient is a 79 year old male with history of Alzheimer's disease/advanced dementia, hypertension, hyperlipidemia, coronary disease, GERD, arthritis who presents from [facility] skilled nursing facility with complaints of abdominal distention, diarrhea.  Patient is nonverbal at baseline.  Staff at nursing facility reported worsening abdominal distention over the past few days, having loose stools.  He is bedbound at baseline.  Does not follow any commands but he spontaneously moves his arms and legs.  He is DNI/DNR and mostly on comfort measures but not officially hospice patient. Found to have severe hypokalemia on presentation.   Assessment & Plan:   Active Problems:   Ileus ([place])   Colonic ileus: Likely associated with hypokalemia.  Presented with abdominal distention, diarrhea.  Currently n.p.o.  Patient had a bowel movement with formed stool after admission.  Continue conservative management.  NG tube was not placed on admission. Abd xray done this AM showed colonic ileus pattern with progressive sigmoid distension, now up to 18 cm in diameter. CT abdomen on presentation has shown markedly gas distended sigmoid colon without obstruction lesion identified. Plan to repeat abdominal x-ray tomorrow.  GI considering rectal tube/flexible sigmoidoscopy for decompression if no improvement.  Continue IV fluids.  Hypokalemia: Most likely the contributing factor for ileus.  Will aggressively supplement.  Will check magnesium.  As per report, he was having diarrhea at the nursing facility which could have contributed to hypokalemia.  Advanced dementia: Nonverbal, does not follow commands.  Continue supportive care  Microcytic anemia: Currently hemoglobin stable at 9.  Normal iron, low ferritin.  No signs of any bleeding.  Hypertension:  Hypertensive this morning.  We will continue as needed medications.  Currently not on any medications at facility.  History of coronary artery disease: On aspirin.  Status post CABG in 2006.  History of GERD: On Protonix  Goals of care: DNI/DNR.  He has been largely managed in a comfort care approach but not officially hospice.  As per discussion with the family on admission, they would like to continue medical treatment.           DVT prophylaxis:[place] Heparin Code Status: Full Family Communication: Discussed in detail with daughter on phone on 01/15/2020. Status is: Inpatient Dispo: The patient is from: SNF              Anticipated d/c is to: SNF              Anticipated d/c date is: 2 days              Patient currently is not medically stable to d/c.        Consultants: GI  Procedures:None Antimicrobials:  Anti-infectives (From admission, onward)   None      Subjective: Patient seen and examined at the bedside this morning.  Hemodynamically stable.  Not in apparent distress.  Remains nonverbal, bedbound.  Found to have distended abdomen.  Objective: Vitals:   01/14/20 1338 01/14/20 2126 01/15/20 0542 01/15/20 0643  BP: (!) 159/96 (!) 168/91 (!) 181/98 (!) 162/82  Pulse: 76 67 70 69  Resp: 18 18 16    Temp: 97.7 F (36.5 C) 98.4 F (36.9 C) 97.8 F (36.6 C)   TempSrc: Oral Oral Oral   SpO2:  99%    Weight:   66.4 kg     Intake/Output Summary (Last 24 hours) at 01/15/2020  [id number] Last data filed at 01/15/2020 0600 Gross per 24 hour  Intake 2357.49 ml  Output 2800 ml  Net -442.51 ml   Filed Weights   01/15/20 0542  Weight: 66.4 kg    Examination:  General exam: Extremely deconditioned elderly male HEENT: Eyes closed  respiratory system: Bilateral equal air entry, normal vesicular breath sounds, no wheezes or crackles  Cardiovascular system: S1 & S2 heard, RRR. No JVD, murmurs, rubs, gallops or clicks. No pedal edema. Gastrointestinal system: Abdomen is  distended, has mild generalized tenderness.  Slow bowel sounds  Central nervous system: Not Alert and oriented Extremities: No edema, no clubbing ,no cyanosis Skin: No rashes, lesions or ulcers,no icterus ,no pallor   Data Reviewed: I have personally reviewed following labs and imaging studies  CBC: Recent Labs  Lab 01/14/20 0127 01/15/20 0444  WBC 6.6 5.0  HGB 9.8* 9.3*  HCT 32.8* 31.4*  MCV 69.5* 69.8*  PLT 302 [id number]   Basic Metabolic Panel: Recent Labs  Lab 01/14/20 0127 01/14/20 1411 01/14/20 2145  NA 144 143 143  K 2.8* 3.0* 2.9*  CL 107 107 108  CO2 28 29 27   GLUCOSE 96 88 90  BUN 11 7* 7*  CREATININE 0.98 0.78 0.70  CALCIUM 9.0 8.4* 8.4*  MG 2.4 2.1  --    GFR: CrCl cannot be calculated (Unknown ideal weight.). Liver Function Tests: Recent Labs  Lab 01/14/20 0127  AST 20  ALT 7  ALKPHOS 62  BILITOT 0.7  PROT 8.1  ALBUMIN 3.8   Recent Labs  Lab 01/14/20 0127  LIPASE 24   No results for input(s): AMMONIA in the last 168 hours. Coagulation Profile: No results for input(s): INR, PROTIME in the last 168 hours. Cardiac Enzymes: No results for input(s): CKTOTAL, CKMB, CKMBINDEX, TROPONINI in the last 168 hours. BNP (last 3 results) No results for input(s): PROBNP in the last 8760 hours. HbA1C: No results for input(s): HGBA1C in the last 72 hours. CBG: Recent Labs  Lab 01/14/20 1753 01/14/20 2340 01/15/20 0509 01/15/20 0643  GLUCAP 69* 74 67* 123*   Lipid Profile: No results for input(s): CHOL, HDL, LDLCALC, TRIG, CHOLHDL, LDLDIRECT in the last 72 hours. Thyroid Function Tests: No results for input(s): TSH, T4TOTAL, FREET4, T3FREE, THYROIDAB in the last 72 hours. Anemia Panel: No results for input(s): VITAMINB12, FOLATE, FERRITIN, TIBC, IRON, RETICCTPCT in the last 72 hours. Sepsis Labs: No results for input(s): PROCALCITON, LATICACIDVEN in the last 168 hours.  No results found for this or any previous visit (from the past 240 hour(s)).        Radiology Studies: DG Abd 1 View  Result Date: 01/15/2020 CLINICAL DATA:  Ileus EXAM: ABDOMEN - 1 VIEW COMPARISON:  Abdominal CT from yesterday FINDINGS: Generalized gaseous distension of colon. The sigmoid segment is maximally dilated and is 18 cm in diameter. There is no obstructive process or twist on preceding abdominal CT. IMPRESSION: Colonic ileus pattern with progressive sigmoid distension, now up to 18 cm in diameter. Electronically Signed   By: Monte Fantasia M.D.   On: 01/15/2020 06:21   CT Abdomen Pelvis W Contrast  Result Date: 01/14/2020 CLINICAL DATA:  Distended abdomen. EXAM: CT ABDOMEN AND PELVIS WITH CONTRAST TECHNIQUE: Multidetector CT imaging of the abdomen and pelvis was performed using the standard protocol following bolus administration of intravenous contrast. CONTRAST:  16mL OMNIPAQUE IOHEXOL 300 MG/ML  SOLN COMPARISON:  Plain film same day plain film 03/21/2019 FINDINGS: Lower chest: Lung bases are clear. Hepatobiliary: No focal  hepatic lesion. No biliary duct dilatation. Gallbladder is normal. Common bile duct is normal. Pancreas: Pancreas is normal. No ductal dilatation. No pancreatic inflammation. Spleen: Normal spleen Adrenals/urinary tract: Adrenal glands and kidneys are normal. The ureters and bladder normal. Stomach/Bowel: The stomach is compressed posteriorly by the dilated loops of colon. Small bowel is grossly normal. No evidence of small bowel dilatation. Cecum is stool filled. Ascending colon is stool filled. The transverse colon is markedly gas distended up to 8.4 cm. The proximal descending colon relatively collapsed. The sigmoid colon is markedly dilated up to 9.3 cm. There is no twisting of the sigmoid colon. The distended sigmoid colon extends to the rectum with there is fluid in the rectum. No obstructing lesion identified. No evidence of twisting of the sigmoid colon to suggest volvulus. No intraperitoneal free air. Vascular/Lymphatic: Abdominal aorta  is normal caliber with atherosclerotic calcification. There is no retroperitoneal or periportal lymphadenopathy. No pelvic lymphadenopathy. Reproductive: Prostate normal Other: No intraperitoneal free air no free fluid. Musculoskeletal: No aggressive osseous lesion. IMPRESSION: Markedly gas distended sigmoid colon without obstruction lesion identified. Gas distension of the transverse colon and large volume stool in the ascending colon. Findings are most suggestive severe colonic ileus/institutional bowel. No intraperitoneal free air or fluid. Electronically Signed   By: Suzy Bouchard M.D.   On: 01/14/2020 05:53   DG Abd Portable 2 Views  Result Date: 01/14/2020 CLINICAL DATA:  Abdominal distension. EXAM: PORTABLE ABDOMEN - 2 VIEW COMPARISON:  March 21, 2019 FINDINGS: Multiple markedly dilated, air-filled loops of large bowel are seen. This extends from the cecum to the rectum, with a mild to moderate amount of stool seen within the proximal to mid ascending colon. The small bowel loops are subsequently limited in visualization. There is no evidence of free air. No radio-opaque calculi or other significant radiographic abnormality is seen. IMPRESSION: Multiple markedly dilated, air-filled loops of large bowel, as described above. While this may be chronic in nature, a distal large bowel obstruction cannot be excluded. Correlation with abdomen pelvis CT is recommended. Electronically Signed   By: Virgina Norfolk M.D.   On: 01/14/2020 03:05        Scheduled Meds: . brimonidine  1 drop Left Eye BID   And  . timolol  1 drop Left Eye BID  . brinzolamide  1 drop Left Eye TID  . heparin  5,000 Units Subcutaneous Q12H  . latanoprost  1 drop Both Eyes QHS  . sodium chloride flush  3 mL Intravenous Q12H   Continuous Infusions: . sodium chloride 100 mL/hr at 01/14/20 2336     LOS: 0 days    Time spent:35 mins. More than 50% of that time was spent in counseling and/or coordination of  care.      Shelly Coss, MD [facility] P4/09/2020, 8:10 AM

## 2020-01-15 NOTE — Progress Notes (Signed)
Patients blood sugar checked at 1125 and was 58. Patient given 1 ampule of D50 and blood sugar was rechecked at 1208 and was 88. MD notified. Daughter called and updated asked to speak with MD. MD notified.

## 2020-01-15 NOTE — Evaluation (Signed)
Occupational Therapy Evaluation and Discharge Patient Details Name: Justin Griffin MRN: [id number] DOB: Jan 03, 1941 Today's Date: 01/15/2020    History of Present Illness Justin Griffin is a 79 y.o. male with medical history significant of Alzheimer's disease, hypertension, hyperlipidemia, CAD, GERD, and rheumatoid arthritis who presents from his nursing home with abdominal distention and diarrhea, noted to have distended sigmoid colon without obstructing lesion noted concerning for colonic ileus.  Patient is nonverbal at baseline   Clinical Impression   This 79 yo male admitted with above presents to acute OT per chart being non-verbal, bed bound, total A for basic ADLs (including feeding), does not follow commands. Pt presented to me as stated in chart. Assessed for any postioning needs for UEs, LEs, and neck. At this time pt does not need anything for positioning (his PROM is adequate for basic ADLs) and he moves spontaneously with more fluidity. I did place a pillow under his calfs so as to suspend his heels off of the bed.    Follow Up Recommendations  No OT follow up;Supervision/Assistance - 24 hour    Equipment Recommendations  None recommended by OT       Precautions / Restrictions Precautions Precautions: Fall Restrictions Weight Bearing Restrictions: No             ADL either performed or assessed with clinical judgement   ADL                                         General ADL Comments: total A     Vision Baseline Vision/History: (kept eyes closed entire session)              Pertinent Vitals/Pain Pain Assessment: Faces Faces Pain Scale: No hurt(no signs of pain with PROM of ARMS/LEGs)     Hand Dominance (unknown)   Extremity/Trunk Assessment Upper Extremity Assessment Upper Extremity Assessment: (pt with stiff PROM, spontaneous AROM more normal. Has adequate PROM for proper hygiene (getting under arms). Hands not contracted--noted OA)    Lower Extremity Assessment Lower Extremity Assessment: (Pt with feet in nuetral position. Limited PROM for dorsi and plantar flexion. Stiff throught LEs)   Cervical / Trunk Assessment Cervical / Trunk Assessment: (Only minimal PROM at neck)   Communication Communication Communication: Other (comment)(non-verbal)   Cognition Arousal/Alertness: Lethargic Behavior During Therapy: Flat affect Overall Cognitive Status: History of cognitive impairments - at baseline                                                [place] expects to be discharged to:: Skilled nursing facility                                        Prior Functioning/Environment          Comments: Per chart total A for all mobility, basic ADLs (including eating), non-verbal, and does not follow commands        OT Problem List: Decreased range of motion;Decreased cognition         OT Goals(Current goals can be found in the care plan section) Acute Rehab OT Goals Patient Stated Goal: unable to state (non-verbal); no  family in room OT Goal Formulation: Patient unable to participate in goal setting  OT Frequency:                AM-PAC OT "6 Clicks" Daily Activity     Outcome Measure Help from another person eating meals?: Total Help from another person taking care of personal grooming?: Total Help from another person toileting, which includes using toliet, bedpan, or urinal?: Total Help from another person bathing (including washing, rinsing, drying)?: Total Help from another person to put on and taking off regular upper body clothing?: Total Help from another person to put on and taking off regular lower body clothing?: Total 6 Click Score: 6   End of Session    Activity Tolerance: Patient limited by lethargy(v. alzheimer's) Patient left: in bed  OT Visit Diagnosis: Other symptoms and signs involving cognitive function;Cognitive communication deficit  (R41.841) Symptoms and signs involving cognitive functions: (Alzheimer's)                Time: 0950-1002 OT Time Calculation (min): 12 min Charges:  OT General Charges $OT Visit: 1 Visit OT Evaluation $OT Eval Low Complexity: 1 Low  [place], OTR/L Acute [employer] Pager [phone] Office [phone]    Almon Register 01/15/2020, 11:03 AM

## 2020-01-15 NOTE — Progress Notes (Signed)
Verbal order by MD Humphrey Rolls of ample of D50.

## 2020-01-16 ENCOUNTER — Inpatient Hospital Stay (HOSPITAL_COMMUNITY): Payer: Medicare (Managed Care)

## 2020-01-16 DIAGNOSIS — K5981 Ogilvie syndrome: Secondary | ICD-10-CM

## 2020-01-16 DIAGNOSIS — R140 Abdominal distension (gaseous): Secondary | ICD-10-CM | POA: Diagnosis not present

## 2020-01-16 DIAGNOSIS — E876 Hypokalemia: Secondary | ICD-10-CM | POA: Diagnosis not present

## 2020-01-16 DIAGNOSIS — K567 Ileus, unspecified: Secondary | ICD-10-CM | POA: Diagnosis not present

## 2020-01-16 LAB — CBC WITH DIFFERENTIAL/PLATELET
Abs Immature Granulocytes: 0.02 10*3/uL (ref 0.00–0.07)
Basophils Absolute: 0 10*3/uL (ref 0.0–0.1)
Basophils Relative: 1 %
Eosinophils Absolute: 0.1 10*3/uL (ref 0.0–0.5)
Eosinophils Relative: 1 %
HCT: 32.1 % — ABNORMAL LOW (ref 39.0–52.0)
Hemoglobin: 9.5 g/dL — ABNORMAL LOW (ref 13.0–17.0)
Immature Granulocytes: 0 %
Lymphocytes Relative: 30 %
Lymphs Abs: 1.9 10*3/uL (ref 0.7–4.0)
MCH: 20.7 pg — ABNORMAL LOW (ref 26.0–34.0)
MCHC: 29.6 g/dL — ABNORMAL LOW (ref 30.0–36.0)
MCV: 70.1 fL — ABNORMAL LOW (ref 80.0–100.0)
Monocytes Absolute: 0.8 10*3/uL (ref 0.1–1.0)
Monocytes Relative: 12 %
Neutro Abs: 3.6 10*3/uL (ref 1.7–7.7)
Neutrophils Relative %: 56 %
Platelets: 273 10*3/uL (ref 150–400)
RBC: 4.58 MIL/uL (ref 4.22–5.81)
RDW: 21.4 % — ABNORMAL HIGH (ref 11.5–15.5)
WBC: 6.5 10*3/uL (ref 4.0–10.5)
nRBC: 0 % (ref 0.0–0.2)

## 2020-01-16 LAB — BASIC METABOLIC PANEL
Anion gap: 10 (ref 5–15)
BUN: 6 mg/dL — ABNORMAL LOW (ref 8–23)
CO2: 25 mmol/L (ref 22–32)
Calcium: 8.9 mg/dL (ref 8.9–10.3)
Chloride: 107 mmol/L (ref 98–111)
Creatinine, Ser: 0.74 mg/dL (ref 0.61–1.24)
GFR calc Af Amer: >60 mL/min (ref >60)
GFR calc non Af Amer: >60 mL/min (ref >60)
Glucose, Bld: 94 mg/dL (ref 70–99)
Potassium: 3.5 mmol/L (ref 3.5–5.1)
Sodium: 142 mmol/L (ref 135–145)

## 2020-01-16 LAB — PHOSPHORUS: Phosphorus: 2 mg/dL — ABNORMAL LOW (ref 2.5–4.6)

## 2020-01-16 LAB — MAGNESIUM: Magnesium: 2 mg/dL (ref 1.7–2.4)

## 2020-01-16 MED ORDER — POTASSIUM CHLORIDE 10 MEQ/100ML IV SOLN
10.0000 meq | INTRAVENOUS | Status: AC
  Administered 2020-01-16 (×5): 10 meq via INTRAVENOUS
  Filled 2020-01-16 (×4): qty 100

## 2020-01-16 MED ORDER — POTASSIUM PHOSPHATES 15 MMOLE/5ML IV SOLN
20.0000 mmol | Freq: Once | INTRAVENOUS | Status: AC
  Administered 2020-01-16: 20 mmol via INTRAVENOUS
  Filled 2020-01-16: qty 6.67

## 2020-01-16 NOTE — Progress Notes (Addendum)
PROGRESS NOTE    Justin Griffin  [id number] DOB: December 17, 1940 DOA: 01/14/2020 PCP: [facility] [place]   Brief Narrative:  Patient is a 79 year old male with history of Alzheimer's disease/advanced dementia, hypertension, hyperlipidemia, coronary disease, GERD, arthritis who presents from [facility] skilled nursing facility with complaints of abdominal distention, diarrhea.  Patient is nonverbal at baseline.  Staff at nursing facility reported worsening abdominal distention over the past few days, having loose stools.  He is bedbound at baseline.  Does not follow any commands but he spontaneously moves his arms and legs.  He is DNI/DNR and mostly on comfort measures but not officially hospice patient. Found to have severe hypokalemia on presentation.  Abdominal imaging showed ileus.  Admitted for the management of Ogilvie's syndrome/colonic pseudoobstruction.  GI following.  Assessment & Plan:   Active Problems:   Ileus ([place])   Colonic ileus: Likely associated with hypokalemia.  Presented with abdominal distention.   Abd xray on 01/15/20 showed colonic ileus pattern with progressive sigmoid distension,up to 18 cm in diameter. CT abdomen on presentation has shown markedly gas distended sigmoid colon without obstruction lesion identified. Abdominal x-ray done this morning showed some improvement. He had a large bowel movement yesterday afternoon and again last night.  His abdomen still remains distended.  Bowel sounds present. GI considering rectal tube/flexible sigmoidoscopy for decompression if no improvement.   Continue gentle  IV fluids.  Hypokalemia/hypophosphatemia: Most likely the contributing factor for ileus.  Will aggressively supplement.  Magnesium normal.  As per report, he was having diarrhea at the nursing facility which could have contributed to hypokalemia. Potassium was 3.5 today.  Will again supplement.  Phosphorus low, will supplement.  Advanced  dementia: Nonverbal, does not follow commands.  Continue supportive care.  Microcytic anemia: Currently hemoglobin stable at 9.  Normal iron, low ferritin.  No signs of any bleeding.  Continue to monitor  Hypertension: Hypertensive this morning.  We will continue as needed medications.  Currently not on any medications at facility.  Might need antihypertensive medications on discharge.  History of coronary artery disease: On aspirin.  Status post CABG in 2006.  History of GERD: On Protonix  Goals of care: DNI/DNR.  He has been largely managed in a comfort care approach but not officially hospice.  As per discussion with the family on admission, they would like to continue medical treatment for now. I will consult palliative  care.           DVT prophylaxis:[place] Heparin Code Status: Full Family Communication: Discussed in detail with daughter on phone on 01/15/2020. Status is: Inpatient Dispo: The patient is from: SNF              Anticipated d/c is to: SNF              Anticipated d/c date is: 2 days              Patient currently is not medically stable to d/c.  Abdomen is still distended, needs GI clearance before discharge.  Palliative care also consulted.        Consultants: GI  Procedures:None Antimicrobials:  Anti-infectives (From admission, onward)   None      Subjective:  Patient seen and examined at the bedside this morning.  Hemodynamically stable.  Remains nonverbal, bedbound.  Abdomen is still distended but has bowel sounds.  Not in any kind of distress.  Objective: Vitals:   01/15/20 1324 01/15/20 2204 01/16/20 0545 01/16/20 0600  BP: (!) 162/89 (!) 166/80 (!) 154/116   Pulse: 64 (!) 56 73   Resp: 14 16 16    Temp: (!) 97.4 F (36.3 C) 98.3 F (36.8 C) 97.9 F (36.6 C)   TempSrc: Oral Oral Oral   SpO2:  (!) 87% (!) 84% 97%  Weight:        Intake/Output Summary (Last 24 hours) at 01/16/2020 0751 Last data filed at 01/16/2020 0548 Gross per 24 hour   Intake 2513.63 ml  Output 2600 ml  Net -86.37 ml   Filed Weights   01/15/20 0542  Weight: 66.4 kg    Examination:  General exam: Lying in bed, extremely deconditioned, debilitated elderly male Respiratory system:no wheezes or crackles  Cardiovascular system: S1 & S2 heard, RRR. No JVD, murmurs, rubs, gallops or clicks. Gastrointestinal system: Abdomen is distended, soft and nontender. Normal bowel sounds heard. Central nervous system: Not Alert and oriented.  Extremities: No edema, no clubbing ,no cyanosis Skin: No rashes, lesions or ulcers,no icterus ,no pallor   Data Reviewed: I have personally reviewed following labs and imaging studies  CBC: Recent Labs  Lab 01/14/20 0127 01/15/20 0444  WBC 6.6 5.0  HGB 9.8* 9.3*  HCT 32.8* 31.4*  MCV 69.5* 69.8*  PLT 302 [id number]   Basic Metabolic Panel: Recent Labs  Lab 01/14/20 0127 01/14/20 0127 01/14/20 1411 01/14/20 2145 01/15/20 0444 01/15/20 1911 01/16/20 0622  NA 144  --  143 143 143  --  142  K 2.8*   < > 3.0* 2.9* 2.9* 3.0* 3.5  CL 107  --  107 108 105  --  107  CO2 28  --  29 27 26   --  25  GLUCOSE 96  --  88 90 79  --  94  BUN 11  --  7* 7* 7*  --  6*  CREATININE 0.98  --  0.78 0.70 0.70  --  0.74  CALCIUM 9.0  --  8.4* 8.4* 8.4*  --  8.9  MG 2.4  --  2.1  --  2.2  --  2.0  PHOS  --   --   --   --   --   --  2.0*   < > = values in this interval not displayed.   GFR: CrCl cannot be calculated (Unknown ideal weight.). Liver Function Tests: Recent Labs  Lab 01/14/20 0127  AST 20  ALT 7  ALKPHOS 62  BILITOT 0.7  PROT 8.1  ALBUMIN 3.8   Recent Labs  Lab 01/14/20 0127  LIPASE 24   No results for input(s): AMMONIA in the last 168 hours. Coagulation Profile: No results for input(s): INR, PROTIME in the last 168 hours. Cardiac Enzymes: No results for input(s): CKTOTAL, CKMB, CKMBINDEX, TROPONINI in the last 168 hours. BNP (last 3 results) No results for input(s): PROBNP in the last 8760  hours. HbA1C: No results for input(s): HGBA1C in the last 72 hours. CBG: Recent Labs  Lab 01/15/20 0509 01/15/20 0643 01/15/20 1125 01/15/20 1208 01/15/20 1706  GLUCAP 67* 123* 58* 88 82   Lipid Profile: No results for input(s): CHOL, HDL, LDLCALC, TRIG, CHOLHDL, LDLDIRECT in the last 72 hours. Thyroid Function Tests: No results for input(s): TSH, T4TOTAL, FREET4, T3FREE, THYROIDAB in the last 72 hours. Anemia Panel: Recent Labs    01/15/20 0444  FERRITIN 14*  TIBC 358  IRON 109   Sepsis Labs: No results for input(s): PROCALCITON, LATICACIDVEN in the last 168 hours.  No results found for  this or any previous visit (from the past 240 hour(s)).       Radiology Studies: DG Abd 1 View  Result Date: 01/15/2020 CLINICAL DATA:  Ileus EXAM: ABDOMEN - 1 VIEW COMPARISON:  Abdominal CT from yesterday FINDINGS: Generalized gaseous distension of colon. The sigmoid segment is maximally dilated and is 18 cm in diameter. There is no obstructive process or twist on preceding abdominal CT. IMPRESSION: Colonic ileus pattern with progressive sigmoid distension, now up to 18 cm in diameter. Electronically Signed   By: Monte Fantasia M.D.   On: 01/15/2020 06:21        Scheduled Meds: . brimonidine  1 drop Left Eye BID   And  . timolol  1 drop Left Eye BID  . brinzolamide  1 drop Left Eye TID  . heparin  5,000 Units Subcutaneous Q12H  . latanoprost  1 drop Both Eyes QHS  . sodium chloride flush  3 mL Intravenous Q12H   Continuous Infusions: . dextrose 5 % and 0.9% NaCl 75 mL/hr at 01/16/20 0316  . potassium chloride    . potassium PHOSPHATE IVPB (in mmol)       LOS: 1 day    Time spent:35 mins. More than 50% of that time was spent in counseling and/or coordination of care.      Shelly Coss, MD [facility] P4/13/2021, 7:51 AM

## 2020-01-16 NOTE — Progress Notes (Signed)
[facility] Progress Note  CC:  Colonic ileus   Subjective:  Patient is nonverbal. 68 RN reported patient had a large loose BM yesterday evening and last night.   I called the patient's daughter to provide an update. I discussed her father's exam and abdomina xray showed slight improvement. He is resting comfortably in no distress. He remains unresponsive, non-conversant. Some spontaneous movement of his upper extremities. I discussed the possible need for a rectal tube or flexible sigmoidoscopy for decompression if no further improvement. I discussed the benefits and risks of a flex sig including risks with sedation, risk of bleeding, infection and perforation. Nutrition is also a concern as he remains NPO and he is not awake to attempt po intake. Justin Griffin wishes to communicate with other family members before making any further medical decisions.    Objective:   KUB 01/16/2020: Sigmoid distension, slightly less prominent than 1 day prior. Suspect colonic ileus as most likely etiology, although patient is at risk for sigmoid volvulus. Close clinical and imaging surveillance advised in this regard. No free air evident.   Vital signs in last 24 hours: Temp:  [97.4 F (36.3 C)-98.3 F (36.8 C)] 97.9 F (36.6 C) (04/13 0545) Pulse Rate:  [56-73] 73 (04/13 0545) Resp:  [14-16] 16 (04/13 0545) BP: (154-166)/(80-116) 154/116 (04/13 0545) SpO2:  [84 %-97 %] 97 % (04/13 0600) Last BM Date: 01/15/20 General:  Patient is unresponsive. Does not awaken to name called.  Heart: RRR, no murmur.  Pulm:  Breath sounds diminished throughout.  Abdomen: Slightly less distended when compared to yesterday's exam. Abdomen remains distended, hypoactive BS x 4 quads. Smear of brown liquid stool on bed pad.  Extremities:  1+ edema to ankles and feet.  Neurologic:  Unresponsive. He is moving his UEs spontaneously.    Intake/Output from previous day: 04/12 0701 - 04/13 0700 In:  2513.6 [I.V.:1638.1; IV Piggyback:875.5] Out: 2600 [Urine:2600] Intake/Output this shift: No intake/output data recorded.  Lab Results: Recent Labs    01/14/20 0127 01/15/20 0444  WBC 6.6 5.0  HGB 9.8* 9.3*  HCT 32.8* 31.4*  PLT 302 281   BMET Recent Labs    01/14/20 2145 01/14/20 2145 01/15/20 0444 01/15/20 1911 01/16/20 0622  NA 143  --  143  --  142  K 2.9*   < > 2.9* 3.0* 3.5  CL 108  --  105  --  107  CO2 27  --  26  --  25  GLUCOSE 90  --  79  --  94  BUN 7*  --  7*  --  6*  CREATININE 0.70  --  0.70  --  0.74  CALCIUM 8.4*  --  8.4*  --  8.9   < > = values in this interval not displayed.   LFT Recent Labs    01/14/20 0127  PROT 8.1  ALBUMIN 3.8  AST 20  ALT 7  ALKPHOS 62  BILITOT 0.7   PT/INR No results for input(s): LABPROT, INR in the last 72 hours. Hepatitis Panel No results for input(s): HEPBSAG, HCVAB, HEPAIGM, HEPBIGM in the last 72 hours.  DG Abd 1 View  Result Date: 01/16/2020 CLINICAL DATA:  Ileus EXAM: ABDOMEN - 1 VIEW COMPARISON:  January 15, 2020. FINDINGS: There remains prominence of the sigmoid colon, although the sigmoid colon is slightly less distended than 1 day prior. There is moderate stool in the colon. No free air noted. Visualized lung bases clear. There are vascular  calcifications in the pelvis. IMPRESSION: Sigmoid distension, slightly less prominent than 1 day prior. Suspect colonic ileus as most likely etiology, although patient is at risk for sigmoid volvulus. Close clinical and imaging surveillance advised in this regard. No free air evident. Electronically Signed   By: Justin Griffin M.D.   On: 01/16/2020 07:58   DG Abd 1 View  Result Date: 01/15/2020 CLINICAL DATA:  Ileus EXAM: ABDOMEN - 1 VIEW COMPARISON:  Abdominal CT from yesterday FINDINGS: Generalized gaseous distension of colon. The sigmoid segment is maximally dilated and is 18 cm in diameter. There is no obstructive process or twist on preceding abdominal CT.  IMPRESSION: Colonic ileus pattern with progressive sigmoid distension, now up to 18 cm in diameter. Electronically Signed   By: Justin Griffin M.D.   On: 01/15/2020 06:21    Assessment / Plan:  38. 79 year old male with progressive Alzheimer's disease admitted to [facility] from [facility] on 4/11 with diarrhea and abdominal distension. 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 Labs in the ED showed hypokalemia ( K+ 2.8 - 2.9 ). KCL IV replacement administered. Today K+ 3.5. Magnesium 2.0. KUB this morning showed sigmoid distension, slightly less prominent than prior xray.  RN reported patient passed 2 large loose stools yesterday evening and last night after receiving Dulcolax suppository x 2. Pt is a DNR. -Repeat KUB in am -Consider rectal tube, flex sig for decompression if no further improvement. Daughter contacted, she will discuss further with other family members -Comfort measures recommended -Continue IV fluid -? PEG tube  2. History of a benign esophageal stenosis, last dilated at the time of EGD in 2019  3. Microcytic Anemia. Hg 9.3 -> 9.5. HCT 32.1. MCV 70.1.  Iron 109. Ferritin 14.  No signs of active GI bleeding.   4. History of CAD, s/p CABG 2006  Further recommendations per Dr. Rush Landmark       Active Problems:   Ileus ([place])     LOS: 1 day   Justin Griffin  01/16/2020, 9:19 AM

## 2020-01-17 ENCOUNTER — Inpatient Hospital Stay (HOSPITAL_COMMUNITY): Payer: Medicare (Managed Care)

## 2020-01-17 DIAGNOSIS — Z7189 Other specified counseling: Secondary | ICD-10-CM

## 2020-01-17 DIAGNOSIS — K567 Ileus, unspecified: Secondary | ICD-10-CM | POA: Diagnosis not present

## 2020-01-17 DIAGNOSIS — F0390 Unspecified dementia without behavioral disturbance: Secondary | ICD-10-CM

## 2020-01-17 DIAGNOSIS — R531 Weakness: Secondary | ICD-10-CM

## 2020-01-17 DIAGNOSIS — K5981 Ogilvie syndrome: Secondary | ICD-10-CM

## 2020-01-17 DIAGNOSIS — E876 Hypokalemia: Secondary | ICD-10-CM | POA: Diagnosis not present

## 2020-01-17 DIAGNOSIS — Z515 Encounter for palliative care: Secondary | ICD-10-CM

## 2020-01-17 DIAGNOSIS — R140 Abdominal distension (gaseous): Secondary | ICD-10-CM | POA: Diagnosis not present

## 2020-01-17 DIAGNOSIS — R1084 Generalized abdominal pain: Secondary | ICD-10-CM

## 2020-01-17 LAB — GLUCOSE, CAPILLARY
Glucose-Capillary: 100 mg/dL — ABNORMAL HIGH (ref 70–99)
Glucose-Capillary: 76 mg/dL (ref 70–99)
Glucose-Capillary: 92 mg/dL (ref 70–99)
Glucose-Capillary: 94 mg/dL (ref 70–99)
Glucose-Capillary: 94 mg/dL (ref 70–99)

## 2020-01-17 LAB — PHOSPHORUS: Phosphorus: 3 mg/dL (ref 2.5–4.6)

## 2020-01-17 LAB — BASIC METABOLIC PANEL
Anion gap: 7 (ref 5–15)
BUN: 5 mg/dL — ABNORMAL LOW (ref 8–23)
CO2: 25 mmol/L (ref 22–32)
Calcium: 8.7 mg/dL — ABNORMAL LOW (ref 8.9–10.3)
Chloride: 108 mmol/L (ref 98–111)
Creatinine, Ser: 0.68 mg/dL (ref 0.61–1.24)
GFR calc Af Amer: >60 mL/min (ref >60)
GFR calc non Af Amer: >60 mL/min (ref >60)
Glucose, Bld: 105 mg/dL — ABNORMAL HIGH (ref 70–99)
Potassium: 3.3 mmol/L — ABNORMAL LOW (ref 3.5–5.1)
Sodium: 140 mmol/L (ref 135–145)

## 2020-01-17 LAB — MAGNESIUM: Magnesium: 2 mg/dL (ref 1.7–2.4)

## 2020-01-17 MED ORDER — POTASSIUM CHLORIDE 10 MEQ/100ML IV SOLN
10.0000 meq | INTRAVENOUS | Status: AC
  Administered 2020-01-17 (×4): 10 meq via INTRAVENOUS
  Filled 2020-01-17: qty 100

## 2020-01-17 NOTE — TOC Initial Note (Addendum)
Transition of Care [facility]) - Initial/Assessment Note    Patient Details  Name: Justin Griffin MRN: [id number] Date of Birth: August 21, 1941  [facility]) CM/SW Contact:    Lia Hopping, LCSW Phone Number: 01/17/2020, 3:35 PM  Clinical Narrative:                 CSW received a phone call from [facility] social worker . She reports the patient is active with their community services and will assist with the patient discharge planning. CSW provided and update about patient unknown discharge plan at this time. She reports the patient is a long term care resident at [facility]. Patient needs assistance with all ADL's.  She gave CSW hospice/palliative service stipulations at SNF vs. [facility] if patient family agreed.  Patient family wants all care for the patient at this time.   CSW reached out to the patient daughter and introduced role. Daughter is hopeful the patient will return to [facility].   TOC staff  will continue to follow.    Expected Discharge Plan: Skilled Nursing Facility Barriers to Discharge: Continued Medical Work up   Patient Goals and CMS Choice Patient states their goals for this hospitalization and ongoing recovery are:: Ongoing Care   Choice offered to / list presented to : NA  Expected Discharge Plan and Services Expected Discharge Plan: [place] In-house Referral: Hospice / [facility] Acute Care Choice: [place] Living arrangements for the past 2 months: [place]                                      Prior Living Arrangements/Services Living arrangements for the past 2 months: [place] Lives with:: Facility Resident Patient language and need for interpreter reviewed:: No Do you feel safe going back to the place where you live?: Yes      Need for Family Participation in Patient Care: Yes (Comment) Care giver support system in place?: Yes  (comment) Current home services: Other (comment)([facility]) Criminal Activity/Legal Involvement Pertinent to Current Situation/Hospitalization: No - Comment as needed  Activities of Daily Living      Permission Sought/Granted Permission sought to share information with : Family Supports, Case Manager Permission granted to share information with : Yes, Verbal Permission Granted              Emotional Assessment Appearance:: Appears stated age       Alcohol / Substance Use: Not Applicable Psych Involvement: No (comment)  Admission diagnosis:  Hypokalemia [E87.6] Abdominal distension [R14.0] Ileus ([place]) [K56.7] Patient Active Problem List   Diagnosis Date Noted  . 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 Ileus ([place]) 01/14/2020  . Adynamic ileus ([place]) 03/21/2019  . Dementia without behavioral disturbance ([place])   . Goals of care, counseling/discussion   . Palliative care by specialist   . Pressure injury of skin 03/20/2019  . Abdominal distention   . Sepsis ([place]) 03/19/2019  . Acute hypernatremia 03/19/2019  . Hypokalemia 03/19/2019  . Anemia 03/19/2019  . Dehydration   . Delirium 06/11/2017  . Acute kidney injury ([place]) 06/11/2017  . Fever 06/11/2017  . Effusion of right knee 06/11/2017  . Inguinal hernia 12/24/2016  . Testicular pain, left 08/07/2016  . Hemorrhoid 08/07/2016  . 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[place]) 07/24/2016  . Routine general medical examination  at a health care facility 01/27/2016  . Medicare annual wellness visit, subsequent 01/27/2016  . Essential hypertension, benign 08/08/2009  . GERD 07/04/2008  . RHEUMATOID ARTHRITIS 07/04/2008  . WEIGHT LOSS-ABNORMAL 07/04/2008  . DYSPHAGIA 07/04/2008  . TOBACCO USER 07/03/2008  . POLYP, COLON 02/04/2008  . DYSLIPIDEMIA 02/04/2008  . Essential hypertension 02/04/2008  . Coronary artery disease 02/04/2008  . POLYPECTOMY, HX OF 02/04/2008  . CORONARY ARTERY BYPASS GRAFT, HX OF  02/04/2008   PCP:  [facility], [place]:   [place], [place] - [address] [place] [address] Phone: [phone] Fax: [phone]     Social Determinants of Health (SDOH) Interventions    Readmission Risk Interventions Readmission Risk Prevention Plan 03/20/2019  Transportation Screening Complete  PCP or Specialist Appt within 3-5 Days Complete  HRI or [place] Complete  Social Work Consult for [place] Planning/Counseling Complete  Palliative Care Screening Not Applicable  Medication Review Press photographer) Complete  Some recent data might be hidden

## 2020-01-17 NOTE — Progress Notes (Signed)
[facility] Progress Note  CC:  Colonic ileus   Subjective: Patient is nonverbal. RN reported patient passed a large brown mud like loose stool last night.    Objective:  Vital signs in last 24 hours: Temp:  [97.4 F (36.3 C)-97.6 F (36.4 C)] 97.6 F (36.4 C) (04/14 0606) Pulse Rate:  [61-80] 71 (04/14 0606) Resp:  [16-18] 18 (04/14 0606) BP: (126-166)/(57-118) 138/57 (04/14 0606) SpO2:  [93 %-100 %] 100 % (04/14 0606) Weight:  [73.4 kg] 73.4 kg (04/14 0606) Last BM Date: 01/16/20   General:  Patient remains unresponsive. Eyes closed.  Heart: RRR, no murmur. Pulm:  Diminshed breath sounds throughout.  Abdomen: Distended R > L  but overall less distended than yesterday's exam, hypoactive BS x 4 quads.  Extremities:  Without edema. Neurologic:  Does not follow any commands. Spontaneous movement of upper extremities.    Intake/Output from previous day: 04/13 0701 - 04/14 0700 In: 2565.8 [I.V.:2058.8; IV Piggyback:507] Out: 1700 [Urine:1700] Intake/Output this shift: No intake/output data recorded.  Lab Results: Recent Labs    01/15/20 0444 01/16/20 0622  WBC 5.0 6.5  HGB 9.3* 9.5*  HCT 31.4* 32.1*  PLT 281 273   BMET Recent Labs    01/15/20 0444 01/15/20 0444 01/15/20 1911 01/16/20 0622 01/17/20 0446  NA 143  --   --  142 140  K 2.9*   < > 3.0* 3.5 3.3*  CL 105  --   --  107 108  CO2 26  --   --  25 25  GLUCOSE 79  --   --  94 105*  BUN 7*  --   --  6* 5*  CREATININE 0.70  --   --  0.74 0.68  CALCIUM 8.4*  --   --  8.9 8.7*   < > = values in this interval not displayed.   LFT No results for input(s): PROT, ALBUMIN, AST, ALT, ALKPHOS, BILITOT, BILIDIR, IBILI in the last 72 hours. PT/INR No results for input(s): LABPROT, INR in the last 72 hours. Hepatitis Panel No results for input(s): HEPBSAG, HCVAB, HEPAIGM, HEPBIGM in the last 72 hours.  DG Abd 1 View  Result Date: 01/16/2020 CLINICAL DATA:  Ileus EXAM: ABDOMEN - 1 VIEW  COMPARISON:  January 15, 2020. FINDINGS: There remains prominence of the sigmoid colon, although the sigmoid colon is slightly less distended than 1 day prior. There is moderate stool in the colon. No free air noted. Visualized lung bases clear. There are vascular calcifications in the pelvis. IMPRESSION: Sigmoid distension, slightly less prominent than 1 day prior. Suspect colonic ileus as most likely etiology, although patient is at risk for sigmoid volvulus. Close clinical and imaging surveillance advised in this regard. No free air evident. Electronically Signed   By: Lowella Grip III M.D.   On: 01/16/2020 07:58    Assessment / Plan: 4. 79 year old male with progressive Alzheimer's disease admitted to [facility] from [facility] on 4/11 with diarrhea and abdominal distension. 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 Labs in the ED showed hypokalemia ( K+ 2.8 - 2.9 ). KCL IV replacement administered. Today K+ 3.3. Mg++ 2.0. He received Dulcolax suppository x 2 which resulted in passed loose brown stool. Last loose brown BM was last night as reported by the nursing staff.  Magnesium 2.0. KUB this am pending.  -Await KUB results -Goal K+ 4.0 and Mag++ 2.0.  -Consider rectal tube, flex sig for decompression if no further improvement -Comfort measures recommended -Continue IV fluid -? Feeding tube  2. History of a benign esophageal stenosis, last dilated at the time of EGD in 2019  3. Microcytic Anemia. Hg 9.3 -> 9.5. HCT 32.1. MCV 70.1.  Iron 109. Ferritin 14. No signs of active GI bleeding.   4. History of CAD, s/p CABG 2006  Further recommendations per Dr. Rush Landmark     Active Problems:   Ileus ([place])     LOS: 2 days   Justin Griffin  01/17/2020, 9:09 AM

## 2020-01-17 NOTE — Progress Notes (Signed)
PROGRESS NOTE    Justin Griffin  [id number] DOB: 1941-03-12 DOA: 01/14/2020 PCP: [facility] [place]   Brief Narrative: Justin Griffin is a 79 y.o. male with history of Alzheimer's disease/advanced dementia, hypertension, hyperlipidemia, coronary disease, GERD, arthritis. Patient presented   Assessment & Plan:   Principal Problem:   Ileus ([place]) Active Problems:   Essential hypertension   Dementia without behavioral disturbance (HCC)   Ogilvie syndrome   Colonic Ileus Some improvement with liquid stools but still with significant distension.  -GI recommendations: no endoscopic decompression at this time. -Palliative care recommendations: palliative care consult on discharge, continue current scope of care  Hypokalemia Hypophosphatemia -Replete as needed -Daily BMP  Advanced dementia Nonverbal at baseline. Does not follow commands. Stable.  Microcytic anemia Baseline hemoglobin of 9. Stable.  Essential hypertension Not very well controlled. Not on treatment as an outpatient. -Continue to allow some hypertension; will treat if consistently with SBP >180 and DBP >100 mmHg  History of CAD History of CABG. On aspirin as an outpatient which has been held.  GERD -Continue Protonix  Poor nutritional intake Concerning in setting of dementia. Hopefully patient will have improved nutritional intake. If not, will need further goals of care discussions.   DVT prophylaxis: Heparin subq Code Status:   Code Status: DNR Family Communication: None at bedside Disposition Plan: Discharge pending continued GI recommendations. Anticipate discharge back to SNF.   Consultants:   Gastroenterology  Procedures:   None  Antimicrobials:  None    Subjective: Patient is non-verbal.  Objective: Vitals:   01/16/20 0600 01/16/20 1411 01/16/20 2215 01/17/20 0606  BP:  (!) 166/118 (!) 126/99 (!) 138/57  Pulse:  61 80 71  Resp:  18 16 18   Temp:   (!) 97.4 F (36.3 C)  97.6 F (36.4 C)  TempSrc:  Oral  Oral  SpO2: 97% 100% 93% 100%  Weight:    73.4 kg    Intake/Output Summary (Last 24 hours) at 01/17/2020 0931 Last data filed at 01/17/2020 [id number] Gross per 24 hour  Intake 2565.83 ml  Output 1700 ml  Net 865.83 ml   Filed Weights   01/15/20 0542 01/17/20 0606  Weight: 66.4 kg 73.4 kg    Examination:  General exam: Appears calm and comfortable Respiratory system: Clear to auscultation. Respiratory effort normal. Cardiovascular system: S1 & S2 heard, RRR. No murmurs, rubs, gallops or clicks. Gastrointestinal system: Abdomen is significant distended, soft and nontender. No organomegaly or masses felt. Normal bowel sounds heard. Central nervous system: Does not open eyes to request. Groans and moves my hands away Extremities: No edema. No calf tenderness Skin: No cyanosis. No rashes Psychiatry: Judgement and insight appear impaired.    Data Reviewed: I have personally reviewed following labs and imaging studies  CBC: Recent Labs  Lab 01/14/20 0127 01/15/20 0444 01/16/20 0622  WBC 6.6 5.0 6.5  NEUTROABS  --   --  3.6  HGB 9.8* 9.3* 9.5*  HCT 32.8* 31.4* 32.1*  MCV 69.5* 69.8* 70.1*  PLT 302 281 [id number]   Basic Metabolic Panel: Recent Labs  Lab 01/14/20 0127 01/14/20 0127 01/14/20 1411 01/14/20 1411 01/14/20 2145 01/15/20 0444 01/15/20 1911 01/16/20 0622 01/17/20 0436 01/17/20 0446  NA 144   < > 143  --  143 143  --  142  --  140  K 2.8*   < > 3.0*   < > 2.9* 2.9* 3.0* 3.5  --  3.3*  CL 107   < >  107  --  108 105  --  107  --  108  CO2 28   < > 29  --  27 26  --  25  --  25  GLUCOSE 96   < > 88  --  90 79  --  94  --  105*  BUN 11   < > 7*  --  7* 7*  --  6*  --  5*  CREATININE 0.98   < > 0.78  --  0.70 0.70  --  0.74  --  0.68  CALCIUM 9.0   < > 8.4*  --  8.4* 8.4*  --  8.9  --  8.7*  MG 2.4  --  2.1  --   --  2.2  --  2.0 2.0  --   PHOS  --   --   --   --   --   --   --  2.0*  --  3.0   < > = values in  this interval not displayed.   GFR: CrCl cannot be calculated (Unknown ideal weight.). Liver Function Tests: Recent Labs  Lab 01/14/20 0127  AST 20  ALT 7  ALKPHOS 62  BILITOT 0.7  PROT 8.1  ALBUMIN 3.8   Recent Labs  Lab 01/14/20 0127  LIPASE 24   No results for input(s): AMMONIA in the last 168 hours. Coagulation Profile: No results for input(s): INR, PROTIME in the last 168 hours. Cardiac Enzymes: No results for input(s): CKTOTAL, CKMB, CKMBINDEX, TROPONINI in the last 168 hours. BNP (last 3 results) No results for input(s): PROBNP in the last 8760 hours. HbA1C: No results for input(s): HGBA1C in the last 72 hours. CBG: Recent Labs  Lab 01/15/20 1208 01/15/20 1706 01/17/20 0005 01/17/20 0601 01/17/20 0736  GLUCAP 88 82 94 76 100*   Lipid Profile: No results for input(s): CHOL, HDL, LDLCALC, TRIG, CHOLHDL, LDLDIRECT in the last 72 hours. Thyroid Function Tests: No results for input(s): TSH, T4TOTAL, FREET4, T3FREE, THYROIDAB in the last 72 hours. Anemia Panel: Recent Labs    01/15/20 0444  FERRITIN 14*  TIBC 358  IRON 109   Sepsis Labs: No results for input(s): PROCALCITON, LATICACIDVEN in the last 168 hours.  No results found for this or any previous visit (from the past 240 hour(s)).       Radiology Studies: DG Abd 1 View  Result Date: 01/16/2020 CLINICAL DATA:  Ileus EXAM: ABDOMEN - 1 VIEW COMPARISON:  January 15, 2020. FINDINGS: There remains prominence of the sigmoid colon, although the sigmoid colon is slightly less distended than 1 day prior. There is moderate stool in the colon. No free air noted. Visualized lung bases clear. There are vascular calcifications in the pelvis. IMPRESSION: Sigmoid distension, slightly less prominent than 1 day prior. Suspect colonic ileus as most likely etiology, although patient is at risk for sigmoid volvulus. Close clinical and imaging surveillance advised in this regard. No free air evident. Electronically Signed    By: Lowella Grip III M.D.   On: 01/16/2020 07:58        Scheduled Meds: . brimonidine  1 drop Left Eye BID   And  . timolol  1 drop Left Eye BID  . brinzolamide  1 drop Left Eye TID  . heparin  5,000 Units Subcutaneous Q12H  . latanoprost  1 drop Both Eyes QHS  . sodium chloride flush  3 mL Intravenous Q12H   Continuous Infusions: . dextrose 5 % and  0.9% NaCl 75 mL/hr at 01/17/20 0917  . potassium chloride 10 mEq (01/17/20 0917)     LOS: 2 days     Cordelia Poche, MD [facility] 01/17/2020, 9:31 AM  If 7PM-7AM, please contact night-coverage [email or web address]

## 2020-01-17 NOTE — Consult Note (Signed)
Consultation Note Date: 01/17/2020   Patient Name: Justin Griffin  DOB: 1941/09/20  MRN: [id number]  Age / Sex: 79 y.o., male  PCP: [facility] [place] Referring Physician: Mariel Aloe, MD  Reason for Consultation: Establishing goals of care  HPI/Patient Profile: 79 y.o. male  admitted on 01/14/2020    Clinical Assessment and Goals of Care: 79 year old gentleman from [facility], history of progressive Alzheimer's disease, has been in nursing facility for some time now.  Patient came in with abdominal distention.  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  Additionally also with electrolyte abnormalities namely hypokalemia.  Patient being followed by GI specialist.  On bowel regimen as well as electrolyte replacements.  Also has remote history of coronary artery disease status post CABG.  Palliative consultation for ongoing supportive care, goals of care discussions. Justin Griffin does not verbalize much.  He is resting in bed with eyes closed.  He does not appear to have nonverbal gestures of distress or discomfort.  Abdomen is mildly distended.  Chart reviewed in detail.  Call placed and discussed with daughter Justin Griffin.  I gave her an update about the patient's current condition.  Goals wishes and values attempted to be explored.  Recommend palliative services following the patient upon discharge.  Patient's family continues to endorse continuation of current mode of care, all appropriate medications to relieve symptoms, wish to avoid surgeries/procedures.  Answered all of their questions to the best of my ability.  See below.  Thank you for the consult.  NEXT OF KIN Daughter Justin Griffin.  He has 3 kids.  SUMMARY OF RECOMMENDATIONS   Agree  with DNR, Continue current mode of care Recommend palliative services following at [facility] on discharge.  Code Status/Advance Care Planning:  DNR    Symptom Management:   As above  Palliative Prophylaxis:   Delirium Protocol  Psycho-social/Spiritual:   Desire for further Chaplaincy support:yes  Additional Recommendations: Caregiving  Support/Resources  Prognosis:   Unable to determine  Discharge Planning: Recommend palliative services following when the patient is discharged back to [facility]      Primary Diagnoses: Present on Admission: . Ileus ([place])   I have reviewed the medical record, interviewed the patient and family, and examined the patient. The following aspects are pertinent.  Past Medical History:  Diagnosis Date  . Alzheimer's disease ([place])   . CAD (coronary artery disease)    Catheterization 2006 LAD occluded, OM 95% stenosis followed by mid occlusion, first obtuse marginal occluded, right coronary artery diffuse moderate disease. LIMA to the LAD was patent. Saphenous vein to PDA and posterior lateral patent with diffuse luminal irregularities, saphenous vein graft to second obtuse marginal is patent, saphenous vein graft to the first obtuse marginal had 25% stenosis.  . Esophageal reflux   . Esophageal stricture   . Other and unspecified hyperlipidemia   . Other dysphagia   .  Rheumatoid arthritis(714.0)   . Status post dilation of esophageal narrowing   . Unspecified essential hypertension    Social History   Socioeconomic History  . Marital status: Widowed    Spouse name: Not on file  . Number of children: 4  . Years of education: 10  . Highest education level: Not on file  Occupational History  . Occupation: RETIRED  Tobacco Use  . Smoking status: Former Smoker    Years: 0.00    Types: Cigarettes    Quit date: 10/05/1994    Years since quitting: 25.3  . Smokeless tobacco: Never Used  Substance and Sexual Activity  . Alcohol use:  No  . Drug use: No  . Sexual activity: Not on file  Other Topics Concern  . Not on file  Social History Narrative   Denies abuse and feel safe at home.   Fun: Watch TV.    Social Determinants of Health   Financial Resource Strain:   . Difficulty of Paying Living Expenses:   Food Insecurity:   . Worried About Charity fundraiser in the Last Year:   . Arboriculturist in the Last Year:   Transportation Needs:   . Film/video editor (Medical):   Marland Kitchen Lack of Transportation (Non-Medical):   Physical Activity:   . Days of Exercise per Week:   . Minutes of Exercise per Session:   Stress:   . Feeling of Stress :   Social Connections:   . Frequency of Communication with Friends and Family:   . Frequency of Social Gatherings with Friends and Family:   . Attends Religious Services:   . Active Member of Clubs or Organizations:   . Attends Archivist Meetings:   Marland Kitchen Marital Status:    Family History  Problem Relation Age of Onset  . Heart disease Maternal Grandfather   . Prostate cancer Paternal Grandfather   . Heart disease Mother   . Colon cancer Neg Hx   . Esophageal cancer Neg Hx   . Stomach cancer Neg Hx   . Pancreatic cancer Neg Hx   . Liver disease Neg Hx    Scheduled Meds: . brimonidine  1 drop Left Eye BID   And  . timolol  1 drop Left Eye BID  . brinzolamide  1 drop Left Eye TID  . heparin  5,000 Units Subcutaneous Q12H  . latanoprost  1 drop Both Eyes QHS  . sodium chloride flush  3 mL Intravenous Q12H   Continuous Infusions: . dextrose 5 % and 0.9% NaCl 75 mL/hr at 01/17/20 0917   PRN Meds:.acetaminophen **OR** acetaminophen, labetalol Medications Prior to Admission:  Prior to Admission medications   Medication Sig Start Date End Date Taking? Authorizing Provider  aspirin EC 81 MG tablet Take 81 mg by mouth daily.   Yes [provider]  brimonidine-timolol (COMBIGAN) 0.2-0.5 % ophthalmic solution Place 1 drop into the left eye every 12  (twelve) hours.   Yes [provider]  brinzolamide (AZOPT) 1 % ophthalmic suspension Place 1 drop into the left eye 3 (three) times daily.   Yes [provider]  latanoprost (XALATAN) 0.005 % ophthalmic solution Place 1 drop into both eyes at bedtime.   Yes [provider]  NON FORMULARY Take 1 Can by mouth See admin instructions. Mighty Shake: Drink 1 can by mouth three times a day- 8:30 AM/11:30 AM/6 PM   Yes [provider]  pantoprazole (PROTONIX) 40 MG tablet Take 40  mg by mouth daily before breakfast.   Yes [provider]   Allergies  Allergen Reactions  . Ace Inhibitors Other (See Comments)    "Allergic," per paperwork from [facility]  . Lisinopril Other (See Comments)    "Allergic," per paperwork from [facility]  . Zocor [Simvastatin] Other (See Comments)    Muscle pain   Review of Systems Does not verbalize Physical Exam Resting in bed, does not respond, eyes closed Diminished breath sounds Abdomen with generalized distention Does not follow commands Does not have edema  Vital Signs: BP (!) 149/87 (BP Location: Left Arm)   Pulse 61   Temp (!) 97.4 F (36.3 C) (Axillary)   Resp 14   Wt 73.4 kg   SpO2 94%   BMI 21.95 kg/m  Pain Scale: Faces   Pain Score: Asleep   SpO2: SpO2: 94 % O2 Device:SpO2: 94 % O2 Flow Rate: .O2 Flow Rate (L/min): 2 L/min  IO: Intake/output summary:   Intake/Output Summary (Last 24 hours) at 01/17/2020 1508 Last data filed at 01/17/2020 1330 Gross per 24 hour  Intake 3071.53 ml  Output 1600 ml  Net 1471.53 ml    LBM: Last BM Date: 01/16/20 Baseline Weight: Weight: 66.4 kg Most recent weight: Weight: 73.4 kg     Palliative Assessment/Data:   PPS 40%  Time In:  1400 Time Out:  1500 Time Total:  60  Greater than 50%  of this time was spent counseling and coordinating care related to the above assessment and plan.  Signed by: Loistine Chance, MD   Please contact Palliative Medicine  Team phone at [phone] for questions and concerns.  For individual provider: See Shea Evans

## 2020-01-18 DIAGNOSIS — R140 Abdominal distension (gaseous): Secondary | ICD-10-CM | POA: Diagnosis not present

## 2020-01-18 DIAGNOSIS — K5981 Ogilvie syndrome: Secondary | ICD-10-CM | POA: Diagnosis not present

## 2020-01-18 LAB — GLUCOSE, CAPILLARY
Glucose-Capillary: 104 mg/dL — ABNORMAL HIGH (ref 70–99)
Glucose-Capillary: 80 mg/dL (ref 70–99)
Glucose-Capillary: 85 mg/dL (ref 70–99)
Glucose-Capillary: 88 mg/dL (ref 70–99)

## 2020-01-18 LAB — BASIC METABOLIC PANEL
Anion gap: 9 (ref 5–15)
BUN: <5 mg/dL — ABNORMAL LOW (ref 8–23)
CO2: 26 mmol/L (ref 22–32)
Calcium: 8.9 mg/dL (ref 8.9–10.3)
Chloride: 105 mmol/L (ref 98–111)
Creatinine, Ser: 0.83 mg/dL (ref 0.61–1.24)
GFR calc Af Amer: >60 mL/min (ref >60)
GFR calc non Af Amer: >60 mL/min (ref >60)
Glucose, Bld: 105 mg/dL — ABNORMAL HIGH (ref 70–99)
Potassium: 3.1 mmol/L — ABNORMAL LOW (ref 3.5–5.1)
Sodium: 140 mmol/L (ref 135–145)

## 2020-01-18 LAB — POTASSIUM: Potassium: 3.2 mmol/L — ABNORMAL LOW (ref 3.5–5.1)

## 2020-01-18 LAB — MAGNESIUM
Magnesium: 2 mg/dL (ref 1.7–2.4)
Magnesium: 1.9 mg/dL (ref 1.7–2.4)

## 2020-01-18 LAB — PHOSPHORUS: Phosphorus: 2.8 mg/dL (ref 2.5–4.6)

## 2020-01-18 MED ORDER — POTASSIUM CHLORIDE 10 MEQ/100ML IV SOLN
10.0000 meq | INTRAVENOUS | Status: AC
  Administered 2020-01-18 (×4): 10 meq via INTRAVENOUS
  Filled 2020-01-18 (×4): qty 100

## 2020-01-18 MED ORDER — POTASSIUM CHLORIDE 10 MEQ/100ML IV SOLN
10.0000 meq | INTRAVENOUS | Status: AC
  Administered 2020-01-18 (×5): 10 meq via INTRAVENOUS
  Filled 2020-01-18 (×4): qty 100

## 2020-01-18 MED ORDER — BISACODYL 10 MG RE SUPP
10.0000 mg | Freq: Every day | RECTAL | Status: DC
  Administered 2020-01-18 – 2020-01-20 (×3): 10 mg via RECTAL
  Filled 2020-01-18 (×3): qty 1

## 2020-01-18 NOTE — Care Management Important Message (Signed)
Important Message  Patient Details IM Letter given to [facility] Case Manager to present to the Patient Name: Justin Griffin MRN: [id number] Date of Birth: 1941-03-22   Medicare Important Message Given:  Yes     Kerin Salen 01/18/2020, 12:01 PM

## 2020-01-18 NOTE — Progress Notes (Addendum)
     Progress Note    ASSESSMENT AND PLAN:   79 yo male with Alzheimer's disease, HTN, HL, CAD, and GERD  # Colonic pseudoobstruction / large volume stool in colon --Yesterday's abdominal films basically unchanged.  --Hypokalemia.  Mg+ okay at 2.  He got  Another 4 runs of KCL yesterday and K+ lower today at 3.1. I reached out to [facility]. Dr. Teryl Lucy giving more K+ runs, rechecking Mg+ level. If K+ not up then might put K+ in maintenance IV fluids  --Placed nursing order to log roll Q  2 hours --Dulcolax supp now and Q am  --We spoke with family two days ago and they were not sure about proceeding with decompressive sigmoidoscopy even if we felt it to be necessary.  --Nutritional needs?  First time seeing him today but apparently he is not taking much in way of PO. Was going to place NGT for enteral feedings but learned that patient will apparently eat if someone feeds him. In this case will ask SLP to clear swallowing and if passes then try PO.   # Iron deficiency anemia --Hgb stable at 9.5, ferritin 14  SUBJECTIVE   Sleeping. Didn't wake up during my exam.    OBJECTIVE:     Vital signs in last 24 hours: Temp:  [97.4 F (36.3 C)-98.1 F (36.7 C)] 98.1 F (36.7 C) (04/14 2151) Pulse Rate:  [61-68] 68 (04/14 2151) Resp:  [14-16] 16 (04/14 2151) BP: (149-169)/(85-87) 169/85 (04/14 2151) SpO2:  [94 %-100 %] 100 % (04/14 2151) Last BM Date: 01/16/20 General:   Thin male in NAD Heart:  Regular rate and rhythm;  No lower extremity edema   Pulm: Normal respiratory effort   Abdomen:  Soft, mild-mod distended, non-tender.  A few bowel sounds            Intake/Output from previous day: 04/14 0701 - 04/15 0700 In: 2157.1 [I.V.:1757.1; IV Piggyback:400] Out: 3250 [Urine:3250] Intake/Output this shift: No intake/output data recorded.  Lab Results: Recent Labs    01/16/20 0622  WBC 6.5  HGB 9.5*  HCT 32.1*  PLT 273   BMET Recent Labs    01/16/20 0622 01/17/20 0446  01/18/20 0459  NA 142 140 140  K 3.5 3.3* 3.1*  CL 107 108 105  CO2 25 25 26   GLUCOSE 94 105* 105*  BUN 6* 5* <5*  CREATININE 0.74 0.68 0.83  CALCIUM 8.9 8.7* 8.9    DG Abd 2 Views  Result Date: 01/17/2020 CLINICAL DATA:  79 year old male with diarrhea after Dulcolax suppository. Hypoactive bowel sounds. EXAM: ABDOMEN - 2 VIEW COMPARISON:  CT Abdomen and Pelvis 01/14/2020. Abdominal radiographs 01/16/2020 and earlier. FINDINGS: Upright and supine views at 1627 hours. Negative lung bases. No pneumoperitoneum identified. Stable bowel gas pattern from the recent CT with gas-filled, dilated large bowel, but no dilated small bowel loops. Stable visualized osseous structures.  Prior CABG. IMPRESSION: No free air. Unchanged bowel gas pattern from the CT 01/14/2020, favor decreased colonic inertia/institutional bowel. Electronically Signed   By: Genevie Ann M.D.   On: 01/17/2020 18:49    Principal Problem:   Ileus () Active Problems:   Essential hypertension   Dementia without behavioral disturbance (HCC)   Ogilvie syndrome     LOS: 3 days   Tye Savoy ,NP 01/18/2020, 8:41 AM

## 2020-01-18 NOTE — Progress Notes (Signed)
Spoke with Dr. Teryl Lucy regarding c-diff order versus illeus resolving. C-diff panel ordered on admission.  Nursing staff unable to collect enough stool for sample.  Dr. Teryl Lucy to d/c order and precautions.  Will continue to follow.

## 2020-01-18 NOTE — Progress Notes (Signed)
PROGRESS NOTE    Justin Griffin  [id number] DOB: July 18, 1941 DOA: 01/14/2020 PCP: [facility] [place]   Brief Narrative: Justin Griffin is a 79 y.o. male with history of Alzheimer's disease/advanced dementia, hypertension, hyperlipidemia, coronary disease, GERD, arthritis. Patient presented   Assessment & Plan:   Principal Problem:   Ileus ([place]) Active Problems:   Essential hypertension   Dementia without behavioral disturbance (HCC)   Ogilvie syndrome   Colonic Ileus Some improvement with liquid stools but still with significant distension. MOST form reviewed; no feeding tube for permament or temporary reasons. Will need to focus on by mouth nutrition. Per nursing, patient able to eat if he is fed. SLP evaluated and is recommending a dysphagia 1 diet. -GI recommendations: no endoscopic decompression at this time. Advance diet -Palliative care recommendations: palliative care consult on discharge, continue current scope of care  Hypokalemia Hypophosphatemia -Replete as needed -Daily BMP  Advanced dementia Nonverbal at baseline. Does not follow commands. Stable.  Microcytic anemia Baseline hemoglobin of 9. Stable.  Essential hypertension Not very well controlled. Not on treatment as an outpatient. -Continue to allow some hypertension; will treat if consistently with SBP >180 and DBP >100 mmHg  History of CAD History of CABG. On aspirin as an outpatient which has been held.  GERD -Continue Protonix  Poor nutritional intake Concerning in setting of dementia. Hopefully patient will have improved nutritional intake. If not, will need further goals of care discussions. -Dysphagia 1 diet   DVT prophylaxis: Heparin subq Code Status:   Code Status: DNR Family Communication: Called daughter on listed telephone numbers, no answer Disposition Plan: Discharge pending continued GI recommendations. Anticipate discharge back to SNF.   Consultants:     Gastroenterology  Palliative care medicine  Procedures:   None  Antimicrobials:  None    Subjective: Patient is non-verbal.  Objective: Vitals:   01/16/20 2215 01/17/20 0606 01/17/20 1309 01/17/20 2151  BP: (!) 126/99 (!) 138/57 (!) 149/87 (!) 169/85  Pulse: 80 71 61 68  Resp: 16 18 14 16   Temp:  97.6 F (36.4 C) (!) 97.4 F (36.3 C) 98.1 F (36.7 C)  TempSrc:  Oral Axillary Oral  SpO2: 93% 100% 94% 100%  Weight:  73.4 kg      Intake/Output Summary (Last 24 hours) at 01/18/2020 1253 Last data filed at 01/18/2020 0929 Gross per 24 hour  Intake 1651.4 ml  Output 3400 ml  Net -1748.6 ml   Filed Weights   01/15/20 0542 01/17/20 0606  Weight: 66.4 kg 73.4 kg    Examination:  General exam: Appears calm and comfortable Respiratory system: Clear to auscultation. Respiratory effort normal. Cardiovascular system: S1 & S2 heard, RRR. No murmurs, rubs, gallops or clicks. Gastrointestinal system: Abdomen is distended, soft and nontender. No bowel sounds heard. Central nervous system: Eyes closed but active. Does not acknowledge my presence. Does not follow commands. Appears to have some tongue flicking Extremities: No edema. No calf tenderness Skin: No cyanosis. No rashes Psychiatry: Judgement and insight appear impaired.    Data Reviewed: I have personally reviewed following labs and imaging studies  CBC: Recent Labs  Lab 01/14/20 0127 01/15/20 0444 01/16/20 0622  WBC 6.6 5.0 6.5  NEUTROABS  --   --  3.6  HGB 9.8* 9.3* 9.5*  HCT 32.8* 31.4* 32.1*  MCV 69.5* 69.8* 70.1*  PLT 302 281 [id number]   Basic Metabolic Panel: Recent Labs  Lab 01/14/20 1411 01/14/20 1411 01/14/20 2145 01/14/20 2145 01/15/20  [id number] 01/15/20 1911 01/16/20 0622 01/17/20 0436 01/17/20 0446 01/18/20 0459  NA 143   < > 143  --  143  --  142  --  140 140  K 3.0*   < > 2.9*   < > 2.9* 3.0* 3.5  --  3.3* 3.1*  CL 107   < > 108  --  105  --  107  --  108 105  CO2 29   < > 27  --  26   --  25  --  25 26  GLUCOSE 88   < > 90  --  79  --  94  --  105* 105*  BUN 7*   < > 7*  --  7*  --  6*  --  5* <5*  CREATININE 0.78   < > 0.70  --  0.70  --  0.74  --  0.68 0.83  CALCIUM 8.4*   < > 8.4*  --  8.4*  --  8.9  --  8.7* 8.9  MG 2.1  --   --   --  2.2  --  2.0 2.0  --  2.0  PHOS  --   --   --   --   --   --  2.0*  --  3.0 2.8   < > = values in this interval not displayed.   GFR: CrCl cannot be calculated (Unknown ideal weight.). Liver Function Tests: Recent Labs  Lab 01/14/20 0127  AST 20  ALT 7  ALKPHOS 62  BILITOT 0.7  PROT 8.1  ALBUMIN 3.8   Recent Labs  Lab 01/14/20 0127  LIPASE 24   No results for input(s): AMMONIA in the last 168 hours. Coagulation Profile: No results for input(s): INR, PROTIME in the last 168 hours. Cardiac Enzymes: No results for input(s): CKTOTAL, CKMB, CKMBINDEX, TROPONINI in the last 168 hours. BNP (last 3 results) No results for input(s): PROBNP in the last 8760 hours. HbA1C: No results for input(s): HGBA1C in the last 72 hours. CBG: Recent Labs  Lab 01/17/20 1130 01/17/20 1727 01/18/20 0007 01/18/20 0620 01/18/20 1227  GLUCAP 92 94 88 80 104*   Lipid Profile: No results for input(s): CHOL, HDL, LDLCALC, TRIG, CHOLHDL, LDLDIRECT in the last 72 hours. Thyroid Function Tests: No results for input(s): TSH, T4TOTAL, FREET4, T3FREE, THYROIDAB in the last 72 hours. Anemia Panel: No results for input(s): VITAMINB12, FOLATE, FERRITIN, TIBC, IRON, RETICCTPCT in the last 72 hours. Sepsis Labs: No results for input(s): PROCALCITON, LATICACIDVEN in the last 168 hours.  No results found for this or any previous visit (from the past 240 hour(s)).       Radiology Studies: DG Abd 2 Views  Result Date: 01/17/2020 CLINICAL DATA:  79 year old male with diarrhea after Dulcolax suppository. Hypoactive bowel sounds. EXAM: ABDOMEN - 2 VIEW COMPARISON:  CT Abdomen and Pelvis 01/14/2020. Abdominal radiographs 01/16/2020 and earlier.  FINDINGS: Upright and supine views at 1627 hours. Negative lung bases. No pneumoperitoneum identified. Stable bowel gas pattern from the recent CT with gas-filled, dilated large bowel, but no dilated small bowel loops. Stable visualized osseous structures.  Prior CABG. IMPRESSION: No free air. Unchanged bowel gas pattern from the CT 01/14/2020, favor decreased colonic inertia/institutional bowel. Electronically Signed   By: Genevie Ann M.D.   On: 01/17/2020 18:49        Scheduled Meds: . bisacodyl  10 mg Rectal Daily  . brimonidine  1 drop Left Eye BID   And  .  timolol  1 drop Left Eye BID  . brinzolamide  1 drop Left Eye TID  . heparin  5,000 Units Subcutaneous Q12H  . latanoprost  1 drop Both Eyes QHS  . sodium chloride flush  3 mL Intravenous Q12H   Continuous Infusions: . dextrose 5 % and 0.9% NaCl 75 mL/hr at 01/17/20 2354  . potassium chloride 10 mEq (01/18/20 1213)     LOS: 3 days     Cordelia Poche, MD [facility] 01/18/2020, 12:53 PM  If 7PM-7AM, please contact night-coverage [email or web address]

## 2020-01-18 NOTE — Evaluation (Addendum)
Clinical/Bedside Swallow Evaluation Patient Details  Name: Justin Griffin MRN: [id number] Date of Birth: 1941/06/26  Today's Date: 01/18/2020 Time: SLP Start Time (ACUTE ONLY): 36 SLP Stop Time (ACUTE ONLY): 1238 SLP Time Calculation (min) (ACUTE ONLY): 18 min  Past Medical History:  Past Medical History:  Diagnosis Date  . Alzheimer's disease ([place])   . CAD (coronary artery disease)    Catheterization 2006 LAD occluded, OM 95% stenosis followed by mid occlusion, first obtuse marginal occluded, right coronary artery diffuse moderate disease. LIMA to the LAD was patent. Saphenous vein to PDA and posterior lateral patent with diffuse luminal irregularities, saphenous vein graft to second obtuse marginal is patent, saphenous vein graft to the first obtuse marginal had 25% stenosis.  . Esophageal reflux   . Esophageal stricture   . Other and unspecified hyperlipidemia   . Other dysphagia   . Rheumatoid arthritis(714.0)   . Status post dilation of esophageal narrowing   . Unspecified essential hypertension    Past Surgical History:  Past Surgical History:  Procedure Laterality Date  . CATARACT EXTRACTION, BILATERAL    . CORONARY ARTERY BYPASS GRAFT  1996  . INGUINAL HERNIA REPAIR Right 04/05/2017   Procedure: OPEN RIGHT INGUINAL HERNIA REPAIR;  Surgeon: Alphonsa Overall, MD;  Location: [facility] ORS;  Service: General;  Laterality: Right;  . INSERTION OF MESH Right 04/05/2017   Procedure: INSERTION OF MESH;  Surgeon: Alphonsa Overall, MD;  Location: [facility] ORS;  Service: General;  Laterality: Right;  . left digit amputation     HPI:  79yo male admitted from [facility] 01/14/20 with abdominal distention and diarrhea. PMH: Alzheimer's dementia (nonverbal at baseline), HTN, HLD, CAD, GERD, RA, dilation.   Assessment / Plan / Recommendation Clinical Impression  Pt was seen for clinical swallow evaluation at bedside. He was awake and responsive, but did not open his eyes or follow commands. Nonverbal. Pt  is edentulous. Pt accepted trials of thin liquid via straw, and puree via spoon. No obvious oral issues - no anterior leakage of observable residue. No overt s/s aspiration on either consistency tested (puree/thin is baseline diet per RN). Recommend dys 1 (puree) diet and thin liquids, crushed meds. Safe swallow precautions posted at [facility]. No further ST intervention recommended at this time. RN and MD informed. Please reconsult if needs arise.    SLP Visit Diagnosis: Dysphagia, unspecified (R13.10)    Aspiration Risk  Mild aspiration risk    Diet Recommendation Dysphagia 1 (Puree);Thin liquid   Liquid Administration via: Straw Medication Administration: Crushed with puree Supervision: Full supervision/cueing for compensatory strategies Compensations: Minimize environmental distractions;Slow rate;Small sips/bites Postural Changes: Seated upright at 90 degrees;Remain upright for at least 30 minutes after po intake    Other  Recommendations Oral Care Recommendations: Oral care QID       Prognosis Prognosis for Safe Diet Advancement: Guarded Barriers to Reach Goals: Cognitive deficits      Swallow Study   General Date of Onset: 01/14/20 HPI: 79yo male admitted from [facility] 01/14/20 with abdominal distention and diarrhea. PMH: Alzheimer's dementia (nonverbal at baseline), HTN, HLD, CAD, GERD, RA, dilation. Type of Study: Bedside Swallow Evaluation Previous Swallow Assessment: BSE June 2020 = clear liquids. Tolerating puree/thin prior to this admit Diet Prior to this Study: NPO Temperature Spikes Noted: No Respiratory Status: Room air History of Recent Intubation: No Behavior/Cognition: Doesn't follow directions;Other (Comment)(nonverbal at baseline) Oral Cavity Assessment: Within Functional Limits Oral Care Completed by SLP: No Oral Cavity - Dentition: Edentulous Vision:  Impaired for self-feeding Self-Feeding Abilities: Total assist Patient Positioning: Upright in  bed Baseline Vocal Quality: Not observed Volitional Cough: Cognitively unable to elicit Volitional Swallow: Unable to elicit    Oral/Motor/Sensory Function Overall Oral Motor/Sensory Function: (unable to fully assess), but appears adequate for safe swallow  Ice Chips Ice chips: Not tested   Thin Liquid Thin Liquid: Within functional limits Presentation: Straw    Nectar Thick Nectar Thick Liquid: Not tested   Honey Thick Honey Thick Liquid: Not tested   Puree Puree: Within functional limits Presentation: Spoon   Solid     Solid: Not tested     Celia B. Quentin Ore, [facility], [place] Speech Language Pathologist Office: [phone] Pager: [phone]  Shonna Chock 01/18/2020,12:38 PM

## 2020-01-19 ENCOUNTER — Encounter (HOSPITAL_COMMUNITY): Payer: Self-pay | Admitting: Internal Medicine

## 2020-01-19 ENCOUNTER — Inpatient Hospital Stay (HOSPITAL_COMMUNITY): Payer: Medicare (Managed Care)

## 2020-01-19 ENCOUNTER — Other Ambulatory Visit: Payer: Self-pay

## 2020-01-19 LAB — BASIC METABOLIC PANEL WITH GFR
Anion gap: 9 (ref 5–15)
BUN: <5 mg/dL — ABNORMAL LOW (ref 8–23)
CO2: 23 mmol/L (ref 22–32)
Calcium: 8.8 mg/dL — ABNORMAL LOW (ref 8.9–10.3)
Chloride: 106 mmol/L (ref 98–111)
Creatinine, Ser: 0.75 mg/dL (ref 0.61–1.24)
GFR calc Af Amer: >60 mL/min
GFR calc non Af Amer: >60 mL/min
Glucose, Bld: 94 mg/dL (ref 70–99)
Potassium: 3.7 mmol/L (ref 3.5–5.1)
Sodium: 138 mmol/L (ref 135–145)

## 2020-01-19 LAB — MAGNESIUM: Magnesium: 2 mg/dL (ref 1.7–2.4)

## 2020-01-19 LAB — SARS CORONAVIRUS 2 (TAT 6-24 HRS): SARS Coronavirus 2: NEGATIVE

## 2020-01-19 LAB — GLUCOSE, CAPILLARY: Glucose-Capillary: 85 mg/dL (ref 70–99)

## 2020-01-19 MED ORDER — ENSURE ENLIVE PO LIQD
237.0000 mL | Freq: Two times a day (BID) | ORAL | Status: DC
  Administered 2020-01-20: 237 mL via ORAL

## 2020-01-19 NOTE — NC FL2 (Addendum)
[place] LEVEL OF CARE SCREENING TOOL     IDENTIFICATION  Patient Name: Justin Griffin Birthdate: [birth year] Sex: male Admission Date (Current Location): 01/14/2020  [facility] and [place] Number:  Herbalist and Address:  [facility],  [address] [place], [place]      Provider Number: [id number]  Attending Physician Name and Address:  Mariel Aloe, MD  Relative Name and Phone Number:  Edwards, Mindel Daughter [phone]  [phone]    Current Level of Care: Hospital Recommended Level of Care: [place] Prior Approval Number:    Date Approved/Denied:   PASRR Number:    Discharge Plan: SNF    Current Diagnoses: Patient Active Problem List   Diagnosis Date Noted  . 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 Ileus ([place]) 01/14/2020  . Adynamic ileus ([place]) 03/21/2019  . Dementia without behavioral disturbance ([place])   . Goals of care, counseling/discussion   . Palliative care by specialist   . Pressure injury of skin 03/20/2019  . Abdominal distention   . Sepsis ([place]) 03/19/2019  . Acute hypernatremia 03/19/2019  . Hypokalemia 03/19/2019  . Anemia 03/19/2019  . Dehydration   . Delirium 06/11/2017  . Acute kidney injury ([place]) 06/11/2017  . Fever 06/11/2017  . Effusion of right knee 06/11/2017  . Inguinal hernia 12/24/2016  . Testicular pain, left 08/07/2016  . Hemorrhoid 08/07/2016  . 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[place]) 07/24/2016  . Routine general medical examination at a health care facility 01/27/2016  . Medicare annual wellness visit, subsequent 01/27/2016  . Essential hypertension, benign 08/08/2009  . GERD 07/04/2008  . RHEUMATOID ARTHRITIS 07/04/2008  . WEIGHT LOSS-ABNORMAL 07/04/2008  . DYSPHAGIA 07/04/2008  . TOBACCO USER 07/03/2008  . POLYP, COLON 02/04/2008  . DYSLIPIDEMIA 02/04/2008  . Essential hypertension 02/04/2008  . Coronary artery disease 02/04/2008  . POLYPECTOMY, HX OF  02/04/2008  . CORONARY ARTERY BYPASS GRAFT, HX OF 02/04/2008    Orientation RESPIRATION BLADDER Height & Weight     (Disoriented x4.)  Normal Incontinent Weight: 150 lb 5.7 oz (68.2 kg) Height:     BEHAVIORAL SYMPTOMS/MOOD NEUROLOGICAL BOWEL NUTRITION STATUS      Incontinent Diet(Dysphagia 1 (Puree);Thin liquid   AMBULATORY STATUS COMMUNICATION OF NEEDS Skin   Total Care Verbally PU Stage and Appropriate Care(Unstageable Pressure Injury Left Ankle.)                       Personal Care Assistance Level of Assistance  Bathing, Feeding, Dressing Bathing Assistance: Maximum assistance Feeding assistance: Maximum assistance Dressing Assistance: Maximum assistance     Functional Limitations Info  Sight, Speech, Hearing Sight Info: Adequate Hearing Info: Adequate Speech Info: Impaired    SPECIAL CARE FACTORS FREQUENCY                       Contractures      Additional Factors Info  Code Status, Allergies, Psychotropic Code Status Info: DNR Allergies Info: Allergies: Ace Inhibitors, Lisinopril, Zocor Simvastatin           Current Medications (01/19/2020):  This is the current hospital active medication list Current Facility-Administered Medications  Medication Dose Route Frequency Provider Last Rate Last Admin  . acetaminophen (TYLENOL) tablet 650 mg  650 mg Oral Q6H PRN Truddie Hidden, MD       Or  . acetaminophen (TYLENOL) suppository 650 mg  650 mg Rectal Q6H PRN Truddie Hidden, MD      .  bisacodyl (DULCOLAX) suppository 10 mg  10 mg Rectal Daily Willia Craze, NP   10 mg at 01/18/20 1217  . brimonidine (ALPHAGAN) 0.2 % ophthalmic solution 1 drop  1 drop Left Eye BID Truddie Hidden, MD   1 drop at 01/19/20 0915   And  . timolol (TIMOPTIC) 0.5 % ophthalmic solution 1 drop  1 drop Left Eye BID Truddie Hidden, MD   1 drop at 01/19/20 0919  . brinzolamide (AZOPT) 1 % ophthalmic suspension 1 drop  1 drop Left Eye TID Truddie Hidden, MD   1 drop at  01/19/20 [id number]  . dextrose 5 %-0.9 % sodium chloride infusion   Intravenous Continuous Shelly Coss, MD 75 mL/hr at 01/18/20 1502 New Bag at 01/18/20 1502  . heparin injection 5,000 Units  5,000 Units Subcutaneous Q12H Truddie Hidden, MD   5,000 Units at 01/19/20 0917  . labetalol (NORMODYNE) injection 10 mg  10 mg Intravenous Q2H PRN Vianne Bulls, MD   10 mg at 01/15/20 0619  . latanoprost (XALATAN) 0.005 % ophthalmic solution 1 drop  1 drop Both Eyes QHS Truddie Hidden, MD   1 drop at 01/18/20 2129  . sodium chloride flush (NS) 0.9 % injection 3 mL  3 mL Intravenous Q12H Truddie Hidden, MD   3 mL at 01/15/20 2203     Discharge Medications: Please see discharge summary for a list of discharge medications.  Relevant Imaging Results:  Relevant Lab Results:   Additional Information SS# [id number]  [place], LCSW

## 2020-01-19 NOTE — TOC Progression Note (Signed)
Transition of Care [facility]) - Progression Note    Patient Details  Name: KALE SIBOLE MRN: [id number] Date of Birth: September 26, 1941  Transition of Care [facility]) CM/SW [place], LCSW Phone Number: 01/19/2020, 11:12 AM  Clinical Narrative:    FL2 completed. CSW confirm SNF will accept the patient, pending covid results.  CSW notified social Designer, television/film set.   Patient cannot safely transport in wheelchair Lucianne Lei, therefore PTAR will be arranged.  D/C summary will be faxed to: [phone] ([facility]) at discharge.  Patient daughter [name] notified.    Expected Discharge Plan: Skilled Nursing Facility Barriers to Discharge: Other (comment)(Covid test pending)  Expected Discharge Plan and Services Expected Discharge Plan: [place] In-house Referral: Hospice / [place] Acute Care Choice: [place] arrangements for the past 2 months: [place]                                       Social Determinants of Health (SDOH) Interventions    Readmission Risk Interventions Readmission Risk Prevention Plan 03/20/2019  Transportation Screening Complete  PCP or Specialist Appt within 3-5 Days Complete  HRI or [place] Complete  Social Work Consult for [place] Planning/Counseling Complete  Palliative Care Screening Not Applicable  Medication Review Press photographer) Complete  Some recent data might be hidden

## 2020-01-19 NOTE — Progress Notes (Signed)
PROGRESS NOTE    Justin Griffin  [id number] DOB: 02-Jul-1941 DOA: 01/14/2020 PCP: [facility] [place]   Brief Narrative: Justin Griffin is a 79 y.o. male with history of Alzheimer's disease/advanced dementia, hypertension, hyperlipidemia, coronary disease, GERD, arthritis. Patient presented   Assessment & Plan:   Principal Problem:   Ileus ([place]) Active Problems:   Essential hypertension   Dementia without behavioral disturbance (HCC)   Ogilvie syndrome   Colonic Ileus Some improvement with liquid stools but still with significant distension. MOST form reviewed; no feeding tube for permament or temporary reasons. Will need to focus on by mouth nutrition. Per nursing, patient able to eat if he is fed. SLP evaluated and is recommending a dysphagia 1 diet. Patient tolerating diet well when being fed. -GI recommendations: no endoscopic decompression at this time. Advance diet -Palliative care recommendations: palliative care consult on discharge, continue current scope of care. Discussed with outpatient PCP who will resume palliative care management; will not need palliative care consult on discharge.  Hypokalemia Hypophosphatemia Resolved.  Advanced dementia Nonverbal at baseline. Does not follow commands. Stable.  Microcytic anemia Baseline hemoglobin of 9. Stable.  Essential hypertension Not very well controlled. Not on treatment as an outpatient. -Continue to allow some hypertension; will treat if consistently with SBP >180 and DBP >100 mmHg  History of CAD History of CABG. On aspirin as an outpatient which has been held.  GERD -Continue Protonix  Poor nutritional intake Concerning in setting of dementia. Hopefully patient will have improved nutritional intake. If not, will need further goals of care discussions. -Dysphagia 1 diet   DVT prophylaxis: Heparin subq Code Status:   Code Status: DNR Family Communication: Daughter on  telephone Disposition Plan: Discharge back to SNF pending negative COVID test and available bed.   Consultants:   Gastroenterology  Palliative care medicine  Procedures:   None  Antimicrobials:  None    Subjective: Non-verbal  Objective: Vitals:   01/17/20 2151 01/18/20 1338 01/18/20 2119 01/19/20 0703  BP: (!) 169/85 (!) 155/94 (!) 161/97   Pulse: 68 80 71   Resp: 16 19 16    Temp: 98.1 F (36.7 C)  (!) 97.4 F (36.3 C)   TempSrc: Oral  Oral   SpO2: 100% 99% 96%   Weight:    68.2 kg    Intake/Output Summary (Last 24 hours) at 01/19/2020 1310 Last data filed at 01/19/2020 1024 Gross per 24 hour  Intake 600 ml  Output 2575 ml  Net -1975 ml   Filed Weights   01/15/20 0542 01/17/20 0606 01/19/20 0703  Weight: 66.4 kg 73.4 kg 68.2 kg    Examination:  General exam: Appears calm and comfortable Respiratory system: Clear to auscultation. Respiratory effort normal. Cardiovascular system: S1 & S2 heard, RRR. No murmurs, rubs, gallops or clicks. Gastrointestinal system: Abdomen is distended, soft and nontender. Normal bowel sounds heard. Central nervous system: Alert Extremities: No edema. No calf tenderness Skin: No cyanosis. No rashes Psychiatry: Judgement and insight appear impaired.    Data Reviewed: I have personally reviewed following labs and imaging studies  CBC: Recent Labs  Lab 01/14/20 0127 01/15/20 0444 01/16/20 0622  WBC 6.6 5.0 6.5  NEUTROABS  --   --  3.6  HGB 9.8* 9.3* 9.5*  HCT 32.8* 31.4* 32.1*  MCV 69.5* 69.8* 70.1*  PLT 302 281 [id number]   Basic Metabolic Panel: Recent Labs  Lab 01/15/20 0444 01/15/20 1911 01/16/20 0622 01/17/20 0436 01/17/20 0446 01/18/20 0459 01/18/20  1652 01/19/20 0440  NA 143  --  142  --  140 140  --  138  K 2.9*   < > 3.5  --  3.3* 3.1* 3.2* 3.7  CL 105  --  107  --  108 105  --  106  CO2 26  --  25  --  25 26  --  23  GLUCOSE 79  --  94  --  105* 105*  --  94  BUN 7*  --  6*  --  5* <5*  --  <5*   CREATININE 0.70  --  0.74  --  0.68 0.83  --  0.75  CALCIUM 8.4*  --  8.9  --  8.7* 8.9  --  8.8*  MG 2.2  --  2.0 2.0  --  2.0 1.9 2.0  PHOS  --   --  2.0*  --  3.0 2.8  --   --    < > = values in this interval not displayed.   GFR: CrCl cannot be calculated (Unknown ideal weight.). Liver Function Tests: Recent Labs  Lab 01/14/20 0127  AST 20  ALT 7  ALKPHOS 62  BILITOT 0.7  PROT 8.1  ALBUMIN 3.8   Recent Labs  Lab 01/14/20 0127  LIPASE 24   No results for input(s): AMMONIA in the last 168 hours. Coagulation Profile: No results for input(s): INR, PROTIME in the last 168 hours. Cardiac Enzymes: No results for input(s): CKTOTAL, CKMB, CKMBINDEX, TROPONINI in the last 168 hours. BNP (last 3 results) No results for input(s): PROBNP in the last 8760 hours. HbA1C: No results for input(s): HGBA1C in the last 72 hours. CBG: Recent Labs  Lab 01/18/20 0007 01/18/20 0620 01/18/20 1227 01/18/20 1730 01/19/20 0729  GLUCAP 88 80 104* 85 85   Lipid Profile: No results for input(s): CHOL, HDL, LDLCALC, TRIG, CHOLHDL, LDLDIRECT in the last 72 hours. Thyroid Function Tests: No results for input(s): TSH, T4TOTAL, FREET4, T3FREE, THYROIDAB in the last 72 hours. Anemia Panel: No results for input(s): VITAMINB12, FOLATE, FERRITIN, TIBC, IRON, RETICCTPCT in the last 72 hours. Sepsis Labs: No results for input(s): PROCALCITON, LATICACIDVEN in the last 168 hours.  No results found for this or any previous visit (from the past 240 hour(s)).       Radiology Studies: DG Abd 1 View  Result Date: 01/19/2020 CLINICAL DATA:  79 year old male with history of abdominal pain. EXAM: ABDOMEN - 1 VIEW COMPARISON:  Abdominal radiograph 01/17/2020. FINDINGS: Large amount of gas and stool noted throughout the colon. No pathologic dilatation of small bowel loops. No pneumoperitoneum. IMPRESSION: Unchanged bowel gas pattern, again most suggestive of colonic ileus. Electronically Signed   By:  Vinnie Langton M.D.   On: 01/19/2020 07:48   DG Abd 2 Views  Result Date: 01/17/2020 CLINICAL DATA:  79 year old male with diarrhea after Dulcolax suppository. Hypoactive bowel sounds. EXAM: ABDOMEN - 2 VIEW COMPARISON:  CT Abdomen and Pelvis 01/14/2020. Abdominal radiographs 01/16/2020 and earlier. FINDINGS: Upright and supine views at 1627 hours. Negative lung bases. No pneumoperitoneum identified. Stable bowel gas pattern from the recent CT with gas-filled, dilated large bowel, but no dilated small bowel loops. Stable visualized osseous structures.  Prior CABG. IMPRESSION: No free air. Unchanged bowel gas pattern from the CT 01/14/2020, favor decreased colonic inertia/institutional bowel. Electronically Signed   By: Genevie Ann M.D.   On: 01/17/2020 18:49        Scheduled Meds: . bisacodyl  10 mg Rectal  Daily  . brimonidine  1 drop Left Eye BID   And  . timolol  1 drop Left Eye BID  . brinzolamide  1 drop Left Eye TID  . heparin  5,000 Units Subcutaneous Q12H  . latanoprost  1 drop Both Eyes QHS  . sodium chloride flush  3 mL Intravenous Q12H   Continuous Infusions: . dextrose 5 % and 0.9% NaCl 75 mL/hr at 01/18/20 1502     LOS: 4 days     Cordelia Poche, MD [facility] 01/19/2020, 1:10 PM  If 7PM-7AM, please contact night-coverage [email or web address]

## 2020-01-19 NOTE — Progress Notes (Signed)
Family contact on patient chart, [name] (daughter), was called to attempt to finish admission questions. Voicemail left.

## 2020-01-19 NOTE — Progress Notes (Signed)
[name], daughter, reached out to RN for an update. Update given. RN was asked to have MD contact her with further update. [place] contacted with contact info.

## 2020-01-20 LAB — GLUCOSE, CAPILLARY
Glucose-Capillary: 104 mg/dL — ABNORMAL HIGH (ref 70–99)
Glucose-Capillary: 118 mg/dL — ABNORMAL HIGH (ref 70–99)

## 2020-01-20 MED ORDER — BISACODYL 10 MG RE SUPP: 10.0000 mg | Freq: Every day | RECTAL | 0 refills | Status: AC

## 2020-01-20 NOTE — Discharge Planning (Addendum)
Patient IV removed.  Discharge papers printed and placed in SNF packet. Report called and s/w Scherrie Bateman, LPN at [facility].  No scripts needed. Signed [place] included. Family aware and at bedside. PTAR contacted to transport back to SNF- waiting on arrival.

## 2020-01-20 NOTE — Discharge Instructions (Signed)
Ileus  Ileus is a condition that happens when the intestines, which are also called bowels, stop working correctly. The intestines are hollow organs that digest food after the food leaves the stomach. These organs are long, muscular tubes that connect the stomach to the rectum. When ileus occurs, the muscular contractions that cause food to move through the intestines do not happen as they normally would. If the intestines stop working, food cannot pass through to get digested. This condition is a serious problem that usually requires hospitalization. It can cause symptoms such as nausea, abdominal pain, and bloating. Ileus can last from a few hours to a few days. What are the causes? This condition may be caused by:  Surgery on the abdomen.  An infection or inflammation in the abdomen. This includes inflammation of the lining of the abdomen (peritonitis).  Infection or inflammation in other parts of the body, such as pneumonia or pancreatitis.  Passage of gallstones or kidney stones.  Damage to the nerves or blood vessels that go to the intestines.  A collection of blood within the abdominal cavity.  Imbalance in the salts in the blood (electrolytes).  Injury to the brain or spinal cord.  Medicines. Many medicines, including strong pain medicines, can cause ileus or make it worse. If the intestines stop working because of a blockage, that is a different condition that is called a bowel obstruction. What are the signs or symptoms? Symptoms of this condition include:  Bloating of the abdomen.  Pain or discomfort in the abdomen.  Poor appetite.  Nausea and vomiting.  Lack of normal bowel sounds, such as "growling" in the stomach. How is this diagnosed? This condition may be diagnosed with:  A physical exam and medical history.  X-rays or a CT scan of the abdomen. You may also have other tests to help find the cause of the condition. How is this treated? This condition may  be treated by:  Resting the intestines until they start to work again. This is often done by: ? Stopping oral intake of food and drink. You will be given fluid through an IV to prevent dehydration. ? Placing a small tube (nasogastric tube or NG tube) that is passed through your nose and into your stomach. The tube is attached to a suction device and keeps the stomach emptied out. This allows the bowels to rest and helps to reduce nausea and vomiting.  Correcting any electrolyte imbalance by giving supplements in the IV fluid.  Stopping any medicines that might make ileus worse.  Treating any condition that may have caused ileus. Follow these instructions at home: Eating and drinking   Follow instructions from your health care provider about: ? What to eat and drink. You may be told to start eating a bland diet. Over time, you may slowly resume a more normal, healthy diet. ? How much to eat and drink. You should eat small meals often and stop eating when you feel full.  Avoid alcohol. General instructions  Take over-the-counter and prescription medicines only as told by your health care provider.  Rest as told by your health care provider.  Avoid sitting for a long time without moving. Get up to take short walks every 1-2 hours. Ask for help if you feel weak or unsteady.  Keep all follow-up visits as told by your health care provider. This is important. Contact a health care provider if:  You have nausea, vomiting, or abdominal discomfort.  You have a fever. Get help  right away if:  You have severe abdominal pain or bloating.  You cannot eat or drink without vomiting. Summary  Ileus is a condition that happens when the intestines, which are also called bowels, stop working correctly.  When ileus occurs, the muscular contractions that cause food to move through the intestines do not happen as they normally would.  Ileus can cause symptoms such as nausea, abdominal pain, and  bloating.  Treatment may involve getting IV fluids and having a nasogastric tube placed to keep your stomach emptied out until the intestines start working again. This information is not intended to replace advice given to you by your health care provider. Make sure you discuss any questions you have with your health care provider. Document Revised: 01/17/2018 Document Reviewed: 01/17/2018 Elsevier Patient Education  [place].

## 2020-01-20 NOTE — Discharge Summary (Signed)
Physician Discharge Summary  OSVIN TYNER [id number] DOB: 12-Aug-1941 DOA: 01/14/2020  PCP: [facility] [place] date: 01/14/2020 Discharge date: 01/20/2020  Admitted From: SNF Disposition: SNF  Recommendations for Outpatient Follow-up:  1. Continued palliative discussions per PCP 2. Can recheck BMP in 3-5 days to ensure stable potassium; if low, may need to continue potassium supplementation 3. Please follow up on the following pending results: None  Discharge Condition: Stable CODE STATUS: DNR Diet recommendation:    Dysphagia 1 (Puree);Thin liquid   Liquid Administration via: Straw Medication Administration: Crushed with puree Supervision: Full supervision/cueing for compensatory strategies Compensations: Minimize environmental distractions;Slow rate;Small sips/bites Postural Changes: Seated upright at 90 degrees;Remain upright for at least 30 minutes after po intake     Brief/Interim Summary:  Admission HPI written by Truddie Hidden, MD   Chief Complaint: Abdominal distention and reported diarrhea  HPI: Justin Griffin is a 79 y.o. male with medical history significant of Alzheimer's disease, hypertension, hyperlipidemia, CAD, GERD, and rheumatoid arthritis who presents from his nursing home with abdominal distention and diarrhea.  Patient is nonverbal at baseline and unable to provide any history.  Spoke with the patient's nurse.  Per the AM nurse, night shift nurse reported worsening abdominal distension over the past few days.  The patient has been having several days of loose stools, initially thought to be iatrogenic from stool softeners, but then it persisted.  He has not been on antibiotics recently.  Per the nurse no on the floor has had C. difficile recently.  Nurse reports he occasionally opens his eyes.  He is non-verbal.  The CNA at the facility feeds him and he normally eats 100% of his meals.  He is bed bound at  baseline.  He spontaneously moves his arms and legs but does not follow any commands.  Patient is DNR/DNI and per their report he has been transitioning to more of a comfort measures but he is not officially hospice ([facility] is the organization).   Spoke with patient's daughter Justin Griffin regarding admission and probably goals of care.  She confirmed that he would indeed be DNR/DNI and confirms her prior discussions regarding hospice but they did not feel that was necessary at that time.  She would like to proceed with acute medical care but if patient were to decline she thinks hospice would be appropriate but would want to discuss with her sister Justin Griffin.  We also discussed if he improves and were to go home, should he develop another reason that would prompt facility to bring him back to the ER would they want him to come to the hospital or potentially just be treated for symptoms at home.  She said she understood the discussion and thought that might be appropriate but at this time would want to continue acute medical care.    Hospital course:  Colonic Ileus Some improvement with liquid stools but still with significant distension. MOST form reviewed; no feeding tube for permament or temporary reasons. Gastroenterology consulted and per discussion with family, no endoscopic decompression was pursued. Patient managed on suppositories. Patient having bowel movements daily without evidence of symptoms from ileus. No emesis. Tolerating diet. Continue Dulcolax suppositories on discharge.  Hypokalemia Hypophosphatemia Resolved.  Advanced dementia Nonverbal at baseline. Does not follow commands. Stable.  Microcytic anemia Baseline hemoglobin of 9. Stable.  Essential hypertension Not very well controlled. Not on treatment as an outpatient. Will defer to outpatient PCP to consider risks/benefits.  History of CAD History of CABG. On aspirin as an outpatient which has been  held.  GERD Continue Protonix  Poor nutritional intake In setting of dementia. Patient needs to be fed. Dysphagia 1 diet.  Discharge Diagnoses:  Principal Problem:   Ileus [facility]) Active Problems:   Essential hypertension   Dementia without behavioral disturbance ([place])   Ogilvie syndrome    Discharge Instructions   Allergies as of 01/20/2020      Reactions   Ace Inhibitors Other (See Comments)   "Allergic," per paperwork from [facility]   Lisinopril Other (See Comments)   "Allergic," per paperwork from [facility]   Zocor [simvastatin] Other (See Comments)   Muscle pain      Medication List    TAKE these medications   aspirin EC 81 MG tablet Take 81 mg by mouth daily.   bisacodyl 10 MG suppository Commonly known as: DULCOLAX Place 1 suppository (10 mg total) rectally daily.   brimonidine-timolol 0.2-0.5 % ophthalmic solution Commonly known as: COMBIGAN Place 1 drop into the left eye every 12 (twelve) hours.   brinzolamide 1 % ophthalmic suspension Commonly known as: AZOPT Place 1 drop into the left eye 3 (three) times daily.   latanoprost 0.005 % ophthalmic solution Commonly known as: XALATAN Place 1 drop into both eyes at bedtime.   NON FORMULARY Take 1 Can by mouth See admin instructions. Mighty Shake: Drink 1 can by mouth three times a day- 8:30 AM/11:30 AM/6 PM   pantoprazole 40 MG tablet Commonly known as: PROTONIX Take 40 mg by mouth daily before breakfast.      Contact information for after-discharge care    [facility] .   Service: Skilled Nursing Contact information: [place] [address] [phone]             Allergies  Allergen Reactions   Ace Inhibitors Other (See Comments)    "Allergic," per paperwork from [facility]   Lisinopril Other (See Comments)    "Allergic," per paperwork from [facility]   Zocor [Simvastatin] Other (See Comments)    Muscle pain     Consultations:  Gastroenterology  Palliative care   Procedures/Studies: DG Abd 1 View  Result Date: 01/19/2020 CLINICAL DATA:  79 year old male with history of abdominal pain. EXAM: ABDOMEN - 1 VIEW COMPARISON:  Abdominal radiograph 01/17/2020. FINDINGS: Large amount of gas and stool noted throughout the colon. No pathologic dilatation of small bowel loops. No pneumoperitoneum. IMPRESSION: Unchanged bowel gas pattern, again most suggestive of colonic ileus. Electronically Signed   By: Vinnie Langton M.D.   On: 01/19/2020 07:48   DG Abd 1 View  Result Date: 01/16/2020 CLINICAL DATA:  Ileus EXAM: ABDOMEN - 1 VIEW COMPARISON:  January 15, 2020. FINDINGS: There remains prominence of the sigmoid colon, although the sigmoid colon is slightly less distended than 1 day prior. There is moderate stool in the colon. No free air noted. Visualized lung bases clear. There are vascular calcifications in the pelvis. IMPRESSION: Sigmoid distension, slightly less prominent than 1 day prior. Suspect colonic ileus as most likely etiology, although patient is at risk for sigmoid volvulus. Close clinical and imaging surveillance advised in this regard. No free air evident. Electronically Signed   By: Lowella Grip III M.D.   On: 01/16/2020 07:58   DG Abd 1 View  Result Date: 01/15/2020 CLINICAL DATA:  Ileus EXAM: ABDOMEN - 1 VIEW COMPARISON:  Abdominal CT from yesterday FINDINGS: Generalized  gaseous distension of colon. The sigmoid segment is maximally dilated and is 18 cm in diameter. There is no obstructive process or twist on preceding abdominal CT. IMPRESSION: Colonic ileus pattern with progressive sigmoid distension, now up to 18 cm in diameter. Electronically Signed   By: Monte Fantasia M.D.   On: 01/15/2020 06:21   CT Abdomen Pelvis W Contrast  Result Date: 01/14/2020 CLINICAL DATA:  Distended abdomen. EXAM: CT ABDOMEN AND PELVIS WITH CONTRAST TECHNIQUE: Multidetector CT imaging of the abdomen and  pelvis was performed using the standard protocol following bolus administration of intravenous contrast. CONTRAST:  170mL OMNIPAQUE IOHEXOL 300 MG/ML  SOLN COMPARISON:  Plain film same day plain film 03/21/2019 FINDINGS: Lower chest: Lung bases are clear. Hepatobiliary: No focal hepatic lesion. No biliary duct dilatation. Gallbladder is normal. Common bile duct is normal. Pancreas: Pancreas is normal. No ductal dilatation. No pancreatic inflammation. Spleen: Normal spleen Adrenals/urinary tract: Adrenal glands and kidneys are normal. The ureters and bladder normal. Stomach/Bowel: The stomach is compressed posteriorly by the dilated loops of colon. Small bowel is grossly normal. No evidence of small bowel dilatation. Cecum is stool filled. Ascending colon is stool filled. The transverse colon is markedly gas distended up to 8.4 cm. The proximal descending colon relatively collapsed. The sigmoid colon is markedly dilated up to 9.3 cm. There is no twisting of the sigmoid colon. The distended sigmoid colon extends to the rectum with there is fluid in the rectum. No obstructing lesion identified. No evidence of twisting of the sigmoid colon to suggest volvulus. No intraperitoneal free air. Vascular/Lymphatic: Abdominal aorta is normal caliber with atherosclerotic calcification. There is no retroperitoneal or periportal lymphadenopathy. No pelvic lymphadenopathy. Reproductive: Prostate normal Other: No intraperitoneal free air no free fluid. Musculoskeletal: No aggressive osseous lesion. IMPRESSION: Markedly gas distended sigmoid colon without obstruction lesion identified. Gas distension of the transverse colon and large volume stool in the ascending colon. Findings are most suggestive severe colonic ileus/institutional bowel. No intraperitoneal free air or fluid. Electronically Signed   By: Suzy Bouchard M.D.   On: 01/14/2020 05:53   DG Abd 2 Views  Result Date: 01/17/2020 CLINICAL DATA:  79 year old male with  diarrhea after Dulcolax suppository. Hypoactive bowel sounds. EXAM: ABDOMEN - 2 VIEW COMPARISON:  CT Abdomen and Pelvis 01/14/2020. Abdominal radiographs 01/16/2020 and earlier. FINDINGS: Upright and supine views at 1627 hours. Negative lung bases. No pneumoperitoneum identified. Stable bowel gas pattern from the recent CT with gas-filled, dilated large bowel, but no dilated small bowel loops. Stable visualized osseous structures.  Prior CABG. IMPRESSION: No free air. Unchanged bowel gas pattern from the CT 01/14/2020, favor decreased colonic inertia/institutional bowel. Electronically Signed   By: Genevie Ann M.D.   On: 01/17/2020 18:49   DG Abd Portable 2 Views  Result Date: 01/14/2020 CLINICAL DATA:  Abdominal distension. EXAM: PORTABLE ABDOMEN - 2 VIEW COMPARISON:  March 21, 2019 FINDINGS: Multiple markedly dilated, air-filled loops of large bowel are seen. This extends from the cecum to the rectum, with a mild to moderate amount of stool seen within the proximal to mid ascending colon. The small bowel loops are subsequently limited in visualization. There is no evidence of free air. No radio-opaque calculi or other significant radiographic abnormality is seen. IMPRESSION: Multiple markedly dilated, air-filled loops of large bowel, as described above. While this may be chronic in nature, a distal large bowel obstruction cannot be excluded. Correlation with abdomen pelvis CT is recommended. Electronically Signed   By: Joyce Gross.D.  On: 01/14/2020 03:05     Subjective: Non-verbal  Discharge Exam: Vitals:   01/20/20 0622 01/20/20 0751  BP: (!) 169/92 (!) 143/86  Pulse: 75 70  Resp:  16  Temp:  97.8 F (36.6 C)  SpO2: 100% 98%   Vitals:   01/19/20 2107 01/20/20 0549 01/20/20 0622 01/20/20 0751  BP: (!) 141/80 (!) 179/96 (!) 169/92 (!) 143/86  Pulse: 77 70 75 70  Resp: 18 17  16   Temp: 98 F (36.7 C) 97.6 F (36.4 C)  97.8 F (36.6 C)  TempSrc: Oral Oral  Oral  SpO2: 100% 91% 100%  98%  Weight:  70.4 kg    Height:        General: Pt is alert, awake, not in acute distress Cardiovascular: RRR, S1/S2 +, no rubs, no gallops Respiratory: CTA bilaterally, no wheezing, no rhonchi Abdominal: Soft, NT, Distended, bowel sounds + Extremities: no edema, no cyanosis    The results of significant diagnostics from this hospitalization (including imaging, microbiology, ancillary and laboratory) are listed below for reference.     Microbiology: Recent Results (from the past 240 hour(s))  SARS CORONAVIRUS 2 (TAT 6-24 HRS) Nasopharyngeal Nasopharyngeal Swab     Status: None   Collection Time: 01/19/20 10:38 AM   Specimen: Nasopharyngeal Swab  Result Value Ref Range Status   SARS Coronavirus 2 NEGATIVE NEGATIVE Final    Comment: (NOTE) SARS-CoV-2 target nucleic acids are NOT DETECTED. The SARS-CoV-2 RNA is generally detectable in upper and lower respiratory specimens during the acute phase of infection. Negative results do not preclude SARS-CoV-2 infection, do not rule out co-infections with other pathogens, and should not be used as the sole basis for treatment or other patient management decisions. Negative results must be combined with clinical observations, patient history, and epidemiological information. The expected result is Negative. Fact Sheet for Patients: SugarRoll.be Fact Sheet for Healthcare Providers: https://www.woods-mathews.com/ This test is not yet approved or cleared by the [place] FDA and  has been authorized for detection and/or diagnosis of SARS-CoV-2 by FDA under an Emergency Use Authorization (EUA). This EUA will remain  in effect (meaning this test can be used) for the duration of the COVID-19 declaration under Section 56 4(b)(1) of the Act, 21 U.S.C. section 360bbb-3(b)(1), unless the authorization is terminated or revoked sooner. Performed at [facility], [place] [address], [place],  [place] [address]      Labs: BNP (last 3 results) No results for input(s): BNP in the last 8760 hours. Basic Metabolic Panel: Recent Labs  Lab 01/15/20 0444 01/15/20 1911 01/16/20 0622 01/17/20 0436 01/17/20 0446 01/18/20 0459 01/18/20 1652 01/19/20 0440  NA 143  --  142  --  140 140  --  138  K 2.9*   < > 3.5  --  3.3* 3.1* 3.2* 3.7  CL 105  --  107  --  108 105  --  106  CO2 26  --  25  --  25 26  --  23  GLUCOSE 79  --  94  --  105* 105*  --  94  BUN 7*  --  6*  --  5* <5*  --  <5*  CREATININE 0.70  --  0.74  --  0.68 0.83  --  0.75  CALCIUM 8.4*  --  8.9  --  8.7* 8.9  --  8.8*  MG 2.2  --  2.0 2.0  --  2.0 1.9 2.0  PHOS  --   --  2.0*  --  3.0 2.8  --   --    < > = values in this interval not displayed.   Liver Function Tests: Recent Labs  Lab 01/14/20 0127  AST 20  ALT 7  ALKPHOS 62  BILITOT 0.7  PROT 8.1  ALBUMIN 3.8   Recent Labs  Lab 01/14/20 0127  LIPASE 24   No results for input(s): AMMONIA in the last 168 hours. CBC: Recent Labs  Lab 01/14/20 0127 01/15/20 0444 01/16/20 0622  WBC 6.6 5.0 6.5  NEUTROABS  --   --  3.6  HGB 9.8* 9.3* 9.5*  HCT 32.8* 31.4* 32.1*  MCV 69.5* 69.8* 70.1*  PLT 302 281 273   Cardiac Enzymes: No results for input(s): CKTOTAL, CKMB, CKMBINDEX, TROPONINI in the last 168 hours. BNP: Invalid input(s): POCBNP CBG: Recent Labs  Lab 01/18/20 0620 01/18/20 1227 01/18/20 1730 01/19/20 0729 01/20/20 0715  GLUCAP 80 104* 85 85 104*   D-Dimer No results for input(s): DDIMER in the last 72 hours. Hgb A1c No results for input(s): HGBA1C in the last 72 hours. Lipid Profile No results for input(s): CHOL, HDL, LDLCALC, TRIG, CHOLHDL, LDLDIRECT in the last 72 hours. Thyroid function studies No results for input(s): TSH, T4TOTAL, T3FREE, THYROIDAB in the last 72 hours.  Invalid input(s): FREET3 Anemia work up No results for input(s): VITAMINB12, FOLATE, FERRITIN, TIBC, IRON, RETICCTPCT in the last 72 hours. Urinalysis     Component Value Date/Time   COLORURINE YELLOW 01/14/2020 0127   APPEARANCEUR CLEAR 01/14/2020 0127   LABSPEC 1.020 01/14/2020 0127   PHURINE 7.0 01/14/2020 0127   GLUCOSEU NEGATIVE 01/14/2020 0127   HGBUR NEGATIVE 01/14/2020 0127   BILIRUBINUR NEGATIVE 01/14/2020 0127   KETONESUR NEGATIVE 01/14/2020 0127   PROTEINUR NEGATIVE 01/14/2020 0127   NITRITE NEGATIVE 01/14/2020 0127   LEUKOCYTESUR NEGATIVE 01/14/2020 0127   Sepsis Labs Invalid input(s): PROCALCITONIN,  WBC,  LACTICIDVEN Microbiology Recent Results (from the past 240 hour(s))  SARS CORONAVIRUS 2 (TAT 6-24 HRS) Nasopharyngeal Nasopharyngeal Swab     Status: None   Collection Time: 01/19/20 10:38 AM   Specimen: Nasopharyngeal Swab  Result Value Ref Range Status   SARS Coronavirus 2 NEGATIVE NEGATIVE Final    Comment: (NOTE) SARS-CoV-2 target nucleic acids are NOT DETECTED. The SARS-CoV-2 RNA is generally detectable in upper and lower respiratory specimens during the acute phase of infection. Negative results do not preclude SARS-CoV-2 infection, do not rule out co-infections with other pathogens, and should not be used as the sole basis for treatment or other patient management decisions. Negative results must be combined with clinical observations, patient history, and epidemiological information. The expected result is Negative. Fact Sheet for Patients: SugarRoll.be Fact Sheet for Healthcare Providers: https://www.woods-mathews.com/ This test is not yet approved or cleared by the [place] FDA and  has been authorized for detection and/or diagnosis of SARS-CoV-2 by FDA under an Emergency Use Authorization (EUA). This EUA will remain  in effect (meaning this test can be used) for the duration of the COVID-19 declaration under Section 56 4(b)(1) of the Act, 21 U.S.C. section 360bbb-3(b)(1), unless the authorization is terminated or revoked sooner. Performed at [facility], [place] [address] [place], [place] [address]      Time coordinating discharge: 35 minutes  SIGNED:   Cordelia Poche, MD [facility] 01/20/2020, 9:32 AM

## 2020-01-20 NOTE — TOC Transition Note (Addendum)
Transition of Care [facility]) - CM/SW Discharge Note   Patient Details  Name: Justin Griffin MRN: [id number] Date of Birth: [birth year]  [facility]) CM/SW Contact:  Lia Hopping, LCSW Phone Number: 01/20/2020, 10:08 AM   Clinical Narrative:    CSW notified the patient daughter [name] he will return to SNF today.  Facility ready to accept.   Nurse call report to: [facility].  [facility] called.   Discharge Summary faxed.   Final next level of care: Skilled Nursing Facility Barriers to Discharge: Barriers Resolved   Patient Goals and CMS Choice Patient states their goals for this hospitalization and ongoing recovery are:: Ongoing Care   Choice offered to / list presented to : NA  Discharge Placement              Patient [facility] bed at: [facility] Patient to be transferred to facility by: [place] Name of family member notified: Daughter Patient and family notified of of transfer: 01/20/20  Discharge Plan and Services In-house Referral: Hospice / [place] Acute Care Choice: [place]                               Social Determinants of Health (SDOH) Interventions     Readmission Risk Interventions Readmission Risk Prevention Plan 03/20/2019  Transportation Screening Complete  PCP or Specialist Appt within 3-5 Days Complete  HRI or [place] Complete  Social Work Consult for [place] Planning/Counseling Complete  Palliative Care Screening Not Applicable  Medication Review [employer]) Complete  Some recent data might be hidden

## 2020-05-13 ENCOUNTER — Emergency Department (HOSPITAL_COMMUNITY): Payer: Medicare (Managed Care)

## 2020-05-13 ENCOUNTER — Emergency Department (HOSPITAL_COMMUNITY)
Admission: EM | Admit: 2020-05-13 | Discharge: 2020-05-13 | Disposition: A | Discharge status: Home / Self Care | Payer: Medicare (Managed Care) | Attending: Emergency Medicine | Admitting: Emergency Medicine

## 2020-05-13 DIAGNOSIS — F0390 Unspecified dementia without behavioral disturbance: Secondary | ICD-10-CM | POA: Insufficient documentation

## 2020-05-13 DIAGNOSIS — Y999 Unspecified external cause status: Secondary | ICD-10-CM | POA: Diagnosis not present

## 2020-05-13 DIAGNOSIS — S0101XA Laceration without foreign body of scalp, initial encounter: Secondary | ICD-10-CM | POA: Diagnosis not present

## 2020-05-13 DIAGNOSIS — Z79899 Other long term (current) drug therapy: Secondary | ICD-10-CM | POA: Insufficient documentation

## 2020-05-13 DIAGNOSIS — Y92129 Unspecified place in nursing home as the place of occurrence of the external cause: Secondary | ICD-10-CM | POA: Diagnosis not present

## 2020-05-13 DIAGNOSIS — K567 Ileus, unspecified: Secondary | ICD-10-CM | POA: Diagnosis not present

## 2020-05-13 DIAGNOSIS — S0990XA Unspecified injury of head, initial encounter: Secondary | ICD-10-CM | POA: Diagnosis present

## 2020-05-13 DIAGNOSIS — I10 Essential (primary) hypertension: Secondary | ICD-10-CM | POA: Diagnosis not present

## 2020-05-13 DIAGNOSIS — E041 Nontoxic single thyroid nodule: Secondary | ICD-10-CM | POA: Diagnosis not present

## 2020-05-13 DIAGNOSIS — I2510 Atherosclerotic heart disease of native coronary artery without angina pectoris: Secondary | ICD-10-CM | POA: Diagnosis not present

## 2020-05-13 DIAGNOSIS — Z20822 Contact with and (suspected) exposure to covid-19: Secondary | ICD-10-CM | POA: Diagnosis not present

## 2020-05-13 DIAGNOSIS — W010XXA Fall on same level from slipping, tripping and stumbling without subsequent striking against object, initial encounter: Secondary | ICD-10-CM | POA: Diagnosis not present

## 2020-05-13 DIAGNOSIS — Z87891 Personal history of nicotine dependence: Secondary | ICD-10-CM | POA: Insufficient documentation

## 2020-05-13 DIAGNOSIS — Y9389 Activity, other specified: Secondary | ICD-10-CM | POA: Diagnosis not present

## 2020-05-13 LAB — URINALYSIS, ROUTINE W REFLEX MICROSCOPIC
Bilirubin Urine: NEGATIVE
Glucose, UA: NEGATIVE mg/dL
Hgb urine dipstick: NEGATIVE
Ketones, ur: NEGATIVE mg/dL
Leukocytes,Ua: NEGATIVE
Nitrite: NEGATIVE
Protein, ur: NEGATIVE mg/dL
Specific Gravity, Urine: 1.009 (ref 1.005–1.030)
pH: 5 (ref 5.0–8.0)

## 2020-05-13 LAB — COMPREHENSIVE METABOLIC PANEL
ALT: 10 U/L (ref 0–44)
AST: 13 U/L — ABNORMAL LOW (ref 15–41)
Albumin: 3.7 g/dL (ref 3.5–5.0)
Alkaline Phosphatase: 77 U/L (ref 38–126)
Anion gap: 8 (ref 5–15)
BUN: 9 mg/dL (ref 8–23)
CO2: 23 mmol/L (ref 22–32)
Calcium: 8.4 mg/dL — ABNORMAL LOW (ref 8.9–10.3)
Chloride: 102 mmol/L (ref 98–111)
Creatinine, Ser: 0.85 mg/dL (ref 0.61–1.24)
GFR calc Af Amer: >60 mL/min (ref >60)
GFR calc non Af Amer: >60 mL/min (ref >60)
Glucose, Bld: 97 mg/dL (ref 70–99)
Potassium: 3.1 mmol/L — ABNORMAL LOW (ref 3.5–5.1)
Sodium: 133 mmol/L — ABNORMAL LOW (ref 135–145)
Total Bilirubin: 0.6 mg/dL (ref 0.3–1.2)
Total Protein: 7.8 g/dL (ref 6.5–8.1)

## 2020-05-13 LAB — CBC
HCT: 32.4 % — ABNORMAL LOW (ref 39.0–52.0)
Hemoglobin: 9.8 g/dL — ABNORMAL LOW (ref 13.0–17.0)
MCH: 20.5 pg — ABNORMAL LOW (ref 26.0–34.0)
MCHC: 30.2 g/dL (ref 30.0–36.0)
MCV: 67.6 fL — ABNORMAL LOW (ref 80.0–100.0)
Platelets: 292 10*3/uL (ref 150–400)
RBC: 4.79 MIL/uL (ref 4.22–5.81)
RDW: 21.2 % — ABNORMAL HIGH (ref 11.5–15.5)
WBC: 6.9 10*3/uL (ref 4.0–10.5)
nRBC: 0 % (ref 0.0–0.2)

## 2020-05-13 LAB — PROTIME-INR
INR: 1.1 (ref 0.8–1.2)
Prothrombin Time: 13.7 seconds (ref 11.4–15.2)

## 2020-05-13 LAB — DIFFERENTIAL
Abs Immature Granulocytes: 0.03 10*3/uL (ref 0.00–0.07)
Basophils Absolute: 0 10*3/uL (ref 0.0–0.1)
Basophils Relative: 0 %
Eosinophils Absolute: 0.1 10*3/uL (ref 0.0–0.5)
Eosinophils Relative: 2 %
Immature Granulocytes: 0 %
Lymphocytes Relative: 19 %
Lymphs Abs: 1.3 10*3/uL (ref 0.7–4.0)
Monocytes Absolute: 0.8 10*3/uL (ref 0.1–1.0)
Monocytes Relative: 12 %
Neutro Abs: 4.6 10*3/uL (ref 1.7–7.7)
Neutrophils Relative %: 67 %

## 2020-05-13 LAB — LIPASE, BLOOD: Lipase: 24 U/L (ref 11–51)

## 2020-05-13 LAB — RAPID URINE DRUG SCREEN, HOSP PERFORMED
Amphetamines: NOT DETECTED
Barbiturates: NOT DETECTED
Benzodiazepines: NOT DETECTED
Cocaine: NOT DETECTED
Opiates: NOT DETECTED
Tetrahydrocannabinol: NOT DETECTED

## 2020-05-13 LAB — SARS CORONAVIRUS 2 BY RT PCR (HOSPITAL ORDER, PERFORMED IN ~~LOC~~ HOSPITAL LAB): SARS Coronavirus 2: NEGATIVE

## 2020-05-13 LAB — APTT: aPTT: 35 seconds (ref 24–36)

## 2020-05-13 MED ORDER — TETANUS-DIPHTH-ACELL PERTUSSIS 5-2.5-18.5 LF-MCG/0.5 IM SUSP: 0.5000 mL | Freq: Once | INTRAMUSCULAR | Status: DC

## 2020-05-13 MED ORDER — LIDOCAINE-EPINEPHRINE (PF) 2 %-1:200000 IJ SOLN
10.0000 mL | Freq: Once | INTRAMUSCULAR | Status: AC
  Administered 2020-05-13: 10 mL
  Filled 2020-05-13: qty 20

## 2020-05-13 MED ORDER — SODIUM CHLORIDE 0.9 % IV SOLN: 100.0000 mL/h | INTRAVENOUS | Status: DC

## 2020-05-13 MED ORDER — SODIUM CHLORIDE 0.9 % IV BOLUS
500.0000 mL | Freq: Once | INTRAVENOUS | Status: AC
  Administered 2020-05-13: 500 mL via INTRAVENOUS

## 2020-05-13 NOTE — Progress Notes (Addendum)
TOC CM received call from [facility] CSW, Mina. She was requesting update. Attempted call to and left HIPAA complaint message. Jonnie Finner RN CCM, [facility] ED TOC CM [phone]  Received call from [facility] and they will provide transportation back to [facility]. [place], [facility] ED TOC CM [phone]

## 2020-05-13 NOTE — ED Notes (Signed)
Pt unable to communicate and follow directions at baseline, unable to complete swallow screen.

## 2020-05-13 NOTE — ED Triage Notes (Signed)
Patient brought via [place] from [facility] for a "witnessed" fall from bed. EMS reports that patient is normally non-verbal and moaning, so patient is still at baseline. Patient has a laceration to top of head. EMS covered with dressing. EMS also reports patient incontient of stool and has a distended abdomen. EMS also states that patient has malodorous urine.

## 2020-05-13 NOTE — Discharge Instructions (Signed)
Staple removal in 7 days.  Follow-up with your primary care doctor to review the thyroid nodule findings.  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

## 2020-05-13 NOTE — ED Provider Notes (Signed)
[facility] DEPT Provider Note   CSN: [id number] Arrival date & time: 05/13/20  4967   Level 5 caveat: Dementia altered mental status  History Chief Complaint  Patient presents with  . Fall    Injury to head    Justin Griffin is a 79 y.o. male.  HPI   Patient presented to ED for evaluation of a witnessed fall.  Patient has history of dementia.  He resides in a nursing facility.  According to the EMS report the patient slipped and fell.  He struck his head.  Patient is nonverbal at baseline.  He resides at [facility] skilled nursing facility.  According to the EMS report the patient's mental status is at his baseline.  He was noted to have a laceration to his scalp.  Patient also was noted to have abdominal distention.  Patient is unable to provide any history to me.  Past Medical History:  Diagnosis Date  . Alzheimer's disease ([place])   . CAD (coronary artery disease)    Catheterization 2006 LAD occluded, OM 95% stenosis followed by mid occlusion, first obtuse marginal occluded, right coronary artery diffuse moderate disease. LIMA to the LAD was patent. Saphenous vein to PDA and posterior lateral patent with diffuse luminal irregularities, saphenous vein graft to second obtuse marginal is patent, saphenous vein graft to the first obtuse marginal had 25% stenosis.  . Esophageal reflux   . Esophageal stricture   . Other and unspecified hyperlipidemia   . Other dysphagia   . Rheumatoid arthritis(714.0)   . Status post dilation of esophageal narrowing   . Unspecified essential hypertension     Patient Active Problem List   Diagnosis Date Noted  . 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 Ileus ([place]) 01/14/2020  . 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 Dementia without behavioral disturbance ([place])   . Goals of care, counseling/discussion   . Palliative care by specialist   . Pressure injury of skin 03/20/2019  . Abdominal distention   . Sepsis ([place]) 03/19/2019  . Acute  hypernatremia 03/19/2019  . Hypokalemia 03/19/2019  . Anemia 03/19/2019  . Dehydration   . Delirium 06/11/2017  . Acute kidney injury ([place]) 06/11/2017  . Fever 06/11/2017  . Effusion of right knee 06/11/2017  . Inguinal hernia 12/24/2016  . Testicular pain, left 08/07/2016  . Hemorrhoid 08/07/2016  . 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 Routine general medical examination at a health care facility 01/27/2016  . Medicare annual wellness visit, subsequent 01/27/2016  . Essential hypertension, benign 08/08/2009  . GERD 07/04/2008  . RHEUMATOID ARTHRITIS 07/04/2008  . WEIGHT LOSS-ABNORMAL 07/04/2008  . DYSPHAGIA 07/04/2008  . TOBACCO USER 07/03/2008  . POLYP, COLON 02/04/2008  . DYSLIPIDEMIA 02/04/2008  . Essential hypertension 02/04/2008  . Coronary artery disease 02/04/2008  . POLYPECTOMY, HX OF 02/04/2008  . CORONARY ARTERY BYPASS GRAFT, HX OF 02/04/2008    Past Surgical History:  Procedure Laterality Date  . CATARACT EXTRACTION, BILATERAL    . CORONARY ARTERY BYPASS GRAFT  1996  . INGUINAL HERNIA REPAIR Right 04/05/2017   Procedure: OPEN RIGHT INGUINAL HERNIA REPAIR;  Surgeon: Alphonsa Overall, MD;  Location: [facility];  Service: General;  Laterality: Right;  . INSERTION OF MESH Right 04/05/2017   Procedure: INSERTION OF MESH;  Surgeon: Alphonsa Overall, MD;  Location: [facility];  Service: General;  Laterality: Right;  . left digit amputation         Family History  Problem Relation Age of  Onset  . Heart disease Maternal Grandfather   . Prostate cancer Paternal Grandfather   . Heart disease Mother   . Colon cancer Neg Hx   . Esophageal cancer Neg Hx   . Stomach cancer Neg Hx   . Pancreatic cancer Neg Hx   . Liver disease Neg Hx     Social History   Tobacco Use  . Smoking status: Former Smoker    Years: 0.00    Types: Cigarettes    Quit date: 10/05/1994    Years since quitting: 25.6  . Smokeless tobacco: Never Used  Vaping Use    . Vaping Use: Never used  Substance Use Topics  . Alcohol use: No  . Drug use: No    Home Medications Prior to Admission medications   Medication Sig Start Date End Date Taking? Authorizing Provider  aspirin EC 81 MG tablet Take 81 mg by mouth daily.    [provider]  bisacodyl (DULCOLAX) 10 MG suppository Place 1 suppository (10 mg total) rectally daily. 01/20/20   Mariel Aloe, MD  brimonidine-timolol (COMBIGAN) 0.2-0.5 % ophthalmic solution Place 1 drop into the left eye every 12 (twelve) hours.    [provider]  brinzolamide (AZOPT) 1 % ophthalmic suspension Place 1 drop into the left eye 3 (three) times daily.    [provider]  latanoprost (XALATAN) 0.005 % ophthalmic solution Place 1 drop into both eyes at bedtime.    [provider]  NON FORMULARY Take 1 Can by mouth See admin instructions. Mighty Shake: Drink 1 can by mouth three times a day- 8:30 AM/11:30 AM/6 PM    [provider]  pantoprazole (PROTONIX) 40 MG tablet Take 40 mg by mouth daily before breakfast.    [provider]    Allergies    Ace inhibitors, Lisinopril, and Zocor [simvastatin]  Review of Systems   Review of Systems  Unable to perform ROS: Mental status change    Physical Exam Updated Vital Signs BP (!) 171/95 (BP Location: Right Arm)   Pulse 75   Temp 97.6 F (36.4 C) (Oral)   Resp 14   Ht 1.829 m (6')   Wt 70.4 kg   SpO2 100%   BMI 21.05 kg/m   Physical Exam Vitals and nursing note reviewed.  Constitutional:      Appearance: He is well-developed. He is ill-appearing.     Comments: Moaning   HENT:     Head: Normocephalic.     Comments: Laceration right parietal , hematoma    Right Ear: External ear normal.     Left Ear: External ear normal.  Eyes:     General: No scleral icterus.       Right eye: No discharge.        Left eye: No discharge.     Conjunctiva/sclera: Conjunctivae normal.  Neck:     Trachea: No tracheal  deviation.  Cardiovascular:     Rate and Rhythm: Normal rate and regular rhythm.  Pulmonary:     Effort: Pulmonary effort is normal. No respiratory distress.     Breath sounds: Normal breath sounds. No stridor. No wheezing or rales.  Abdominal:     General: There is distension.     Palpations: Abdomen is soft.     Tenderness: There is no guarding or rebound.     Comments: abd distended, tympanitic  Musculoskeletal:        General: No tenderness.     Cervical back: Neck supple.  Comments: abrasaion noted to toes left foot  Skin:    General: Skin is warm and dry.     Findings: No rash.  Neurological:     Cranial Nerves: No cranial nerve deficit (no facial droop,  ).     Motor: No seizure activity.     Comments: Pt non verbal, moans, does not follow commands     ED Results / Procedures / Treatments   Labs (all labs ordered are listed, but only abnormal results are displayed) Labs Reviewed  CBC - Abnormal; Notable for the following components:      Result Value   Hemoglobin 9.8 (*)    HCT 32.4 (*)    MCV 67.6 (*)    MCH 20.5 (*)    RDW 21.2 (*)    All other components within normal limits  COMPREHENSIVE METABOLIC PANEL - Abnormal; Notable for the following components:   Sodium 133 (*)    Potassium 3.1 (*)    Calcium 8.4 (*)    AST 13 (*)    All other components within normal limits  URINALYSIS, ROUTINE W REFLEX MICROSCOPIC - Abnormal; Notable for the following components:   Color, Urine STRAW (*)    All other components within normal limits  SARS CORONAVIRUS 2 BY RT PCR (HOSPITAL ORDER, [facility])  PROTIME-INR  APTT  DIFFERENTIAL  RAPID URINE DRUG SCREEN, HOSP PERFORMED  LIPASE, BLOOD    EKG EKG Interpretation  Date/Time:  Monday May 13 2020 09:05:33 EDT Ventricular Rate:  73 PR Interval:    QRS Duration: 92 QT Interval:  401 QTC Calculation: 442 R Axis:   -22 Text Interpretation: Sinus rhythm Short PR interval Probable  left atrial enlargement Inferior infarct, age indeterminate Probable anterior infarct, age indeterminate No significant change since last tracing Confirmed by Dorie Rank [phone]) on 05/13/2020 9:23:01 AM   Radiology DG Abdomen 1 View  Result Date: 05/13/2020 CLINICAL DATA:  Abdominal distention. EXAM: ABDOMEN - 1 VIEW COMPARISON:  01/19/2020.  01/17/2020 FINDINGS: Diffuse gaseous distension of bowel loops noted, very similar in appearance to the prior exams from April of this year. The distended bowel appears to mainly represent colon. Patient is status post CABG. Atherosclerotic calcification noted in the common iliac arteries bilaterally. IMPRESSION: No substantial interval change in diffuse gaseous distention of bowel, primarily colon. Imaging features are compatible with ileus/chronic dysmotility. Electronically Signed   By: Misty Stanley M.D.   On: 05/13/2020 08:33   CT HEAD WO CONTRAST  Result Date: 05/13/2020 CLINICAL DATA:  Fall from bed this morning.  Dementia. EXAM: CT HEAD WITHOUT CONTRAST CT CERVICAL SPINE WITHOUT CONTRAST TECHNIQUE: Multidetector CT imaging of the head and cervical spine was performed following the standard protocol without intravenous contrast. Multiplanar CT image reconstructions of the cervical spine were also generated. COMPARISON:  Head CT 06/11/2017 FINDINGS: CT HEAD FINDINGS Brain: Mild stable prominence of the ventricular system. Moderate chronic ischemic microvascular disease is present. Old lacunar infarct over the left basal ganglia. No mass, mass effect, shift of midline structures or acute hemorrhage. No evidence of acute infarction. Vascular: No hyperdense vessel or unexpected calcification. Skull: Normal. Negative for fracture or focal lesion. Sinuses/Orbits: No acute finding. Other: Moderate-size right parietal scalp contusion with laceration. CT CERVICAL SPINE FINDINGS Alignment: Normal. Skull base and vertebrae: Vertebral body heights are maintained. There is mild  spondylosis throughout the cervical spine. There is moderate facet arthropathy and uncovertebral joint spurring. Fusion of the left C2-3 facets. Right-sided neural foraminal  narrowing at the C4-5 level. Mild right-sided neural foraminal narrowing at the C5-6 level. No acute fracture. Soft tissues and spinal canal: Mild canal stenosis at the C2-3 level and C3-4 level mostly due to short pedicles. Soft tissues are unremarkable. Disc levels: Disc space narrowing at the C2-3 level and C4-5 level. Minimal disc space narrowing at the C6-7 level. Upper chest: No acute findings. Other: 2 cm hypodense nodule over the right thyroid. IMPRESSION: 1. No acute brain injury. Moderate size right parietal scalp contusion/laceration. 2. Mild atrophy and moderate chronic ischemic microvascular disease. 3. Mild spondylosis throughout the cervical spine with multilevel disc disease and mild multilevel neural foraminal narrowing as described. Mild canal stenosis at the C2-3 and C3-4 levels. 4. 2 cm right thyroid nodule. Recommend thyroid US. (Ref: J Am Coll Radiol. 2015 Feb;12(2): 143-50). Electronically Signed   By: Marin Olp M.D.   On: 05/13/2020 09:11   CT CERVICAL SPINE WO CONTRAST  Result Date: 05/13/2020 CLINICAL DATA:  Fall from bed this morning.  Dementia. EXAM: CT HEAD WITHOUT CONTRAST CT CERVICAL SPINE WITHOUT CONTRAST TECHNIQUE: Multidetector CT imaging of the head and cervical spine was performed following the standard protocol without intravenous contrast. Multiplanar CT image reconstructions of the cervical spine were also generated. COMPARISON:  Head CT 06/11/2017 FINDINGS: CT HEAD FINDINGS Brain: Mild stable prominence of the ventricular system. Moderate chronic ischemic microvascular disease is present. Old lacunar infarct over the left basal ganglia. No mass, mass effect, shift of midline structures or acute hemorrhage. No evidence of acute infarction. Vascular: No hyperdense vessel or unexpected calcification.  Skull: Normal. Negative for fracture or focal lesion. Sinuses/Orbits: No acute finding. Other: Moderate-size right parietal scalp contusion with laceration. CT CERVICAL SPINE FINDINGS Alignment: Normal. Skull base and vertebrae: Vertebral body heights are maintained. There is mild spondylosis throughout the cervical spine. There is moderate facet arthropathy and uncovertebral joint spurring. Fusion of the left C2-3 facets. Right-sided neural foraminal narrowing at the C4-5 level. Mild right-sided neural foraminal narrowing at the C5-6 level. No acute fracture. Soft tissues and spinal canal: Mild canal stenosis at the C2-3 level and C3-4 level mostly due to short pedicles. Soft tissues are unremarkable. Disc levels: Disc space narrowing at the C2-3 level and C4-5 level. Minimal disc space narrowing at the C6-7 level. Upper chest: No acute findings. Other: 2 cm hypodense nodule over the right thyroid. IMPRESSION: 1. No acute brain injury. Moderate size right parietal scalp contusion/laceration. 2. Mild atrophy and moderate chronic ischemic microvascular disease. 3. Mild spondylosis throughout the cervical spine with multilevel disc disease and mild multilevel neural foraminal narrowing as described. Mild canal stenosis at the C2-3 and C3-4 levels. 4. 2 cm right thyroid nodule. Recommend thyroid US. (Ref: J Am Coll Radiol. 2015 Feb;12(2): 143-50). Electronically Signed   By: Marin Olp M.D.   On: 05/13/2020 09:11   DG Chest Portable 1 View  Result Date: 05/13/2020 CLINICAL DATA:  Abdominal distension.  Fall today. EXAM: PORTABLE CHEST 1 VIEW COMPARISON:  04/24/2019 FINDINGS: Lungs are hypoinflated and otherwise clear. Cardiomediastinal silhouette is normal. Sternotomy wires are unchanged. No acute fracture. IMPRESSION: No acute findings. Electronically Signed   By: Marin Olp M.D.   On: 05/13/2020 08:35    Procedures .Marland KitchenLaceration Repair  Date/Time: 05/13/2020 8:09 AM Performed by: Dorie Rank,  MD Authorized by: Dorie Rank, MD   Consent:    Consent obtained:  Emergent situation Universal protocol:    Procedure explained and questions answered to patient or proxy's satisfaction: no  Relevant documents present and verified: yes     Test results available and properly labeled: yes     Imaging studies available: yes     Required blood products, implants, devices, and special equipment available: yes     Site/side marked: yes     Immediately prior to procedure, a time out was called: yes     Patient identity confirmed:  Hospital-assigned identification number Anesthesia (see MAR for exact dosages):    Anesthesia method:  Local infiltration   Local anesthetic:  Lidocaine 1% WITH epi Laceration details:    Location:  Scalp   Length (cm):  2 Repair type:    Repair type:  Simple Pre-procedure details:    Preparation:  Patient was prepped and draped in usual sterile fashion Exploration:    Wound extent: no foreign bodies/material noted, no underlying fracture noted and no vascular damage noted     Contaminated: no   Treatment:    Area cleansed with:  Saline   Amount of cleaning:  Standard Skin repair:    Repair method:  Staples   Number of staples:  5 Approximation:    Approximation:  Close Post-procedure details:    Dressing:  Open (no dressing)   (including critical care time)  Medications Ordered in ED Medications  sodium chloride 0.9 % bolus 500 mL (0 mLs Intravenous Stopped 05/13/20 0841)    Followed by  0.9 %  sodium chloride infusion (has no administration in time range)  lidocaine-EPINEPHrine (XYLOCAINE W/EPI) 2 %-1:200000 (PF) injection 10 mL (10 mLs Infiltration Given 05/13/20 0930)    ED Course  I have reviewed the triage vital signs and the nursing notes.  Pertinent labs & imaging results that were available during my care of the patient were reviewed by me and considered in my medical decision making (see chart for details).  Clinical Course as of May 13 1356  Mon May 13, 2020  0723 Tetanus given 01/2016 noted in records    [JK]  0726 DNR document noted   [JK]  1027 Labs reviewed.  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  Chronic abd distension is noted.  They recently saw him at the nursing facility end of July and he had the same findings.   [JK]    Clinical Course User Index [JK] Dorie Rank, MD   MDM Rules/Calculators/A&P                         Patient's ED work-up does not show any signs of serious injury.  Head CT and C-spine CT without acute injuries.  Patient's blood pressure was initially elevated but that is improved somewhat.  Most recent blood pressure is 171/95.  Laboratory test did not show any acute abnormalities.  Anemia stable.  No signs of acute urinary tract infection.  Electrolytes are unremarkable.  Patient was noted to have abdominal distention.  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  X-ray findings are unchanged from baseline.  I did discuss his case with [facility].  They are aware of his chronic abdominal distention and are managing it.  Patient otherwise appears stable for discharge at this time.  He will require staple removal in a week.  Incidental thyroid nodule noted.   Follow up with PCP to discuss whether or not further evaluation is necessary Final Clinical Impression(s) / ED Diagnoses Final diagnoses:  Thyroid nodule  Laceration of scalp, initial encounter  Ileus [facility])    Rx / DC Orders ED Discharge Orders    None       Dorie Rank, MD 05/13/20 1357

## 2020-05-13 NOTE — ED Notes (Signed)
Transporters from [facility] arrived to transport Pt after DC and cancelling [facility]. Pt non ambulatory and was lifted into wheelchair by [facility] staff against advise of this Probation officer.

## 2020-10-12 ENCOUNTER — Emergency Department (HOSPITAL_COMMUNITY)
Admission: EM | Admit: 2020-10-12 | Discharge: 2020-10-13 | Disposition: A | Discharge status: Home / Self Care | Payer: Medicare (Managed Care) | Attending: Emergency Medicine | Admitting: Emergency Medicine

## 2020-10-12 ENCOUNTER — Encounter (HOSPITAL_COMMUNITY): Payer: Self-pay | Admitting: Emergency Medicine

## 2020-10-12 ENCOUNTER — Other Ambulatory Visit: Payer: Self-pay

## 2020-10-12 ENCOUNTER — Emergency Department (HOSPITAL_COMMUNITY): Payer: Medicare (Managed Care)

## 2020-10-12 DIAGNOSIS — I2510 Atherosclerotic heart disease of native coronary artery without angina pectoris: Secondary | ICD-10-CM | POA: Diagnosis not present

## 2020-10-12 DIAGNOSIS — Z87891 Personal history of nicotine dependence: Secondary | ICD-10-CM | POA: Diagnosis not present

## 2020-10-12 DIAGNOSIS — Z7982 Long term (current) use of aspirin: Secondary | ICD-10-CM | POA: Diagnosis not present

## 2020-10-12 DIAGNOSIS — F0280 Dementia in other diseases classified elsewhere without behavioral disturbance: Secondary | ICD-10-CM | POA: Insufficient documentation

## 2020-10-12 DIAGNOSIS — E860 Dehydration: Secondary | ICD-10-CM | POA: Diagnosis not present

## 2020-10-12 DIAGNOSIS — G301 Alzheimer's disease with late onset: Secondary | ICD-10-CM | POA: Diagnosis not present

## 2020-10-12 DIAGNOSIS — U071 COVID-19: Secondary | ICD-10-CM | POA: Insufficient documentation

## 2020-10-12 DIAGNOSIS — R509 Fever, unspecified: Secondary | ICD-10-CM | POA: Diagnosis present

## 2020-10-12 DIAGNOSIS — I10 Essential (primary) hypertension: Secondary | ICD-10-CM | POA: Diagnosis not present

## 2020-10-12 DIAGNOSIS — Z951 Presence of aortocoronary bypass graft: Secondary | ICD-10-CM | POA: Diagnosis not present

## 2020-10-12 LAB — FIBRINOGEN: Fibrinogen: 563 mg/dL — ABNORMAL HIGH (ref 210–475)

## 2020-10-12 LAB — CBC WITH DIFFERENTIAL/PLATELET
Abs Immature Granulocytes: 0.04 10*3/uL (ref 0.00–0.07)
Basophils Absolute: 0 10*3/uL (ref 0.0–0.1)
Basophils Relative: 1 %
Eosinophils Absolute: 0.1 10*3/uL (ref 0.0–0.5)
Eosinophils Relative: 1 %
HCT: 39.1 % (ref 39.0–52.0)
Hemoglobin: 11 g/dL — ABNORMAL LOW (ref 13.0–17.0)
Immature Granulocytes: 1 %
Lymphocytes Relative: 19 %
Lymphs Abs: 1.3 10*3/uL (ref 0.7–4.0)
MCH: 20 pg — ABNORMAL LOW (ref 26.0–34.0)
MCHC: 28.1 g/dL — ABNORMAL LOW (ref 30.0–36.0)
MCV: 71.1 fL — ABNORMAL LOW (ref 80.0–100.0)
Monocytes Absolute: 0.8 10*3/uL (ref 0.1–1.0)
Monocytes Relative: 11 %
Neutro Abs: 4.9 10*3/uL (ref 1.7–7.7)
Neutrophils Relative %: 67 %
Platelets: 287 10*3/uL (ref 150–400)
RBC: 5.5 MIL/uL (ref 4.22–5.81)
RDW: 23.7 % — ABNORMAL HIGH (ref 11.5–15.5)
WBC: 7.2 10*3/uL (ref 4.0–10.5)
nRBC: 0 % (ref 0.0–0.2)

## 2020-10-12 LAB — LACTIC ACID, PLASMA
Lactic Acid, Venous: 1.4 mmol/L (ref 0.5–1.9)
Lactic Acid, Venous: 1.8 mmol/L (ref 0.5–1.9)

## 2020-10-12 LAB — PROCALCITONIN: Procalcitonin: <0.1 ng/mL

## 2020-10-12 LAB — FERRITIN: Ferritin: 65 ng/mL (ref 24–336)

## 2020-10-12 LAB — RESP PANEL BY RT-PCR (FLU A&B, COVID) ARPGX2
Influenza A by PCR: NEGATIVE
Influenza B by PCR: NEGATIVE
SARS Coronavirus 2 by RT PCR: POSITIVE — AB

## 2020-10-12 LAB — COMPREHENSIVE METABOLIC PANEL
ALT: 42 U/L (ref 0–44)
AST: 40 U/L (ref 15–41)
Albumin: 3.4 g/dL — ABNORMAL LOW (ref 3.5–5.0)
Alkaline Phosphatase: 70 U/L (ref 38–126)
Anion gap: 12 (ref 5–15)
BUN: 22 mg/dL (ref 8–23)
CO2: 20 mmol/L — ABNORMAL LOW (ref 22–32)
Calcium: 9.2 mg/dL (ref 8.9–10.3)
Chloride: 123 mmol/L — ABNORMAL HIGH (ref 98–111)
Creatinine, Ser: 0.89 mg/dL (ref 0.61–1.24)
GFR, Estimated: >60 mL/min (ref >60)
Glucose, Bld: 105 mg/dL — ABNORMAL HIGH (ref 70–99)
Potassium: 3.6 mmol/L (ref 3.5–5.1)
Sodium: 155 mmol/L — ABNORMAL HIGH (ref 135–145)
Total Bilirubin: 0.6 mg/dL (ref 0.3–1.2)
Total Protein: 7.6 g/dL (ref 6.5–8.1)

## 2020-10-12 LAB — LACTATE DEHYDROGENASE: LDH: 178 U/L (ref 98–192)

## 2020-10-12 LAB — TRIGLYCERIDES: Triglycerides: 65 mg/dL (ref <150)

## 2020-10-12 LAB — D-DIMER, QUANTITATIVE: D-Dimer, Quant: 1.05 ug/mL-FEU — ABNORMAL HIGH (ref 0.00–0.50)

## 2020-10-12 LAB — C-REACTIVE PROTEIN: CRP: 4.5 mg/dL — ABNORMAL HIGH (ref <1.0)

## 2020-10-12 NOTE — ED Notes (Signed)
[facility] called for transport back to [facility] .  

## 2020-10-12 NOTE — ED Triage Notes (Signed)
Patient BIBA from [facility], daughter visiting with patient and requested he be seen d/t low O2 saturation. EMS states he was 98% on room air on arrival. Staff reports patient had normal oxygen saturation despite what daughter said. Patient is nonverbal and does not follow commands at baseline.   BP 144/82 HR 86 98% RA RR 12 T 98.2

## 2020-10-12 NOTE — ED Provider Notes (Signed)
[facility] DEPT Provider Note   CSN: [id number] Arrival date & time: 10/12/20  1710     History Chief Complaint  Patient presents with   Covid Positive    DAMONEY JULIA is a 80 y.o. male.  HPI Presents from nursing facility via EMS.  History is obtained by those individuals and on chart review as the patient is nonverbal, has a history of dementia, level 5 caveat.  Is a The patient is COVID-positive.  Per EMS the patient's family was visiting, found him to be hypoxic.  Nursing home staff note the patient had no hypoxia, do not report fever or other changes from baseline. The patient self cannot provide any details of his history.  EMS vital signs blood pressure 140/80 Heart rate 86 98% saturation on room air Afebrile      Past Medical History:  Diagnosis Date   Alzheimer's disease ([place])    CAD (coronary artery disease)    Catheterization 2006 LAD occluded, OM 95% stenosis followed by mid occlusion, first obtuse marginal occluded, right coronary artery diffuse moderate disease. LIMA to the LAD was patent. Saphenous vein to PDA and posterior lateral patent with diffuse luminal irregularities, saphenous vein graft to second obtuse marginal is patent, saphenous vein graft to the first obtuse marginal had 25% stenosis.   Esophageal reflux    Esophageal stricture    Other and unspecified hyperlipidemia    Other dysphagia    Rheumatoid arthritis(714.0)    Status post dilation of esophageal narrowing    Unspecified essential hypertension     Patient Active Problem List   Diagnosis Date Noted   Ogilvie syndrome    Ileus ([place]) 01/14/2020   Adynamic ileus ([place]) 03/21/2019   Dementia without behavioral disturbance ([place])    Goals of care, counseling/discussion    Palliative care by specialist    Pressure injury of skin 03/20/2019   Abdominal distention    Sepsis ([place]) 03/19/2019   Acute hypernatremia 03/19/2019    Hypokalemia 03/19/2019   Anemia 03/19/2019   Dehydration    Delirium 06/11/2017   Acute kidney injury ([place]) 06/11/2017   Fever 06/11/2017   Effusion of right knee 06/11/2017   Inguinal hernia 12/24/2016   Testicular pain, left 08/07/2016   Hemorrhoid 08/07/2016   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[place]) 07/24/2016   Routine general medical examination at a health care facility 01/27/2016   Medicare annual wellness visit, subsequent 01/27/2016   Essential hypertension, benign 08/08/2009   GERD 07/04/2008   RHEUMATOID ARTHRITIS 07/04/2008   WEIGHT LOSS-ABNORMAL 07/04/2008   DYSPHAGIA 07/04/2008   TOBACCO USER 07/03/2008   POLYP, COLON 02/04/2008   DYSLIPIDEMIA 02/04/2008   Essential hypertension 02/04/2008   Coronary artery disease 02/04/2008   POLYPECTOMY, HX OF 02/04/2008   CORONARY ARTERY BYPASS GRAFT, HX OF 02/04/2008    Past Surgical History:  Procedure Laterality Date   CATARACT EXTRACTION, BILATERAL     CORONARY ARTERY BYPASS GRAFT  1996   INGUINAL HERNIA REPAIR Right 04/05/2017   Procedure: OPEN RIGHT INGUINAL HERNIA REPAIR;  Surgeon: Alphonsa Overall, MD;  Location: [facility];  Service: General;  Laterality: Right;   INSERTION OF MESH Right 04/05/2017   Procedure: INSERTION OF MESH;  Surgeon: Alphonsa Overall, MD;  Location: [facility];  Service: General;  Laterality: Right;   left digit amputation         Family History  Problem Relation Age of Onset   Heart disease Maternal Grandfather    Prostate cancer  Paternal Grandfather    Heart disease Mother    Colon cancer Neg Hx    Esophageal cancer Neg Hx    Stomach cancer Neg Hx    Pancreatic cancer Neg Hx    Liver disease Neg Hx     Social History   Tobacco Use   Smoking status: Former Smoker    Years: 0.00    Types: Cigarettes    Quit date: 10/05/1994    Years since quitting: 26.0   Smokeless tobacco: Never Used  Vaping Use   Vaping Use: Never used   Substance Use Topics   Alcohol use: No   Drug use: No    Home Medications Prior to Admission medications   Medication Sig Start Date End Date Taking? Authorizing Provider  aspirin EC 81 MG tablet Take 81 mg by mouth daily.    [provider]  bisacodyl (DULCOLAX) 10 MG suppository Place 1 suppository (10 mg total) rectally daily. 01/20/20   Mariel Aloe, MD  brimonidine-timolol (COMBIGAN) 0.2-0.5 % ophthalmic solution Place 1 drop into the left eye every 12 (twelve) hours.    [provider]  brinzolamide (AZOPT) 1 % ophthalmic suspension Place 1 drop into the left eye 3 (three) times daily.    [provider]  latanoprost (XALATAN) 0.005 % ophthalmic solution Place 1 drop into both eyes at bedtime.    [provider]  NON FORMULARY Take 1 Can by mouth See admin instructions. Mighty Shake: Drink 1 can by mouth three times a day- 8:30 AM/11:30 AM/6 PM    [provider]  pantoprazole (PROTONIX) 40 MG tablet Take 40 mg by mouth daily before breakfast.    [provider]    Allergies    Ace inhibitors, Lisinopril, and Zocor [simvastatin]  Review of Systems   Review of Systems  Unable to perform ROS: Dementia    Physical Exam Updated Vital Signs BP (!) 163/120    Pulse 87    Resp 18    SpO2 99%   Physical Exam Vitals and nursing note reviewed.  Constitutional:      General: He is not in acute distress.    Appearance: He is well-developed. He is not toxic-appearing.     Comments: Withdrawn, nonverbal adult male resting in supine position, no obvious distress.  HENT:     Head: Normocephalic and atraumatic.  Eyes:     Extraocular Movements: EOM normal.     Conjunctiva/sclera: Conjunctivae normal.  Cardiovascular:     Rate and Rhythm: Normal rate and regular rhythm.  Pulmonary:     Effort: Pulmonary effort is normal. No respiratory distress.     Breath sounds: No stridor. Decreased breath sounds present.  Abdominal:      General: There is no distension.  Musculoskeletal:        General: No edema.  Skin:    General: Skin is warm and dry.  Neurological:     Comments: Patient is nonverbal, does not follow commands, does respond to painful stimuli does move his extremities spontaneously.  Psychiatric:        Mood and Affect: Mood and affect normal.        Cognition and Memory: Cognition is impaired. Memory is impaired.     Comments: Noninteractive      ED Results / Procedures / Treatments   Labs (all labs ordered are listed, but only abnormal results are displayed) Labs Reviewed  RESP PANEL BY RT-PCR (FLU A&B, COVID) ARPGX2 - Abnormal; Notable for  the following components:      Result Value   SARS Coronavirus 2 by RT PCR POSITIVE (*)    All other components within normal limits  CBC WITH DIFFERENTIAL/PLATELET - Abnormal; Notable for the following components:   Hemoglobin 11.0 (*)    MCV 71.1 (*)    MCH 20.0 (*)    MCHC 28.1 (*)    RDW 23.7 (*)    All other components within normal limits  COMPREHENSIVE METABOLIC PANEL - Abnormal; Notable for the following components:   Sodium 155 (*)    Chloride 123 (*)    CO2 20 (*)    Glucose, Bld 105 (*)    Albumin 3.4 (*)    All other components within normal limits  D-DIMER, QUANTITATIVE (NOT AT [facility]) - Abnormal; Notable for the following components:   D-Dimer, Quant 1.05 (*)    All other components within normal limits  FIBRINOGEN - Abnormal; Notable for the following components:   Fibrinogen 563 (*)    All other components within normal limits  C-REACTIVE PROTEIN - Abnormal; Notable for the following components:   CRP 4.5 (*)    All other components within normal limits  CULTURE, BLOOD (ROUTINE X 2)  CULTURE, BLOOD (ROUTINE X 2)  LACTIC ACID, PLASMA  LACTIC ACID, PLASMA  PROCALCITONIN  LACTATE DEHYDROGENASE  FERRITIN  TRIGLYCERIDES    EKG EKG Interpretation  Date/Time:  Saturday October 12 2020 18:58:08 EST Ventricular Rate:  84 PR  Interval:    QRS Duration: 108 QT Interval:  392 QTC Calculation: 464 R Axis:   16 Text Interpretation: Sinus rhythm Poor data quality T wave abnormality Abnormal ECG Confirmed by Carmin Muskrat [phone]) on 10/12/2020 7:06:30 PM   Radiology DG Chest Port 1 View  Result Date: 10/12/2020 CLINICAL DATA:  Hypoxia. EXAM: PORTABLE CHEST 1 VIEW COMPARISON:  May 13, 2020 FINDINGS: No pneumothorax. The heart, hila, mediastinum are normal. No pulmonary nodules or masses. No focal infiltrates. Air-filled dilated loops of colon are seen under both the right left hemidiaphragm, unchanged. IMPRESSION: 1. No active disease. 2. Dilated loops of colon remain in the upper abdomen. Electronically Signed   By: Dorise Bullion III M.D   On: 10/12/2020 18:13    Procedures Procedures (including critical care time)  Medications Ordered in ED Medications - No data to display  ED Course  I have reviewed the triage vital signs and the nursing notes.  Pertinent labs & imaging results that were available during my care of the patient were reviewed by me and considered in my medical decision making (see chart for details).     Elderly male presents to nursing facility with family concern of hypoxia in the context of new COVID infection.  Patient is initially in no distress, but given his history of dementia, nonverbal status, labs, x-ray ordered.  Update: Initial findings reassuring, no lactic acidosis, no substantial abnormalities aside from mild hypernatremia.  X-ray consistent with multiple prior with gaseous distention of his abdomen.  On exam the patient had no guarding, no rebound, did not exhibit pain with palpation.  Update: I discussed the patient's case with 2 of his daughters.  We discussed his known COVID infection, concern for episode of hypoxia at the nursing facility, absence of this here.  Patient does have COVID, but has no evidence for decompensated state, no hemodynamic instability, no appreciable  discomfort on exam.  We discussed options for therapy, and family seemed comfortable with initiation of monoclonal antibodies, to which the patient  was referred, on discharge back to his nursing facility.  MDM Rules/Calculators/A&P MDM Number of Diagnoses or Management Options COVID-19 virus infection: new, needed workup   Amount and/or Complexity of Data Reviewed Clinical lab tests: reviewed Tests in the radiology section of CPT: reviewed Tests in the medicine section of CPT: reviewed Decide to obtain previous medical records or to obtain history from someone other than the patient: yes Obtain history from someone other than the patient: yes Review and summarize past medical records: yes Independent visualization of images, tracings, or specimens: yes  Risk of Complications, Morbidity, and/or Mortality Presenting problems: high Diagnostic procedures: high Management options: high  Critical Care Total time providing critical care: < 30 minutes  Patient Progress Patient progress: stable  Final Clinical Impression(s) / ED Diagnoses Final diagnoses:  COVID-19 virus infection     Carmin Muskrat, MD 10/13/20 1328

## 2020-10-12 NOTE — Discharge Instructions (Addendum)
As discussed, your father has been diagnosed with coronavirus.  The recovery from this disease can take several days or weeks.  Please monitor his condition carefully and do not hesitate to return here for concerning changes.  He has been referred to our monoclonal antibody infusion clinic.  They should contact you in the coming days for enrollment and return for this therapy.

## 2020-10-15 ENCOUNTER — Inpatient Hospital Stay (HOSPITAL_COMMUNITY): Payer: Medicare (Managed Care)

## 2020-10-15 ENCOUNTER — Other Ambulatory Visit: Payer: Self-pay

## 2020-10-15 ENCOUNTER — Inpatient Hospital Stay (HOSPITAL_COMMUNITY)
Admission: EM | Admit: 2020-10-15 | Discharge: 2020-10-18 | DRG: 177 | Disposition: A | Discharge status: Skilled Nursing Facility | Payer: Medicare (Managed Care) | Source: Skilled Nursing Facility | Attending: Family Medicine | Admitting: Family Medicine

## 2020-10-15 ENCOUNTER — Emergency Department (HOSPITAL_COMMUNITY): Payer: Medicare (Managed Care)

## 2020-10-15 ENCOUNTER — Encounter (HOSPITAL_COMMUNITY): Payer: Self-pay

## 2020-10-15 DIAGNOSIS — K567 Ileus, unspecified: Secondary | ICD-10-CM | POA: Diagnosis present

## 2020-10-15 DIAGNOSIS — J189 Pneumonia, unspecified organism: Secondary | ICD-10-CM | POA: Diagnosis present

## 2020-10-15 DIAGNOSIS — E872 Acidosis: Secondary | ICD-10-CM | POA: Diagnosis present

## 2020-10-15 DIAGNOSIS — R1319 Other dysphagia: Secondary | ICD-10-CM | POA: Diagnosis present

## 2020-10-15 DIAGNOSIS — N179 Acute kidney failure, unspecified: Secondary | ICD-10-CM | POA: Diagnosis present

## 2020-10-15 DIAGNOSIS — K5939 Other megacolon: Secondary | ICD-10-CM | POA: Diagnosis present

## 2020-10-15 DIAGNOSIS — Z66 Do not resuscitate: Secondary | ICD-10-CM | POA: Diagnosis present

## 2020-10-15 DIAGNOSIS — E878 Other disorders of electrolyte and fluid balance, not elsewhere classified: Secondary | ICD-10-CM | POA: Diagnosis present

## 2020-10-15 DIAGNOSIS — Z79899 Other long term (current) drug therapy: Secondary | ICD-10-CM

## 2020-10-15 DIAGNOSIS — U071 COVID-19: Secondary | ICD-10-CM | POA: Diagnosis present

## 2020-10-15 DIAGNOSIS — J9601 Acute respiratory failure with hypoxia: Secondary | ICD-10-CM | POA: Diagnosis present

## 2020-10-15 DIAGNOSIS — J1282 Pneumonia due to coronavirus disease 2019: Secondary | ICD-10-CM | POA: Diagnosis present

## 2020-10-15 DIAGNOSIS — G301 Alzheimer's disease with late onset: Secondary | ICD-10-CM | POA: Diagnosis present

## 2020-10-15 DIAGNOSIS — E870 Hyperosmolality and hypernatremia: Secondary | ICD-10-CM | POA: Diagnosis present

## 2020-10-15 DIAGNOSIS — K219 Gastro-esophageal reflux disease without esophagitis: Secondary | ICD-10-CM | POA: Diagnosis present

## 2020-10-15 DIAGNOSIS — E860 Dehydration: Secondary | ICD-10-CM | POA: Diagnosis present

## 2020-10-15 DIAGNOSIS — E785 Hyperlipidemia, unspecified: Secondary | ICD-10-CM | POA: Diagnosis present

## 2020-10-15 DIAGNOSIS — Z8249 Family history of ischemic heart disease and other diseases of the circulatory system: Secondary | ICD-10-CM

## 2020-10-15 DIAGNOSIS — Z681 Body mass index (BMI) 19 or less, adult: Secondary | ICD-10-CM | POA: Diagnosis not present

## 2020-10-15 DIAGNOSIS — F0280 Dementia in other diseases classified elsewhere without behavioral disturbance: Secondary | ICD-10-CM | POA: Diagnosis present

## 2020-10-15 DIAGNOSIS — R64 Cachexia: Secondary | ICD-10-CM | POA: Diagnosis present

## 2020-10-15 DIAGNOSIS — K560 Paralytic ileus: Secondary | ICD-10-CM

## 2020-10-15 DIAGNOSIS — R1310 Dysphagia, unspecified: Secondary | ICD-10-CM | POA: Diagnosis present

## 2020-10-15 DIAGNOSIS — Z87891 Personal history of nicotine dependence: Secondary | ICD-10-CM

## 2020-10-15 DIAGNOSIS — I2699 Other pulmonary embolism without acute cor pulmonale: Secondary | ICD-10-CM | POA: Diagnosis present

## 2020-10-15 DIAGNOSIS — I10 Essential (primary) hypertension: Secondary | ICD-10-CM | POA: Diagnosis present

## 2020-10-15 DIAGNOSIS — Z9109 Other allergy status, other than to drugs and biological substances: Secondary | ICD-10-CM

## 2020-10-15 DIAGNOSIS — I2510 Atherosclerotic heart disease of native coronary artery without angina pectoris: Secondary | ICD-10-CM | POA: Diagnosis present

## 2020-10-15 DIAGNOSIS — Z951 Presence of aortocoronary bypass graft: Secondary | ICD-10-CM

## 2020-10-15 DIAGNOSIS — M069 Rheumatoid arthritis, unspecified: Secondary | ICD-10-CM | POA: Diagnosis present

## 2020-10-15 DIAGNOSIS — Z7982 Long term (current) use of aspirin: Secondary | ICD-10-CM

## 2020-10-15 LAB — URINALYSIS, ROUTINE W REFLEX MICROSCOPIC
Bilirubin Urine: NEGATIVE
Glucose, UA: NEGATIVE mg/dL
Hgb urine dipstick: NEGATIVE
Ketones, ur: NEGATIVE mg/dL
Leukocytes,Ua: NEGATIVE
Nitrite: NEGATIVE
Protein, ur: 100 mg/dL — AB
Specific Gravity, Urine: 1.028 (ref 1.005–1.030)
pH: 5 (ref 5.0–8.0)

## 2020-10-15 LAB — MAGNESIUM: Magnesium: 2.7 mg/dL — ABNORMAL HIGH (ref 1.7–2.4)

## 2020-10-15 LAB — COMPREHENSIVE METABOLIC PANEL
ALT: 23 U/L (ref 0–44)
AST: 20 U/L (ref 15–41)
Albumin: 3.4 g/dL — ABNORMAL LOW (ref 3.5–5.0)
Alkaline Phosphatase: 74 U/L (ref 38–126)
Anion gap: 15 (ref 5–15)
BUN: 50 mg/dL — ABNORMAL HIGH (ref 8–23)
CO2: 19 mmol/L — ABNORMAL LOW (ref 22–32)
Calcium: 9 mg/dL (ref 8.9–10.3)
Chloride: 127 mmol/L — ABNORMAL HIGH (ref 98–111)
Creatinine, Ser: 1.77 mg/dL — ABNORMAL HIGH (ref 0.61–1.24)
GFR, Estimated: 39 mL/min — ABNORMAL LOW (ref >60)
Glucose, Bld: 147 mg/dL — ABNORMAL HIGH (ref 70–99)
Potassium: 3.2 mmol/L — ABNORMAL LOW (ref 3.5–5.1)
Sodium: 161 mmol/L (ref 135–145)
Total Bilirubin: 1.5 mg/dL — ABNORMAL HIGH (ref 0.3–1.2)
Total Protein: 8.4 g/dL — ABNORMAL HIGH (ref 6.5–8.1)

## 2020-10-15 LAB — CBC WITH DIFFERENTIAL/PLATELET
Abs Immature Granulocytes: 0.08 10*3/uL — ABNORMAL HIGH (ref 0.00–0.07)
Basophils Absolute: 0 10*3/uL (ref 0.0–0.1)
Basophils Relative: 0 %
Eosinophils Absolute: 0.1 10*3/uL (ref 0.0–0.5)
Eosinophils Relative: 0 %
HCT: 47.5 % (ref 39.0–52.0)
Hemoglobin: 13.5 g/dL (ref 13.0–17.0)
Immature Granulocytes: 1 %
Lymphocytes Relative: 6 %
Lymphs Abs: 0.8 10*3/uL (ref 0.7–4.0)
MCH: 20.5 pg — ABNORMAL LOW (ref 26.0–34.0)
MCHC: 28.4 g/dL — ABNORMAL LOW (ref 30.0–36.0)
MCV: 72 fL — ABNORMAL LOW (ref 80.0–100.0)
Monocytes Absolute: 0.8 10*3/uL (ref 0.1–1.0)
Monocytes Relative: 6 %
Neutro Abs: 11.9 10*3/uL — ABNORMAL HIGH (ref 1.7–7.7)
Neutrophils Relative %: 87 %
Platelets: 427 10*3/uL — ABNORMAL HIGH (ref 150–400)
RBC: 6.6 MIL/uL — ABNORMAL HIGH (ref 4.22–5.81)
RDW: 24 % — ABNORMAL HIGH (ref 11.5–15.5)
WBC: 13.7 10*3/uL — ABNORMAL HIGH (ref 4.0–10.5)
nRBC: 0.6 % — ABNORMAL HIGH (ref 0.0–0.2)

## 2020-10-15 LAB — CREATININE, SERUM
Creatinine, Ser: 1.66 mg/dL — ABNORMAL HIGH (ref 0.61–1.24)
GFR, Estimated: 42 mL/min — ABNORMAL LOW (ref >60)

## 2020-10-15 LAB — CBC
HCT: 40.4 % (ref 39.0–52.0)
Hemoglobin: 11.8 g/dL — ABNORMAL LOW (ref 13.0–17.0)
MCH: 20.7 pg — ABNORMAL LOW (ref 26.0–34.0)
MCHC: 29.2 g/dL — ABNORMAL LOW (ref 30.0–36.0)
MCV: 70.8 fL — ABNORMAL LOW (ref 80.0–100.0)
Platelets: 296 10*3/uL (ref 150–400)
RBC: 5.71 MIL/uL (ref 4.22–5.81)
RDW: 23.4 % — ABNORMAL HIGH (ref 11.5–15.5)
WBC: 15 10*3/uL — ABNORMAL HIGH (ref 4.0–10.5)
nRBC: 0.3 % — ABNORMAL HIGH (ref 0.0–0.2)

## 2020-10-15 LAB — LACTIC ACID, PLASMA
Lactic Acid, Venous: 4.2 mmol/L (ref 0.5–1.9)
Lactic Acid, Venous: 5.2 mmol/L (ref 0.5–1.9)

## 2020-10-15 LAB — FIBRINOGEN: Fibrinogen: 720 mg/dL — ABNORMAL HIGH (ref 210–475)

## 2020-10-15 LAB — C-REACTIVE PROTEIN: CRP: 19.5 mg/dL — ABNORMAL HIGH (ref <1.0)

## 2020-10-15 LAB — PROCALCITONIN: Procalcitonin: 1.27 ng/mL

## 2020-10-15 LAB — TRIGLYCERIDES: Triglycerides: 106 mg/dL (ref <150)

## 2020-10-15 LAB — D-DIMER, QUANTITATIVE: D-Dimer, Quant: 2.11 ug/mL-FEU — ABNORMAL HIGH (ref 0.00–0.50)

## 2020-10-15 LAB — LACTATE DEHYDROGENASE: LDH: 177 U/L (ref 98–192)

## 2020-10-15 LAB — FERRITIN: Ferritin: 195 ng/mL (ref 24–336)

## 2020-10-15 LAB — MRSA PCR SCREENING: MRSA by PCR: NEGATIVE

## 2020-10-15 MED ORDER — GUAIFENESIN-DM 100-10 MG/5ML PO SYRP: 10.0000 mL | ORAL_SOLUTION | ORAL | Status: DC | PRN

## 2020-10-15 MED ORDER — SODIUM CHLORIDE 0.9 % IV SOLN
2.0000 g | Freq: Two times a day (BID) | INTRAVENOUS | Status: DC
  Administered 2020-10-15: 2 g via INTRAVENOUS
  Filled 2020-10-15 (×2): qty 2

## 2020-10-15 MED ORDER — TECHNETIUM TO 99M ALBUMIN AGGREGATED
4.4000 | Freq: Once | INTRAVENOUS | Status: AC
  Administered 2020-10-15: 4.4 via INTRAVENOUS

## 2020-10-15 MED ORDER — PREDNISONE 50 MG PO TABS: 50.0000 mg | ORAL_TABLET | Freq: Every day | ORAL | Status: DC

## 2020-10-15 MED ORDER — VANCOMYCIN HCL IN DEXTROSE 1-5 GM/200ML-% IV SOLN
1000.0000 mg | Freq: Once | INTRAVENOUS | Status: AC
  Administered 2020-10-15: 1000 mg via INTRAVENOUS
  Filled 2020-10-15: qty 200

## 2020-10-15 MED ORDER — ASCORBIC ACID 500 MG PO TABS
500.0000 mg | ORAL_TABLET | Freq: Every day | ORAL | Status: DC
  Administered 2020-10-17 – 2020-10-18 (×2): 500 mg via ORAL
  Filled 2020-10-15 (×3): qty 1

## 2020-10-15 MED ORDER — ACETAMINOPHEN 650 MG RE SUPP: 650.0000 mg | Freq: Four times a day (QID) | RECTAL | Status: DC | PRN

## 2020-10-15 MED ORDER — DEXAMETHASONE SODIUM PHOSPHATE 10 MG/ML IJ SOLN
10.0000 mg | Freq: Once | INTRAMUSCULAR | Status: AC
  Administered 2020-10-15: 10 mg via INTRAVENOUS
  Filled 2020-10-15: qty 1

## 2020-10-15 MED ORDER — ZINC SULFATE 220 (50 ZN) MG PO CAPS
220.0000 mg | ORAL_CAPSULE | Freq: Every day | ORAL | Status: DC
  Administered 2020-10-17 – 2020-10-18 (×2): 220 mg via ORAL
  Filled 2020-10-15 (×2): qty 1

## 2020-10-15 MED ORDER — IPRATROPIUM-ALBUTEROL 20-100 MCG/ACT IN AERS
1.0000 | INHALATION_SPRAY | Freq: Four times a day (QID) | RESPIRATORY_TRACT | Status: DC
  Administered 2020-10-16 – 2020-10-17 (×6): 1 via RESPIRATORY_TRACT
  Filled 2020-10-15: qty 4

## 2020-10-15 MED ORDER — VANCOMYCIN HCL 750 MG/150ML IV SOLN
750.0000 mg | INTRAVENOUS | Status: DC
  Administered 2020-10-15: 750 mg via INTRAVENOUS
  Filled 2020-10-15: qty 150

## 2020-10-15 MED ORDER — POTASSIUM CHLORIDE IN NACL 20-0.45 MEQ/L-% IV SOLN
INTRAVENOUS | Status: DC
  Filled 2020-10-15 (×2): qty 1000

## 2020-10-15 MED ORDER — SODIUM CHLORIDE 0.9 % IV SOLN
100.0000 mg | Freq: Every day | INTRAVENOUS | Status: DC
  Administered 2020-10-16 – 2020-10-17 (×2): 100 mg via INTRAVENOUS
  Filled 2020-10-15 (×3): qty 20

## 2020-10-15 MED ORDER — BRIMONIDINE TARTRATE 0.2 % OP SOLN
1.0000 [drp] | Freq: Two times a day (BID) | OPHTHALMIC | Status: DC
  Administered 2020-10-16 – 2020-10-18 (×5): 1 [drp] via OPHTHALMIC
  Filled 2020-10-15: qty 5

## 2020-10-15 MED ORDER — LACTATED RINGERS IV BOLUS
500.0000 mL | Freq: Once | INTRAVENOUS | Status: AC
  Administered 2020-10-15: 500 mL via INTRAVENOUS

## 2020-10-15 MED ORDER — SODIUM CHLORIDE 0.9 % IV SOLN
2.0000 g | Freq: Once | INTRAVENOUS | Status: AC
  Administered 2020-10-15: 2 g via INTRAVENOUS
  Filled 2020-10-15: qty 2

## 2020-10-15 MED ORDER — BRINZOLAMIDE 1 % OP SUSP
1.0000 [drp] | Freq: Three times a day (TID) | OPHTHALMIC | Status: DC
  Administered 2020-10-15 – 2020-10-18 (×8): 1 [drp] via OPHTHALMIC
  Filled 2020-10-15: qty 10

## 2020-10-15 MED ORDER — SODIUM CHLORIDE 0.9 % IV SOLN
200.0000 mg | Freq: Once | INTRAVENOUS | Status: AC
  Administered 2020-10-15: 200 mg via INTRAVENOUS
  Filled 2020-10-15: qty 200

## 2020-10-15 MED ORDER — HYDROCOD POLST-CPM POLST ER 10-8 MG/5ML PO SUER: 5.0000 mL | Freq: Two times a day (BID) | ORAL | Status: DC | PRN

## 2020-10-15 MED ORDER — BISACODYL 10 MG RE SUPP
10.0000 mg | Freq: Every day | RECTAL | Status: DC
  Administered 2020-10-16 – 2020-10-18 (×3): 10 mg via RECTAL
  Filled 2020-10-15 (×3): qty 1

## 2020-10-15 MED ORDER — LACTATED RINGERS IV BOLUS (SEPSIS)
500.0000 mL | Freq: Once | INTRAVENOUS | Status: AC
  Administered 2020-10-15: 500 mL via INTRAVENOUS

## 2020-10-15 MED ORDER — NETARSUDIL-LATANOPROST 0.02-0.005 % OP SOLN: Freq: Every day | OPHTHALMIC | Status: DC

## 2020-10-15 MED ORDER — BRIMONIDINE TARTRATE-TIMOLOL 0.2-0.5 % OP SOLN
1.0000 [drp] | Freq: Two times a day (BID) | OPHTHALMIC | Status: DC
  Filled 2020-10-15: qty 5

## 2020-10-15 MED ORDER — BARICITINIB 2 MG PO TABS
2.0000 mg | ORAL_TABLET | Freq: Every day | ORAL | Status: DC
  Administered 2020-10-17: 2 mg via ORAL
  Filled 2020-10-15: qty 1

## 2020-10-15 MED ORDER — ACETAMINOPHEN 325 MG PO TABS: 650.0000 mg | ORAL_TABLET | Freq: Four times a day (QID) | ORAL | Status: DC | PRN

## 2020-10-15 MED ORDER — TIMOLOL MALEATE 0.5 % OP SOLN
1.0000 [drp] | Freq: Two times a day (BID) | OPHTHALMIC | Status: DC
  Administered 2020-10-16 – 2020-10-18 (×5): 1 [drp] via OPHTHALMIC
  Filled 2020-10-15: qty 5

## 2020-10-15 MED ORDER — METRONIDAZOLE IN NACL 5-0.79 MG/ML-% IV SOLN
500.0000 mg | Freq: Once | INTRAVENOUS | Status: AC
  Administered 2020-10-15: 500 mg via INTRAVENOUS
  Filled 2020-10-15: qty 100

## 2020-10-15 MED ORDER — METHYLPREDNISOLONE SODIUM SUCC 125 MG IJ SOLR
1.0000 mg/kg | Freq: Two times a day (BID) | INTRAMUSCULAR | Status: DC
  Administered 2020-10-16 – 2020-10-18 (×5): 55 mg via INTRAVENOUS
  Filled 2020-10-15 (×5): qty 2

## 2020-10-15 MED ORDER — ENOXAPARIN SODIUM 30 MG/0.3ML ~~LOC~~ SOLN
30.0000 mg | SUBCUTANEOUS | Status: DC
  Administered 2020-10-15: 30 mg via SUBCUTANEOUS
  Filled 2020-10-15: qty 0.3

## 2020-10-15 NOTE — Progress Notes (Signed)
A consult was received from an ED physician for vancomycin and cefepime per pharmacy dosing (for an indication other than meningitis). The patient's profile has been reviewed for ht/wt/allergies/indication/available labs. A one time order has been placed for the above antibiotics.  Further antibiotics/pharmacy consults should be ordered by admitting physician if indicated.                       Reuel Boom, PharmD, BCPS [phone] 10/15/2020, 9:50 AM

## 2020-10-15 NOTE — ED Provider Notes (Signed)
[place] DEPT Provider Note   CSN: [id number] Arrival date & time: 10/15/20  2952     History Chief Complaint  Patient presents with  . Shortness of Breath    Justin Griffin is a 80 y.o. male.  Justin Griffin presents for [facility] via EMS with concern for hypoxia in the setting of known COVID infection (positive on 10/12/20).  Patient is verbally unresponsive is not responding to painful stimuli nor voice.  Per EMS report, patient placed on nonrebreather prior to arrival.  Patient with initially normal vital signs.  Upon my exam, patient is tachycardic to 119, tachypneic to the 30s and remains nonresponsive to verbal or painful stimuli.        Past Medical History:  Diagnosis Date  . Alzheimer's disease ([place])   . CAD (coronary artery disease)    Catheterization 2006 LAD occluded, OM 95% stenosis followed by mid occlusion, first obtuse marginal occluded, right coronary artery diffuse moderate disease. LIMA to the LAD was patent. Saphenous vein to PDA and posterior lateral patent with diffuse luminal irregularities, saphenous vein graft to second obtuse marginal is patent, saphenous vein graft to the first obtuse marginal had 25% stenosis.  . Esophageal reflux   . Esophageal stricture   . Other and unspecified hyperlipidemia   . Other dysphagia   . Rheumatoid arthritis(714.0)   . Status post dilation of esophageal narrowing   . Unspecified essential hypertension     Patient Active Problem List   Diagnosis Date Noted  . Pneumonia due to COVID-19 virus 10/15/2020  . 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 Ileus ([place]) 01/14/2020  . Adynamic ileus ([place]) 03/21/2019  . Dementia without behavioral disturbance ([employer])   . Goals of care, counseling/discussion   . Palliative care by specialist   . Pressure injury of skin 03/20/2019  . Abdominal distention   . Sepsis ([place]) 03/19/2019  . Acute hypernatremia 03/19/2019  . Hypokalemia 03/19/2019  . Anemia 03/19/2019  .  Dehydration   . Delirium 06/11/2017  . Acute kidney injury ([place]) 06/11/2017  . Fever 06/11/2017  . Effusion of right knee 06/11/2017  . Inguinal hernia 12/24/2016  . Testicular pain, left 08/07/2016  . Hemorrhoid 08/07/2016  . 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 Routine general medical examination at a health care facility 01/27/2016  . Medicare annual wellness visit, subsequent 01/27/2016  . Essential hypertension, benign 08/08/2009  . GERD 07/04/2008  . RHEUMATOID ARTHRITIS 07/04/2008  . WEIGHT LOSS-ABNORMAL 07/04/2008  . DYSPHAGIA 07/04/2008  . TOBACCO USER 07/03/2008  . POLYP, COLON 02/04/2008  . DYSLIPIDEMIA 02/04/2008  . Essential hypertension 02/04/2008  . Coronary artery disease 02/04/2008  . POLYPECTOMY, HX OF 02/04/2008  . CORONARY ARTERY BYPASS GRAFT, HX OF 02/04/2008    Past Surgical History:  Procedure Laterality Date  . CATARACT EXTRACTION, BILATERAL    . CORONARY ARTERY BYPASS GRAFT  1996  . INGUINAL HERNIA REPAIR Right 04/05/2017   Procedure: OPEN RIGHT INGUINAL HERNIA REPAIR;  Surgeon: Alphonsa Overall, MD;  Location: [facility];  Service: General;  Laterality: Right;  . INSERTION OF MESH Right 04/05/2017   Procedure: INSERTION OF MESH;  Surgeon: Alphonsa Overall, MD;  Location: [facility];  Service: General;  Laterality: Right;  . left digit amputation         Family History  Problem Relation Age of Onset  . Heart disease Maternal Grandfather   . Prostate cancer Paternal Grandfather   . Heart disease Mother   .  Colon cancer Neg Hx   . Esophageal cancer Neg Hx   . Stomach cancer Neg Hx   . Pancreatic cancer Neg Hx   . Liver disease Neg Hx     Social History   Tobacco Use  . Smoking status: Former Smoker    Years: 0.00    Types: Cigarettes    Quit date: 10/05/1994    Years since quitting: 26.0  . Smokeless tobacco: Never Used  Vaping Use  . Vaping Use: Never used  Substance Use Topics  . Alcohol use: No  . Drug  use: No    Home Medications Prior to Admission medications   Medication Sig Start Date End Date Taking? Authorizing Provider  aspirin EC 81 MG tablet Take 81 mg by mouth daily.   Yes [provider]  bisacodyl (DULCOLAX) 10 MG suppository Place 1 suppository (10 mg total) rectally daily. 01/20/20  Yes Mariel Aloe, MD  brimonidine-timolol (COMBIGAN) 0.2-0.5 % ophthalmic solution Place 1 drop into the left eye every 12 (twelve) hours.   Yes [provider]  brinzolamide (AZOPT) 1 % ophthalmic suspension Place 1 drop into the left eye 3 (three) times daily.   Yes [provider]  Dextromethorphan Polistirex (ROBITUSSIN 12 HOUR COUGH PO) Take 10 mLs by mouth every 4 (four) hours as needed (cough).   Yes [provider]  ipratropium-albuterol (DUONEB) 0.5-2.5 (3) MG/3ML SOLN Take 3 mLs by nebulization every 6 (six) hours as needed (wheezing).   Yes [provider]  Netarsudil-Latanoprost (ROCKLATAN OP) Place 1 drop into both eyes daily.   Yes [provider]  pantoprazole (PROTONIX) 40 MG tablet Take 40 mg by mouth daily before breakfast.   Yes [provider]  polyethylene glycol (MIRALAX / GLYCOLAX) 17 g packet Take 17 g by mouth daily.   Yes [provider]  potassium chloride SA (KLOR-CON) 20 MEQ tablet Take 40 mEq by mouth daily.   Yes [provider]    Allergies    Ace inhibitors, Lisinopril, and Zocor [simvastatin]  Review of Systems   Review of Systems  Unable to perform ROS: Patient nonverbal    Physical Exam Updated Vital Signs BP 112/86   Pulse (!) 108   Temp 98.9 F (37.2 C) (Axillary)   Resp (!) 36   SpO2 96%   Physical Exam Vitals reviewed.  Constitutional:      Comments: Cachetic, unresponsive to verbal and painful stimuli   HENT:     Head: Normocephalic.  Eyes:     Comments: Copious secretions, eye lids crusted   Cardiovascular:     Rate and Rhythm: Tachycardia present.   Pulmonary:     Effort: Tachypnea, accessory muscle usage and respiratory distress present.     Comments: Labored respiratory effort, belly breathing, mouth breathing, retractions, upper airway sounds transmitted on pulmonary exam  Abdominal:     Palpations: Abdomen is soft.     Comments: Distended   Musculoskeletal:     Comments: Digital clubbing   Skin:    General: Skin is warm.  Neurological:     Comments: Withdrawn, non-responsive to verbal nor painful stimuli     ED Results / Procedures / Treatments   Labs (all labs ordered are listed, but only abnormal results are displayed) Labs Reviewed  LACTIC ACID, PLASMA - Abnormal; Notable for the following components:      Result Value   Lactic Acid, Venous 4.2 (*)    All other components within normal limits  CBC  WITH DIFFERENTIAL/PLATELET - Abnormal; Notable for the following components:   WBC 13.7 (*)    RBC 6.60 (*)    MCV 72.0 (*)    MCH 20.5 (*)    MCHC 28.4 (*)    RDW 24.0 (*)    Platelets 427 (*)    nRBC 0.6 (*)    Neutro Abs 11.9 (*)    Abs Immature Granulocytes 0.08 (*)    All other components within normal limits  COMPREHENSIVE METABOLIC PANEL - Abnormal; Notable for the following components:   Sodium 161 (*)    Potassium 3.2 (*)    Chloride 127 (*)    CO2 19 (*)    Glucose, Bld 147 (*)    BUN 50 (*)    Creatinine, Ser 1.77 (*)    Total Protein 8.4 (*)    Albumin 3.4 (*)    Total Bilirubin 1.5 (*)    GFR, Estimated 39 (*)    All other components within normal limits  D-DIMER, QUANTITATIVE (NOT AT [facility]) - Abnormal; Notable for the following components:   D-Dimer, Quant 2.11 (*)    All other components within normal limits  FIBRINOGEN - Abnormal; Notable for the following components:   Fibrinogen 720 (*)    All other components within normal limits  C-REACTIVE PROTEIN - Abnormal; Notable for the following components:   CRP 19.5 (*)    All other components within normal limits  CULTURE, BLOOD (ROUTINE X  2)  CULTURE, BLOOD (ROUTINE X 2)  PROCALCITONIN  LACTATE DEHYDROGENASE  FERRITIN  TRIGLYCERIDES  LACTIC ACID, PLASMA    EKG EKG Interpretation  Date/Time:  Tuesday October 15 2020 08:31:39 EST Ventricular Rate:  113 PR Interval:    QRS Duration: 97 QT Interval:  328 QTC Calculation: 450 R Axis:   -12 Text Interpretation: Sinus tachycardia Probable left atrial enlargement Abnormal R-wave progression, late transition LVH with secondary repolarization abnormality Inferior infarct, old No significant change since last tracing No STEMI Confirmed by Nanda Quinton [phone]) on 10/15/2020 8:40:23 AM   Radiology DG Chest Port 1 View  Result Date: 10/15/2020 CLINICAL DATA:  80 year old male with hypoxia, COVID positive. EXAM: PORTABLE CHEST 1 VIEW COMPARISON:  10/12/2020 FINDINGS: The heart size and mediastinal contours are within normal limits. Similar appearing postsurgical changes after CABG. Both lungs are clear. Similar appearing massively dilated loops of bowel in the bilateral upper abdomen. Atherosclerotic calcification of the aortic arch. No acute osseous abnormality. IMPRESSION: No acute cardiopulmonary process. Unchanged massively dilated loops of bowel in the bilateral upper quadrants. Electronically Signed   By: Ruthann Cancer MD   On: 10/15/2020 07:56    Procedures Procedures (including critical care time)  Medications Ordered in ED Medications  remdesivir 200 mg in sodium chloride 0.9% 250 mL IVPB (200 mg Intravenous New Bag/Given 10/15/20 0857)    Followed by  remdesivir 100 mg in sodium chloride 0.9 % 100 mL IVPB (has no administration in time range)  lactated ringers bolus 500 mL (has no administration in time range)  ceFEPIme (MAXIPIME) 2 g in sodium chloride 0.9 % 100 mL IVPB (has no administration in time range)  metroNIDAZOLE (FLAGYL) IVPB 500 mg (has no administration in time range)  vancomycin (VANCOCIN) IVPB 1000 mg/200 mL premix (has no administration in time range)   lactated ringers bolus 500 mL (has no administration in time range)  dexamethasone (DECADRON) injection 10 mg (10 mg Intravenous Given 10/15/20 0856)    ED Course  I have reviewed the triage vital  signs and the nursing notes.  Pertinent labs & imaging results that were available during my care of the patient were reviewed by me and considered in my medical decision making (see chart for details).  Clinical Course as of 10/15/20 1008  Tue Oct 15, 2020  1610 Patient arrived via EMS, vitals becoming increasingly tachycardic 119 at bedside and increasing tachypnea 33 at bedside. Patient does not respond to verbal or painful stimuli, on 15 L NRB mask with accessory muscle use. Started on remdesivir and decadron.  [MS]  [phone] Electrolyte abnormalities including hypernatremia 161, hyperchloremia 127, elevated CRP 19.5, elevated WBC 13.7, elevated fibrinogen and AKI Cr 1.77 with GFR 39, patient given 535mL LR bolus and started on abx for meeting SIRS criteria  [MS]    Clinical Course User Index [MS] Simmons-Robinson, Riki Sheer, MD   MDM Rules/Calculators/A&P                          Justin Griffin is a 80 year old male with hx of late onset Alzheimer's, RA, CAD s/p bypass grafting, presenting with decreased responsiveness and hypoxia in setting of known COVID-019 infection. Patient exhibiting signs of respiratory distress with accessory muscle use, tachypnea to rate of 33 and tachycardia to 119. Patient is not responsive to verbal nor painful stimuli. Patient will be treated for COVID-19 infection and started Remdesivir and Decadron while on 15 L NRB.  Given patient's current condition and concern for rapid deterioration, discussed with both daughters Lawerance Bach and [name] who confirmed patient as DNR status and therefore no intubation if patient were to decompensate. Given his presentation,considered patient for CTA to evaluate for PE, however, concern for further renal injury given AKI.D-dimer elevated at  2.11. Will order V/Q scan.  Patient's labs notable for AKI with Cr. 1.77, estimated GFR 39, elevated inflammatory markers including CRP of 19.5 increased from 4.5 on 10/12/20, fibrinogen 720, LA 4.2 increased from 1.4. Leukocytosis of 13.7. Patient started on Vanc, Cefepime and Metronidazole. Patient also found to have hypernatremia that has worsened to 161 from 155 during prior ED visit, patient appears hypovolemic on exam which is consistent with lab values.   Ultimately, patient requires admission for hypoxic respiratory failure 2/2 to COVID infection with concern for PE, AKI and hypernatremia and overall poor condition with grave prognosis. Discussed consult with Dr. Marylyn Ishihara who has agreed to admit patient to hospitalist service.   Final Clinical Impression(s) / ED Diagnoses Final diagnoses:  COVID-19  AKI (acute kidney injury) ([place])  Dehydration with hypernatremia    Rx / DC Orders ED Discharge Orders    None       Eulis Foster, MD 10/15/20 1008    Margette Fast, MD 10/17/20 1026

## 2020-10-15 NOTE — Progress Notes (Signed)
Pharmacy Antibiotic Note  Justin Griffin is a 80 y.o. male admitted on 10/15/2020 with sepsis d/t COVID.  Pharmacy has been consulted for vancomycin and cefepime dosing. Vanc, cefepime, Flagyl given x 1 in ED  Plan:  Vancomycin 1000 mg IV now, then 750 mg IV q24 hr (est AUC 462 based on SCr 1.77; Vd 0.72)  Measure vancomycin AUC at steady state as indicated  SCr daily x3 while on vanc  Cefepime 2 g IV q12 hr     Temp (24hrs), Avg:98.9 F (37.2 C), Min:98.9 F (37.2 C), Max:98.9 F (37.2 C)  Recent Labs  Lab 10/12/20 1741 10/12/20 1941 10/15/20 0732  WBC 7.2  --  13.7*  CREATININE 0.89  --  1.77*  LATICACIDVEN 1.4 1.8 4.2*    CrCl cannot be calculated (Unknown ideal weight.).    Allergies  Allergen Reactions  . Ace Inhibitors Other (See Comments)    "Allergic," per paperwork from [facility]  . Lisinopril Other (See Comments)    "Allergic," per paperwork from [facility]  . Zocor [Simvastatin] Other (See Comments)    Muscle pain     Thank you for allowing pharmacy to be a part of this patient's care.  Jonty Morrical A 10/15/2020 11:46 AM

## 2020-10-15 NOTE — ED Triage Notes (Signed)
Patient arrived via [facility] from [facility] after stating he was not responding to verbal stimuli. Patient on a non-rebreather on arrival. Covid-19 positive.

## 2020-10-15 NOTE — H&P (Signed)
History and Physical    Justin Griffin [id number] DOB: 03/17/1941 DOA: 10/15/2020  PCP: [facility]  Patient coming from: [place]  Chief Complaint: Hypoxia   HPI: Justin Griffin is a 80 y.o. male with medical history significant of Alzheimer's disease. Presenting with hypoxia. Pt is unresponsive. Hx is from chart review and dtr, [name]. Patient has been at his baseline state (non-verbal, non-responsive) for most of this week. His dtr reports that while she was visiting him a few days ago, she saw that he was getting hypoxic, so she asked for evaluation. He was transported to ED and found to be COVID+ with a normal O2 sat at the time. He was sent back to his facility w/ recommendation for MAB. Over the last couple of days, he has seemed to do downhill. Apparently when seen this AM, he was severely hypoxic. He was sent back to the ED for examination.    ED Course: He was found to be extremely hypoxic and required 15L NRB. CXR was clear. Had an elevated d-dimer compared to 1/8. Unable to do CTA, so Q scan ordered. He was started on remdes, steroids and broad spec abx. TRH was called for admission.   Review of Systems:  Unable to obtain d/t mentation  PMHx Past Medical History:  Diagnosis Date  . Alzheimer's disease ([place])   . CAD (coronary artery disease)    Catheterization 2006 LAD occluded, OM 95% stenosis followed by mid occlusion, first obtuse marginal occluded, right coronary artery diffuse moderate disease. LIMA to the LAD was patent. Saphenous vein to PDA and posterior lateral patent with diffuse luminal irregularities, saphenous vein graft to second obtuse marginal is patent, saphenous vein graft to the first obtuse marginal had 25% stenosis.  . Esophageal reflux   . Esophageal stricture   . Other and unspecified hyperlipidemia   . Other dysphagia   . Rheumatoid arthritis(714.0)   . Status post dilation of esophageal narrowing   . Unspecified  essential hypertension     PSHx Past Surgical History:  Procedure Laterality Date  . CATARACT EXTRACTION, BILATERAL    . CORONARY ARTERY BYPASS GRAFT  1996  . INGUINAL HERNIA REPAIR Right 04/05/2017   Procedure: OPEN RIGHT INGUINAL HERNIA REPAIR;  Surgeon: Alphonsa Overall, MD;  Location: [facility];  Service: General;  Laterality: Right;  . INSERTION OF MESH Right 04/05/2017   Procedure: INSERTION OF MESH;  Surgeon: Alphonsa Overall, MD;  Location: [facility];  Service: General;  Laterality: Right;  . left digit amputation      SocHx  reports that he quit smoking about 26 years ago. His smoking use included cigarettes. He quit after 0.00 years of use. He has never used smokeless tobacco. He reports that he does not drink alcohol and does not use drugs.  Allergies  Allergen Reactions  . Ace Inhibitors Other (See Comments)    "Allergic," per paperwork from [facility]  . Lisinopril Other (See Comments)    "Allergic," per paperwork from [facility]  . Zocor [Simvastatin] Other (See Comments)    Muscle pain    FamHx Family History  Problem Relation Age of Onset  . Heart disease Maternal Grandfather   . Prostate cancer Paternal Grandfather   . Heart disease Mother   . Colon cancer Neg Hx   . Esophageal cancer Neg Hx   . Stomach cancer Neg Hx   . Pancreatic cancer Neg Hx   . Liver disease Neg Hx  Prior to Admission medications   Medication Sig Start Date End Date Taking? Authorizing Provider  aspirin EC 81 MG tablet Take 81 mg by mouth daily.   Yes [provider]  bisacodyl (DULCOLAX) 10 MG suppository Place 1 suppository (10 mg total) rectally daily. 01/20/20  Yes Mariel Aloe, MD  brimonidine-timolol (COMBIGAN) 0.2-0.5 % ophthalmic solution Place 1 drop into the left eye every 12 (twelve) hours.   Yes [provider]  brinzolamide (AZOPT) 1 % ophthalmic suspension Place 1 drop into the left eye 3 (three) times daily.   Yes [provider]  Dextromethorphan  Polistirex (ROBITUSSIN 12 HOUR COUGH PO) Take 10 mLs by mouth every 4 (four) hours as needed (cough).   Yes [provider]  ipratropium-albuterol (DUONEB) 0.5-2.5 (3) MG/3ML SOLN Take 3 mLs by nebulization every 6 (six) hours as needed (wheezing).   Yes [provider]  Netarsudil-Latanoprost (ROCKLATAN OP) Place 1 drop into both eyes daily.   Yes [provider]  pantoprazole (PROTONIX) 40 MG tablet Take 40 mg by mouth daily before breakfast.   Yes [provider]  polyethylene glycol (MIRALAX / GLYCOLAX) 17 g packet Take 17 g by mouth daily.   Yes [provider]  potassium chloride SA (KLOR-CON) 20 MEQ tablet Take 40 mEq by mouth daily.   Yes [provider]    Physical Exam: Vitals:   10/15/20 0708 10/15/20 0710 10/15/20 0945  BP: 113/83  112/86  Pulse: (!) 110  (!) 108  Resp: (!) 32  (!) 36  Temp:  98.9 F (37.2 C)   TempSrc:  Axillary   SpO2: 96%  96%    General: 80 y.o. chronically ill appearing male resting in bed ENMT: Nares patent w/o discharge, ears w/o discharge/lesions/ulcers Neck: trachea midline, cachectic appearing  Cardiovascular: tachy, +S1, S2, no m/g/r, equal pulses throughout Respiratory: tachypneic, decreased at bases as he is shallow breathing, no definitive crackles, rhonchi on exam, increased WOB in NRB 15L GI: BS+, NDNT, no masses noted, no organomegaly noted MSK: No e/c/c Skin: No rashes, bruises, ulcerations noted Neuro: non-responsive to verbal/noxious stim; DTRs intact  Labs on Admission: I have personally reviewed following labs and imaging studies  CBC: Recent Labs  Lab 10/12/20 1741 10/15/20 0732  WBC 7.2 13.7*  NEUTROABS 4.9 11.9*  HGB 11.0* 13.5  HCT 39.1 47.5  MCV 71.1* 72.0*  PLT 287 063*   Basic Metabolic Panel: Recent Labs  Lab 10/12/20 1741 10/15/20 0732  NA 155* 161*  K 3.6 3.2*  CL 123* 127*  CO2 20* 19*  GLUCOSE 105* 147*  BUN 22 50*  CREATININE 0.89 1.77*  CALCIUM  9.2 9.0   GFR: CrCl cannot be calculated (Unknown ideal weight.). Liver Function Tests: Recent Labs  Lab 10/12/20 1741 10/15/20 0732  AST 40 20  ALT 42 23  ALKPHOS 70 74  BILITOT 0.6 1.5*  PROT 7.6 8.4*  ALBUMIN 3.4* 3.4*   No results for input(s): LIPASE, AMYLASE in the last 168 hours. No results for input(s): AMMONIA in the last 168 hours. Coagulation Profile: No results for input(s): INR, PROTIME in the last 168 hours. Cardiac Enzymes: No results for input(s): CKTOTAL, CKMB, CKMBINDEX, TROPONINI in the last 168 hours. BNP (last 3 results) No results for input(s): PROBNP in the last 8760 hours. HbA1C: No results for input(s): HGBA1C in the last 72 hours. CBG: No results for input(s): GLUCAP in the last 168 hours. Lipid Profile: Recent Labs    10/12/20 1741 10/15/20  0732  TRIG 65 106   Thyroid Function Tests: No results for input(s): TSH, T4TOTAL, FREET4, T3FREE, THYROIDAB in the last 72 hours. Anemia Panel: Recent Labs    10/12/20 1741 10/15/20 0732  FERRITIN 65 195   Urine analysis:    Component Value Date/Time   COLORURINE STRAW (A) 05/13/2020 1142   APPEARANCEUR CLEAR 05/13/2020 1142   LABSPEC 1.009 05/13/2020 1142   PHURINE 5.0 05/13/2020 1142   GLUCOSEU NEGATIVE 05/13/2020 1142   HGBUR NEGATIVE 05/13/2020 1142   BILIRUBINUR NEGATIVE 05/13/2020 1142   KETONESUR NEGATIVE 05/13/2020 1142   PROTEINUR NEGATIVE 05/13/2020 1142   NITRITE NEGATIVE 05/13/2020 1142   LEUKOCYTESUR NEGATIVE 05/13/2020 1142    Radiological Exams on Admission: DG Chest Port 1 View  Result Date: 10/15/2020 CLINICAL DATA:  80 year old male with hypoxia, COVID positive. EXAM: PORTABLE CHEST 1 VIEW COMPARISON:  10/12/2020 FINDINGS: The heart size and mediastinal contours are within normal limits. Similar appearing postsurgical changes after CABG. Both lungs are clear. Similar appearing massively dilated loops of bowel in the bilateral upper abdomen. Atherosclerotic calcification of  the aortic arch. No acute osseous abnormality. IMPRESSION: No acute cardiopulmonary process. Unchanged massively dilated loops of bowel in the bilateral upper quadrants. Electronically Signed   By: Ruthann Cancer MD   On: 10/15/2020 07:56    EKG: Independently reviewed. Sinus tachy, no st elevations  Assessment/Plan Sepsis secondary to COVID-19 infection Acute respiratory failure w/ hypoxia secondary to above     - admit to inpt, SDU     - steroids, remdes, baricitinib, inhalers, anti-tussives, IS, FV     - currently on 15L NRB, DNR patient at max support right now, wean O2 as able     - follow inflammatory markers     - checking Q scan for possible PTE     - follow UA/UCx, bld Cx  AKI Dehydration Hypernatremia     - fluids, check renal US     - q6h renal function panels     - 1/2 NS w/ K+   Acute metabolic encephalopathy on chronic encephalopathy     - infection superimposed on alzheimer's dementia     - at baseline, he is total care w/ very little interaction  Hypokalemia     - replace K+, check Mg2+  Severe protein-calorie malnutrition     - dietary consult  DNR patient     - family confirms DNR, they have been updated on his grave condition, agree w/ current plan  Microcytic anemia     - currently hemoconcentrated d/t dehydration     - no evidence of bleed, follow  DVT prophylaxis: lovenox  Code Status: DNR  Family Communication: w/ dtr, [name], by phone  Consults called: None   Status is: Inpatient  Remains inpatient appropriate because:Inpatient level of care appropriate due to severity of illness   Dispo: The patient is from: SNF              Anticipated d/c is to: SNF              Anticipated d/c date is: > 3 days              Patient currently is not medically stable to d/c.  Jonnie Finner DO [facility]  If 7PM-7AM, please contact night-coverage [email or web address]  10/15/2020, 10:15 AM

## 2020-10-15 NOTE — ED Notes (Signed)
ED TO INPATIENT HANDOFF REPORT  ED Nurse Name and Phone #: [phone]  S Name/Age/Gender Justin Griffin 80 y.o. male Room/Bed: WA04/WA04  Code Status   Code Status: DNR  Home/SNF/Other Skilled nursing facility Patient oriented to: self Is this baseline? Yes   Triage Complete: Triage complete  Chief Complaint Pneumonia due to COVID-19 virus [U07.1, J12.82] COVID-19 [U07.1]  Triage Note Patient arrived via [facility] from [facility] after stating he was not responding to verbal stimuli. Patient on a non-rebreather on arrival. Covid-19 positive.      Allergies Allergies  Allergen Reactions  . Ace Inhibitors Other (See Comments)    "Allergic," per paperwork from [facility]  . Lisinopril Other (See Comments)    "Allergic," per paperwork from [facility]  . Zocor [Simvastatin] Other (See Comments)    Muscle pain    Level of Care/Admitting Diagnosis ED Disposition    ED Disposition Condition Comment   Admit  Hospital Area: [facility] [[id number]]  Level of Care: Progressive [102]  Admit to Progressive based on following criteria: RESPIRATORY PROBLEMS hypoxemic/hypercapnic respiratory failure that is responsive to NIPPV (BiPAP) or High Flow Nasal Cannula (6-80 lpm). Frequent assessment/intervention, no > Q2 hrs < Q4 hrs, to maintain oxygenation and pulmonary hygiene.  May admit patient to [facility] or [facility] if equivalent level of care is available:: No  Covid Evaluation: Confirmed COVID Positive  Diagnosis: COVID-19 [[id number]]  Admitting Physician: Jonnie Finner [[id number]]  Attending Physician: Jonnie Finner [[id number]]  Estimated length of stay: past midnight tomorrow  Certification:: I certify this patient will need inpatient services for at least 2 midnights       B Medical/Surgery History Past Medical History:  Diagnosis Date  . Alzheimer's disease ([place])   . CAD (coronary artery disease)    Catheterization 2006 LAD occluded, OM 95% stenosis  followed by mid occlusion, first obtuse marginal occluded, right coronary artery diffuse moderate disease. LIMA to the LAD was patent. Saphenous vein to PDA and posterior lateral patent with diffuse luminal irregularities, saphenous vein graft to second obtuse marginal is patent, saphenous vein graft to the first obtuse marginal had 25% stenosis.  . Esophageal reflux   . Esophageal stricture   . Other and unspecified hyperlipidemia   . Other dysphagia   . Rheumatoid arthritis(714.0)   . Status post dilation of esophageal narrowing   . Unspecified essential hypertension    Past Surgical History:  Procedure Laterality Date  . CATARACT EXTRACTION, BILATERAL    . CORONARY ARTERY BYPASS GRAFT  1996  . INGUINAL HERNIA REPAIR Right 04/05/2017   Procedure: OPEN RIGHT INGUINAL HERNIA REPAIR;  Surgeon: Alphonsa Overall, MD;  Location: [facility];  Service: General;  Laterality: Right;  . INSERTION OF MESH Right 04/05/2017   Procedure: INSERTION OF MESH;  Surgeon: Alphonsa Overall, MD;  Location: [facility];  Service: General;  Laterality: Right;  . left digit amputation       A IV Location/Drains/Wounds Patient Lines/Drains/Airways Status    Active Line/Drains/Airways    Name Placement date Placement time Site Days   Peripheral IV 10/15/20 Left Antecubital 10/15/20  0707  Antecubital  less than 1   Peripheral IV 10/15/20 Left Antecubital 10/15/20  0828  Antecubital  less than 1   Pressure Injury 03/20/19 Ankle Left;Lateral Unstageable - Full thickness tissue loss in which the base of the ulcer is covered by slough (yellow, tan, gray, green or brown) and/or eschar (tan, brown or black) in the wound bed.  03/20/19  1500  -- 575   Wound / Incision (Open or Dehisced) 03/20/19 Other (Comment) Hand Anterior;Right  03/20/19  0710  Hand  575          Intake/Output Last 24 hours  Intake/Output Summary (Last 24 hours) at 10/15/2020 1623 Last data filed at 10/15/2020 1551 Gross per 24 hour  Intake 2150 ml  Output --   Net 2150 ml    Labs/Imaging Results for orders placed or performed during the hospital encounter of 10/15/20 (from the past 48 hour(s))  Lactic acid, plasma     Status: Abnormal   Collection Time: 10/15/20  7:32 AM  Result Value Ref Range   Lactic Acid, Venous 4.2 (HH) 0.5 - 1.9 mmol/L    Comment: CRITICAL RESULT CALLED TO, READ BACK BY AND VERIFIED WITH: WEST,S @ 0915 ON [id number] BY POTEAT,S Performed at [facility], [place] [address] [place], [place] [address]   CBC WITH DIFFERENTIAL     Status: Abnormal   Collection Time: 10/15/20  7:32 AM  Result Value Ref Range   WBC 13.7 (H) 4.0 - 10.5 K/uL   RBC 6.60 (H) 4.22 - 5.81 MIL/uL   Hemoglobin 13.5 13.0 - 17.0 g/dL   HCT 47.5 39.0 - 52.0 %   MCV 72.0 (L) 80.0 - 100.0 fL   MCH 20.5 (L) 26.0 - 34.0 pg   MCHC 28.4 (L) 30.0 - 36.0 g/dL   RDW 24.0 (H) 11.5 - 15.5 %   Platelets 427 (H) 150 - 400 K/uL   nRBC 0.6 (H) 0.0 - 0.2 %   Neutrophils Relative % 87 %   Neutro Abs 11.9 (H) 1.7 - 7.7 K/uL   Lymphocytes Relative 6 %   Lymphs Abs 0.8 0.7 - 4.0 K/uL   Monocytes Relative 6 %   Monocytes Absolute 0.8 0.1 - 1.0 K/uL   Eosinophils Relative 0 %   Eosinophils Absolute 0.1 0.0 - 0.5 K/uL   Basophils Relative 0 %   Basophils Absolute 0.0 0.0 - 0.1 K/uL   Immature Granulocytes 1 %   Abs Immature Granulocytes 0.08 (H) 0.00 - 0.07 K/uL   Polychromasia PRESENT    Target Cells PRESENT     Comment: Performed at [facility], [place] [address], [place], [place] [address]  Comprehensive metabolic panel     Status: Abnormal   Collection Time: 10/15/20  7:32 AM  Result Value Ref Range   Sodium 161 (HH) 135 - 145 mmol/L    Comment: REPEATED TO VERIFY CRITICAL RESULT CALLED TO, READ BACK BY AND VERIFIED WITH: QUICK,M. RN AT [id number] 10/15/20 MULLINS,T    Potassium 3.2 (L) 3.5 - 5.1 mmol/L   Chloride 127 (H) 98 - 111 mmol/L   CO2 19 (L) 22 - 32 mmol/L   Glucose, Bld 147 (H) 70 - 99 mg/dL    Comment: Glucose  reference range applies only to samples taken after fasting for at least 8 hours.   BUN 50 (H) 8 - 23 mg/dL   Creatinine, Ser 1.77 (H) 0.61 - 1.24 mg/dL   Calcium 9.0 8.9 - 10.3 mg/dL   Total Protein 8.4 (H) 6.5 - 8.1 g/dL   Albumin 3.4 (L) 3.5 - 5.0 g/dL   AST 20 15 - 41 U/L   ALT 23 0 - 44 U/L   Alkaline Phosphatase 74 38 - 126 U/L   Total Bilirubin 1.5 (H) 0.3 - 1.2 mg/dL   GFR, Estimated 39 (L) >60 mL/min    Comment: (  NOTE) Calculated using the CKD-EPI Creatinine Equation (2021)    Anion gap 15 5 - 15    Comment: Performed at [facility], [place] [address] [place], [place] [address]  D-dimer, quantitative     Status: Abnormal   Collection Time: 10/15/20  7:32 AM  Result Value Ref Range   D-Dimer, Quant 2.11 (H) 0.00 - 0.50 ug/mL-FEU    Comment: (NOTE) At the manufacturer cut-off value of 0.5 g/mL FEU, this assay has a negative predictive value of 95-100%.This assay is intended for use in conjunction with a clinical pretest probability (PTP) assessment model to exclude pulmonary embolism (PE) and deep venous thrombosis (DVT) in outpatients suspected of PE or DVT. Results should be correlated with clinical presentation. Performed at [facility], [place] [address] [place], [place] [address]   Procalcitonin     Status: None   Collection Time: 10/15/20  7:32 AM  Result Value Ref Range   Procalcitonin 1.27 ng/mL    Comment:        Interpretation: PCT > 0.5 ng/mL and <= 2 ng/mL: Systemic infection (sepsis) is possible, but other conditions are known to elevate PCT as well. (NOTE)       Sepsis PCT Algorithm           Lower Respiratory Tract                                      Infection PCT Algorithm    ----------------------------     ----------------------------         PCT < 0.25 ng/mL                PCT < 0.10 ng/mL          Strongly encourage             Strongly discourage   discontinuation of antibiotics    initiation of  antibiotics    ----------------------------     -----------------------------       PCT 0.25 - 0.50 ng/mL            PCT 0.10 - 0.25 ng/mL               OR       >80% decrease in PCT            Discourage initiation of                                            antibiotics      Encourage discontinuation           of antibiotics    ----------------------------     -----------------------------         PCT >= 0.50 ng/mL              PCT 0.26 - 0.50 ng/mL                AND       <80% decrease in PCT             Encourage initiation of  antibiotics       Encourage continuation           of antibiotics    ----------------------------     -----------------------------        PCT >= 0.50 ng/mL                  PCT > 0.50 ng/mL               AND         increase in PCT                  Strongly encourage                                      initiation of antibiotics    Strongly encourage escalation           of antibiotics                                     -----------------------------                                           PCT <= 0.25 ng/mL                                                 OR                                        > 80% decrease in PCT                                      Discontinue / Do not initiate                                             antibiotics  Performed at [place] [address], [place], [place] [address]   Lactate dehydrogenase     Status: None   Collection Time: 10/15/20  7:32 AM  Result Value Ref Range   LDH 177 98 - 192 U/L    Comment: Performed at [facility], [place] [address], [place], [place] [address]  Ferritin     Status: None   Collection Time: 10/15/20  7:32 AM  Result Value Ref Range   Ferritin 195 24 - 336 ng/mL    Comment: Performed at [facility], [place] [address], [place], [place] [address]  Triglycerides     Status: None    Collection Time: 10/15/20  7:32 AM  Result Value Ref Range   Triglycerides 106 <150 mg/dL    Comment: Performed at [facility], [place] [address], [place], [place] [address]  Fibrinogen     Status: Abnormal   Collection Time: 10/15/20  7:32 AM  Result Value  Ref Range   Fibrinogen 720 (H) 210 - 475 mg/dL    Comment: Performed at [facility], [place] [address] [place], [place] [address]  C-reactive protein     Status: Abnormal   Collection Time: 10/15/20  7:32 AM  Result Value Ref Range   CRP 19.5 (H) <1.0 mg/dL    Comment: Performed at [facility], [place] [address], [place], [place] [address]  Lactic acid, plasma     Status: Abnormal   Collection Time: 10/15/20  1:09 PM  Result Value Ref Range   Lactic Acid, Venous 5.2 (HH) 0.5 - 1.9 mmol/L    Comment: CRITICAL VALUE NOTED.  VALUE IS CONSISTENT WITH PREVIOUSLY REPORTED AND CALLED VALUE. Performed at [facility], [place] [address] [place], [place] [address]   Magnesium     Status: Abnormal   Collection Time: 10/15/20  1:09 PM  Result Value Ref Range   Magnesium 2.7 (H) 1.7 - 2.4 mg/dL    Comment: Performed at [facility], [place] [address] [place], [place] [address]  Urinalysis, Routine w reflex microscopic Urine, Catheterized     Status: Abnormal   Collection Time: 10/15/20  1:09 PM  Result Value Ref Range   Color, Urine AMBER (A) YELLOW    Comment: BIOCHEMICALS MAY BE AFFECTED BY COLOR   APPearance HAZY (A) CLEAR   Specific Gravity, Urine 1.028 1.005 - 1.030   pH 5.0 5.0 - 8.0   Glucose, UA NEGATIVE NEGATIVE mg/dL   Hgb urine dipstick NEGATIVE NEGATIVE   Bilirubin Urine NEGATIVE NEGATIVE   Ketones, ur NEGATIVE NEGATIVE mg/dL   Protein, ur 100 (A) NEGATIVE mg/dL   Nitrite NEGATIVE NEGATIVE   Leukocytes,Ua NEGATIVE NEGATIVE   RBC / HPF 0-5 0 - 5 RBC/hpf   WBC, UA 0-5 0 - 5 WBC/hpf   Bacteria, UA RARE (A) NONE SEEN   Mucus PRESENT     Hyaline Casts, UA PRESENT     Comment: Performed at [facility], [place] [address] [place], [place] [address]   DG Chest Port 1 View  Result Date: 10/15/2020 CLINICAL DATA:  80 year old male with hypoxia, COVID positive. EXAM: PORTABLE CHEST 1 VIEW COMPARISON:  10/12/2020 FINDINGS: The heart size and mediastinal contours are within normal limits. Similar appearing postsurgical changes after CABG. Both lungs are clear. Similar appearing massively dilated loops of bowel in the bilateral upper abdomen. Atherosclerotic calcification of the aortic arch. No acute osseous abnormality. IMPRESSION: No acute cardiopulmonary process. Unchanged massively dilated loops of bowel in the bilateral upper quadrants. Electronically Signed   By: Ruthann Cancer MD   On: 10/15/2020 07:56    Pending Labs Unresulted Labs (From admission, onward)          Start     Ordered   10/16/20 0500  Creatinine, serum  Daily,   R     Comments: While on vancomycin; may d/c once vancomycin stopped    10/15/20 1145   10/15/20 1119  Culture, Urine  Once,   STAT        10/15/20 1119   10/15/20 0723  Blood Culture (routine x 2)  BLOOD CULTURE X 2,   STAT      10/15/20 0723   Signed and Held  Protime-INR  Tomorrow morning,   R        Signed and Held   Signed and Held  Cortisol-am, blood  Tomorrow morning,   R  Signed and Held   Signed and Held  Procalcitonin  Tomorrow morning,   R        Signed and Held   Signed and Held  CBC  (enoxaparin (LOVENOX)    CrCl < 30 ml/min)  Once,   R       Comments: Baseline for enoxaparin therapy IF NOT ALREADY DRAWN.  Notify MD if PLT < 100 K.    Signed and Held   Signed and Held  Creatinine, serum  (enoxaparin (LOVENOX)    CrCl < 30 ml/min)  Once,   R       Comments: Baseline for enoxaparin therapy IF NOT ALREADY DRAWN.    Signed and Held   Signed and Held  Creatinine, serum  (enoxaparin (LOVENOX)    CrCl < 30 ml/min)  Weekly,   R     Comments: while on enoxaparin  therapy.    Signed and Held   Signed and Held  CBC with Differential/Platelet  Daily,   R      Signed and Held   Signed and Held  Comprehensive metabolic panel  Daily,   R      Signed and Held   Signed and Held  C-reactive protein  Daily,   R      Signed and Held   Signed and Held  D-dimer, quantitative (not at [facility])  Daily,   R      Signed and Held   Signed and Held  Ferritin  Daily,   R      Signed and Held   Signed and Held  Magnesium  Daily,   R      Signed and Held   Signed and Held  Phosphorus  Daily,   R      Signed and Held          Vitals/Pain Today's Vitals   10/15/20 1415 10/15/20 1430 10/15/20 1500 10/15/20 1530  BP:  105/72 128/67 107/75  Pulse: 97 95 92   Resp: (!) 24 (!) 22 (!) 26   Temp:      TempSrc:      SpO2: 100% 99% 99%     Isolation Precautions Airborne and Contact precautions  Medications Medications  remdesivir 200 mg in sodium chloride 0.9% 250 mL IVPB (0 mg Intravenous Stopped 10/15/20 1217)    Followed by  remdesivir 100 mg in sodium chloride 0.9 % 100 mL IVPB (has no administration in time range)  0.45 % NaCl with KCl 20 mEq / L infusion ( Intravenous New Bag/Given 10/15/20 1217)  baricitinib (OLUMIANT) tablet 2 mg (0 mg Oral Hold 10/15/20 1256)  vancomycin (VANCOREADY) IVPB 750 mg/150 mL (has no administration in time range)  ceFEPIme (MAXIPIME) 2 g in sodium chloride 0.9 % 100 mL IVPB (has no administration in time range)  dexamethasone (DECADRON) injection 10 mg (10 mg Intravenous Given 10/15/20 0856)  lactated ringers bolus 500 mL (0 mLs Intravenous Stopped 10/15/20 1217)  ceFEPIme (MAXIPIME) 2 g in sodium chloride 0.9 % 100 mL IVPB (0 g Intravenous Stopped 10/15/20 1218)  metroNIDAZOLE (FLAGYL) IVPB 500 mg (0 mg Intravenous Stopped 10/15/20 1551)  vancomycin (VANCOCIN) IVPB 1000 mg/200 mL premix (0 mg Intravenous Stopped 10/15/20 1551)  lactated ringers bolus 500 mL (0 mLs Intravenous Stopped 10/15/20 1257)  lactated ringers bolus 500 mL (0  mLs Intravenous Stopped 10/15/20 1551)    Mobility non-ambulatory High fall risk   Focused Assessments .   R Recommendations: See Admitting Provider Note  Report  given to:   Additional Notes: n/a

## 2020-10-16 DIAGNOSIS — U071 COVID-19: Secondary | ICD-10-CM | POA: Diagnosis not present

## 2020-10-16 LAB — CBC WITH DIFFERENTIAL/PLATELET
Abs Immature Granulocytes: 0.13 10*3/uL — ABNORMAL HIGH (ref 0.00–0.07)
Basophils Absolute: 0 10*3/uL (ref 0.0–0.1)
Basophils Relative: 0 %
Eosinophils Absolute: 0 10*3/uL (ref 0.0–0.5)
Eosinophils Relative: 0 %
HCT: 39.4 % (ref 39.0–52.0)
Hemoglobin: 11.6 g/dL — ABNORMAL LOW (ref 13.0–17.0)
Immature Granulocytes: 1 %
Lymphocytes Relative: 5 %
Lymphs Abs: 0.9 10*3/uL (ref 0.7–4.0)
MCH: 20.5 pg — ABNORMAL LOW (ref 26.0–34.0)
MCHC: 29.4 g/dL — ABNORMAL LOW (ref 30.0–36.0)
MCV: 69.5 fL — ABNORMAL LOW (ref 80.0–100.0)
Monocytes Absolute: 0.7 10*3/uL (ref 0.1–1.0)
Monocytes Relative: 4 %
Neutro Abs: 17 10*3/uL — ABNORMAL HIGH (ref 1.7–7.7)
Neutrophils Relative %: 90 %
Platelets: 306 10*3/uL (ref 150–400)
RBC: 5.67 MIL/uL (ref 4.22–5.81)
RDW: 23.9 % — ABNORMAL HIGH (ref 11.5–15.5)
WBC: 18.7 10*3/uL — ABNORMAL HIGH (ref 4.0–10.5)
nRBC: 0.2 % (ref 0.0–0.2)

## 2020-10-16 LAB — PHOSPHORUS: Phosphorus: 1.7 mg/dL — ABNORMAL LOW (ref 2.5–4.6)

## 2020-10-16 LAB — COMPREHENSIVE METABOLIC PANEL
ALT: 19 U/L (ref 0–44)
AST: 23 U/L (ref 15–41)
Albumin: 2.8 g/dL — ABNORMAL LOW (ref 3.5–5.0)
Alkaline Phosphatase: 63 U/L (ref 38–126)
BUN: 52 mg/dL — ABNORMAL HIGH (ref 8–23)
CO2: 25 mmol/L (ref 22–32)
Calcium: 8.9 mg/dL (ref 8.9–10.3)
Chloride: >130 mmol/L (ref 98–111)
Creatinine, Ser: 1.34 mg/dL — ABNORMAL HIGH (ref 0.61–1.24)
GFR, Estimated: 54 mL/min — ABNORMAL LOW (ref >60)
Glucose, Bld: 120 mg/dL — ABNORMAL HIGH (ref 70–99)
Potassium: 3 mmol/L — ABNORMAL LOW (ref 3.5–5.1)
Sodium: 162 mmol/L (ref 135–145)
Total Bilirubin: 0.8 mg/dL (ref 0.3–1.2)
Total Protein: 7.3 g/dL (ref 6.5–8.1)

## 2020-10-16 LAB — HEPARIN LEVEL (UNFRACTIONATED): Heparin Unfractionated: 0.15 IU/mL — ABNORMAL LOW (ref 0.30–0.70)

## 2020-10-16 LAB — BASIC METABOLIC PANEL
Anion gap: 8 (ref 5–15)
BUN: 48 mg/dL — ABNORMAL HIGH (ref 8–23)
CO2: 19 mmol/L — ABNORMAL LOW (ref 22–32)
Calcium: 8.4 mg/dL — ABNORMAL LOW (ref 8.9–10.3)
Chloride: 127 mmol/L — ABNORMAL HIGH (ref 98–111)
Creatinine, Ser: 1.2 mg/dL (ref 0.61–1.24)
GFR, Estimated: >60 mL/min (ref >60)
Glucose, Bld: 323 mg/dL — ABNORMAL HIGH (ref 70–99)
Potassium: 3.9 mmol/L (ref 3.5–5.1)
Sodium: 154 mmol/L — ABNORMAL HIGH (ref 135–145)

## 2020-10-16 LAB — FERRITIN: Ferritin: 164 ng/mL (ref 24–336)

## 2020-10-16 LAB — C-REACTIVE PROTEIN: CRP: 19 mg/dL — ABNORMAL HIGH (ref <1.0)

## 2020-10-16 LAB — CORTISOL-AM, BLOOD: Cortisol - AM: 26.8 ug/dL — ABNORMAL HIGH (ref 6.7–22.6)

## 2020-10-16 LAB — URINE CULTURE: Culture: NO GROWTH

## 2020-10-16 LAB — PROCALCITONIN: Procalcitonin: 2.67 ng/mL

## 2020-10-16 LAB — MAGNESIUM: Magnesium: 2.8 mg/dL — ABNORMAL HIGH (ref 1.7–2.4)

## 2020-10-16 LAB — D-DIMER, QUANTITATIVE: D-Dimer, Quant: 2.3 ug/mL-FEU — ABNORMAL HIGH (ref 0.00–0.50)

## 2020-10-16 LAB — PROTIME-INR
INR: 1.7 — ABNORMAL HIGH (ref 0.8–1.2)
Prothrombin Time: 19.3 seconds — ABNORMAL HIGH (ref 11.4–15.2)

## 2020-10-16 LAB — APTT: aPTT: 32 seconds (ref 24–36)

## 2020-10-16 MED ORDER — SODIUM CHLORIDE 0.9% FLUSH: 10.0000 mL | INTRAVENOUS | Status: DC | PRN

## 2020-10-16 MED ORDER — POTASSIUM CL IN DEXTROSE 5% 20 MEQ/L IV SOLN
20.0000 meq | INTRAVENOUS | Status: DC
  Administered 2020-10-16 (×2): 20 meq via INTRAVENOUS
  Filled 2020-10-16 (×3): qty 1000

## 2020-10-16 MED ORDER — CHLORHEXIDINE GLUCONATE CLOTH 2 % EX PADS
6.0000 | MEDICATED_PAD | Freq: Every day | CUTANEOUS | Status: DC
  Administered 2020-10-16 – 2020-10-18 (×2): 6 via TOPICAL

## 2020-10-16 MED ORDER — HEPARIN (PORCINE) 25000 UT/250ML-% IV SOLN
850.0000 [IU]/h | INTRAVENOUS | Status: DC
  Administered 2020-10-16: 850 [IU]/h via INTRAVENOUS
  Filled 2020-10-16: qty 250

## 2020-10-16 MED ORDER — SODIUM CHLORIDE 0.9 % IV SOLN
2.0000 g | INTRAVENOUS | Status: DC
  Administered 2020-10-16: 2 g via INTRAVENOUS
  Filled 2020-10-16: qty 0.28

## 2020-10-16 MED ORDER — HEPARIN BOLUS VIA INFUSION
3000.0000 [IU] | Freq: Once | INTRAVENOUS | Status: AC
  Administered 2020-10-16: 3000 [IU] via INTRAVENOUS
  Filled 2020-10-16: qty 3000

## 2020-10-16 MED ORDER — HEPARIN BOLUS VIA INFUSION
2000.0000 [IU] | Freq: Once | INTRAVENOUS | Status: AC
  Administered 2020-10-16: 2000 [IU] via INTRAVENOUS
  Filled 2020-10-16: qty 2000

## 2020-10-16 MED ORDER — HEPARIN (PORCINE) 25000 UT/250ML-% IV SOLN
1050.0000 [IU]/h | INTRAVENOUS | Status: DC
  Administered 2020-10-17: 1050 [IU]/h via INTRAVENOUS
  Filled 2020-10-16: qty 250

## 2020-10-16 NOTE — Progress Notes (Signed)
[place] for Heparin  Indication: pulmonary embolus  Allergies  Allergen Reactions  . Ace Inhibitors Other (See Comments)    "Allergic," per paperwork from [facility]  . Lisinopril Other (See Comments)    "Allergic," per paperwork from [facility]  . Zocor [Simvastatin] Other (See Comments)    Muscle pain    Patient Measurements: Height: 6' (182.9 cm) Weight: 55 kg (121 lb 4.1 oz) IBW/kg (Calculated) : 77.6 Heparin Dosing Weight: 55 kg  Vital Signs: Temp: 97.5 F (36.4 C) (01/12 1455) Temp Source: Axillary (01/12 1455) BP: 122/73 (01/12 1300) Pulse Rate: 64 (01/12 1300)  Labs: Recent Labs    10/15/20 0732 10/15/20 1732 10/16/20 1040 10/16/20 2015  HGB 13.5 11.8* 11.6*  --   HCT 47.5 40.4 39.4  --   PLT 427* 296 306  --   APTT  --   --  32  --   LABPROT  --   --  19.3*  --   INR  --   --  1.7*  --   HEPARINUNFRC  --   --   --  0.15*  CREATININE 1.77* 1.66* 1.34*  --     Estimated Creatinine Clearance: 34.8 mL/min (A) (by C-G formula based on SCr of 1.34 mg/dL (H)).    Assessment: Patient is a 80 y/o M with a h/o Alzheimer's disease admitted with sepsis due to COVID infection. Heparin drip ordered for presumed PE after VQ scan revealed perfusion defects though poor study as perfusion only scan. Patient received a dose of Lovenox last PM.  10/16/2020 First heparin level 0.15 - below goal after 3000 unit bolus & drip at 850 units/hr No bleeding reported  Goal of Therapy:  Heparin level 0.3-0.7 units/ml Monitor platelets by anticoagulation protocol: Yes   Plan:  Give 2000 unit heparin bolus Increase heparin drip to 1050 units/hr and check 8 hr heparin level Daily heparin level & CBC  Eudelia Bunch, Pharm.D 10/16/2020 9:10 PM

## 2020-10-16 NOTE — Progress Notes (Signed)
ANTICOAGULATION CONSULT NOTE - Initial Consult  Pharmacy Consult for Heparin  Indication: pulmonary embolus  Allergies  Allergen Reactions  . Ace Inhibitors Other (See Comments)    "Allergic," per paperwork from [facility]  . Lisinopril Other (See Comments)    "Allergic," per paperwork from [facility]  . Zocor [Simvastatin] Other (See Comments)    Muscle pain    Patient Measurements: Height: 6' (182.9 cm) Weight: 55 kg (121 lb 4.1 oz) IBW/kg (Calculated) : 77.6 Heparin Dosing Weight: 55 kg  Vital Signs: Temp: 97.5 F (36.4 C) (01/12 0100) Temp Source: Axillary (01/12 0100) BP: 128/84 (01/12 0800) Pulse Rate: 89 (01/12 0528)  Labs: Recent Labs    10/15/20 0732 10/15/20 1732  HGB 13.5 11.8*  HCT 47.5 40.4  PLT 427* 296  CREATININE 1.77* 1.66*    Estimated Creatinine Clearance: 28.1 mL/min (A) (by C-G formula based on SCr of 1.66 mg/dL (H)).   Medical History: Past Medical History:  Diagnosis Date  . Alzheimer's disease ([place])   . CAD (coronary artery disease)    Catheterization 2006 LAD occluded, OM 95% stenosis followed by mid occlusion, first obtuse marginal occluded, right coronary artery diffuse moderate disease. LIMA to the LAD was patent. Saphenous vein to PDA and posterior lateral patent with diffuse luminal irregularities, saphenous vein graft to second obtuse marginal is patent, saphenous vein graft to the first obtuse marginal had 25% stenosis.  . Esophageal reflux   . Esophageal stricture   . Other and unspecified hyperlipidemia   . Other dysphagia   . Rheumatoid arthritis(714.0)   . Status post dilation of esophageal narrowing   . Unspecified essential hypertension     Assessment: Patient is a 80 y/o M with a h/o Alzheimer's disease admitted with sepsis due to COVID infection. Heparin drip ordered for presumed PE after VQ scan revealed perfusion defects though poor study as perfusion only scan. Patient received a dose of Lovenox last PM.   Goal  of Therapy:  Heparin level 0.3-0.7 units/ml Monitor platelets by anticoagulation protocol: Yes   Plan:  Give 3000 units bolus x 1 Start heparin infusion at 850 units/hr Check anti-Xa level in 8 hours and daily while on heparin Continue to monitor H&H and platelets  Ulice Dash D 10/16/2020,8:45 AM

## 2020-10-16 NOTE — Progress Notes (Signed)
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 01/15/2020-colonic ileus-DNR/DNI  At baseline is not responsive-daughter noted patient was hypoxic-came to emergency room 1/11 and was on 15 L nonrebreather  Was positive for coronavirus 10/12/2020  Decompensated and worsened over this time from 1/8--1/11, he has been at [facility]  3 yrs ~ 4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 Coronavirus 19 infection-unvaccinated a. Initially on 15 L nonrebreather currently minor oxygen requirements 2 to 3 L currently b. Inflammatory markers stable trending since admission c. Started on admission 1/11 remdesivir Solu-Medrol and baricitinib d. Prone 16 hours a day as possible I-S, flutter valve 2. Possible superimposed pneumonia?   a. Stopped vancomycin as PCR is negative b. Continue cefepime and Flagyl c. Procalcitonin to guide therapy in am and will de-escalate based on that 3. Pulmonary embolism based on Q scan a. Perfusion scan shows moderate probability for pulmonary embolus precipitated from cytokine cascade and inflammation- b. Start VTE treatment expect 3 months at least 4. Severe hypernatremia on admission-155 on admission a. Sodium 161 over the last several checks b. Changed half-normal to D5 with 20 of K  and replace c. Check labs every 8 hours for 3 checks 5. AKI with metabolic acidosis on admission a. On admission severely volume depleted BUN/creatinine 50/1.7 b. Continue IV fluid as above-acidemia slightly improved c. Acidosis seems to be clearing 6. CABG 1996 a. Holding at this time any nephrotoxins-does not appear to be on beta-blockade or ACE inhibitor from prior to admission 7. Prior dysphagia and stricture with colonic ileus  a. Given that the patient is somnolent at this time hold feeds b. Will readdress and discuss feasibility of diet in the next several days and engage speech therapy if needed-is on a dysphagia diet 8. Rheumatoid arthritis a. Reported history of but no current treatment on prior to admission medications  DVT prophylaxis: Heparin Code Status: DNR Family Communication: discussed with Lawerance Bach [phone] Disposition:  Status is: Inpatient  Remains inpatient appropriate because:Persistent severe electrolyte disturbances, Altered mental status, Ongoing diagnostic testing needed not appropriate for outpatient work up and Unsafe d/c plan   Dispo: The patient is from: Home              Anticipated d/c is to: SNF              Anticipated d/c date is: > 3 days              Patient currently is not medically stable to d/c.       Consultants:   None currently  Procedures: None  Antimicrobials: Cefepime and Flagyl   Subjective: Patient is minimally arousable he opens his eyes some but then goes right back to sleep--this seems to be his premorbid baseline  Objective: Vitals:   10/15/20 1800 10/15/20 2100 10/16/20 0100 10/16/20 0528  BP:  131/90 134/79 136/84  Pulse:  97 91 89  Resp:  (!) 21 (!) 22 16  Temp: 98.1 F (36.7 C) 98.1 F (36.7 C) (!) 97.5 F (36.4 C)   TempSrc: Axillary Axillary Axillary   SpO2:  95% 93% 97%  Weight: 55 kg     Height: 6' (1.829 m)       Intake/Output Summary (Last 24 hours) at 10/16/2020 0800 Last data filed at  10/16/2020 6301 Gross per 24 hour  Intake 3002.3 ml  Output 550 ml  Net 2452.3 ml   Filed Weights   10/15/20 1800  Weight: 55 kg    Examination:  Frail appearing African-American male opens eyes briefly to strong stimulation and rouses slightly but then falls right back to sleep pupils are about 3 mm GCS of about 11/15 EOMI NCAT no focal deficit Chest clear Distended abdomen without rebound or guarding No lower extremity edema Neurologic exam he withdraws to a strong stimulus but cannot independently move and does not follow commands  Data Reviewed: I have personally reviewed following labs and imaging studies  Sodium 161-->162 Chloride >130 BUN/creatinine 52/1.3 Phosphorus 1.7 magnesium 2.8 White count 18.7 Hemoglobin 11.6 Platelet 306  COVID-19 Labs  Recent Labs    10/15/20 0732  DDIMER 2.11*  FERRITIN 195  LDH 177  CRP 19.5*    Lab Results  Component Value Date   SARSCOV2NAA POSITIVE (A) 10/12/2020   [place] NEGATIVE 05/13/2020   [place] NEGATIVE 01/19/2020   [place] NEGATIVE 05/02/2019     Radiology Studies: NM PULMONARY VENT AND PERF (V/Q Scan)  Result Date: 10/15/2020 CLINICAL DATA:  Recent diagnosis of COVID-19 infection with worsening hypoxia. Elevated D-dimer. Evaluate for pulmonary embolism. EXAM: NUCLEAR MEDICINE PERFUSION LUNG SCAN TECHNIQUE: Perfusion images were obtained in multiple projections after intravenous injection of radiopharmaceutical. Ventilation scans intentionally deferred if perfusion scan and chest x-ray adequate for interpretation during COVID 19 epidemic. RADIOPHARMACEUTICALS:  4.4 mCi Tc-89m MAA IV COMPARISON:  Chest radiograph-earlier same day; 10/12/2020; CT abdomen and pelvis-01/04/2020 FINDINGS: Review of chest radiograph performed earlier same day demonstrates unchanged cardiac silhouette and mediastinal contours post median sternotomy and CABG. Minimal right infrahilar opacities, potentially representative of  atelectasis. No pleural effusion or pneumothorax. Redemonstrated massive gaseous distension of the colon beneath the bilateral hemidiaphragms. Perfusion images demonstrate mottled appearance of the bilateral pulmonary parenchyma with apparent wedge-shaped defects involving the peripheral aspect of the right mid lung and basilar aspect of the left lower lung which are without radiographic correlate and thus worrisome for of areas of non perfusion due to pulmonary embolism though examination degraded secondary to lack of ventilatory images. IMPRESSION: Indeterminate though worrisome perfusion only nuclear medicine lung scan. Findings worrisome for pulmonary embolism with geographic wedge-shaped areas of non perfusion involving the peripheral aspect of the right mid lung and left lung base, incompletely evaluated due to lack of ventilatory images. If the diagnosis of pulmonary embolism remains indeterminate clinically, further evaluation with bilateral lower extremity venous Doppler ultrasound and/or contrast-enhanced chest CT could be performed as indicated. Electronically Signed   By: Sandi Mariscal M.D.   On: 10/15/2020 16:51   DG Chest Port 1 View  Result Date: 10/15/2020 CLINICAL DATA:  79 year old male with hypoxia, COVID positive. EXAM: PORTABLE CHEST 1 VIEW COMPARISON:  10/12/2020 FINDINGS: The heart size and mediastinal contours are within normal limits. Similar appearing postsurgical changes after CABG. Both lungs are clear. Similar appearing massively dilated loops of bowel in the bilateral upper abdomen. Atherosclerotic calcification of the aortic arch. No acute osseous abnormality. IMPRESSION: No  acute cardiopulmonary process. Unchanged massively dilated loops of bowel in the bilateral upper quadrants. Electronically Signed   By: Ruthann Cancer MD   On: 10/15/2020 07:56     Scheduled Meds: . vitamin C  500 mg Oral Daily  . baricitinib  2 mg Oral Daily  . bisacodyl  10 mg Rectal Daily  . brimonidine   1 drop Left Eye Q12H   And  . timolol  1 drop Left Eye Q12H  . brinzolamide  1 drop Left Eye TID  . enoxaparin (LOVENOX) injection  30 mg Subcutaneous Q24H  . Ipratropium-Albuterol  1 puff Inhalation Q6H  . methylPREDNISolone (SOLU-MEDROL) injection  1 mg/kg Intravenous Q12H   Followed by  . [START ON 10/19/2020] predniSONE  50 mg Oral Daily  . Netarsudil-Latanoprost   Both Eyes Daily  . zinc sulfate  220 mg Oral Daily   Continuous Infusions: . 0.45 % NaCl with KCl 20 mEq / L 75 mL/hr at 10/16/20 0654  . ceFEPime (MAXIPIME) IV Stopped (10/15/20 2217)  . remdesivir 100 mg in NS 100 mL    . vancomycin Stopped (10/16/20 0013)     LOS: 1 day    Time spent: [place], MD [facility] To contact the attending provider between 7A-7P or the covering provider during after hours 7P-7A, please log into the web site [email or web address] and access using universal [place] password for that web site. If you do not have the password, please call the hospital operator.  10/16/2020, 8:00 AM

## 2020-10-16 NOTE — Progress Notes (Signed)
Pt has not had any urine output this shift. Straight cath was performed per provider's order. 597ml of clear amber urine was removed from pt bladder.

## 2020-10-16 NOTE — Progress Notes (Signed)
PHARMACY NOTE:  ANTIMICROBIAL RENAL DOSAGE ADJUSTMENT  Current antimicrobial regimen includes a mismatch between antimicrobial dosage and estimated renal function.  As per policy approved by the Pharmacy & Therapeutics and Medical Executive Committees, the antimicrobial dosage will be adjusted accordingly.  Current antimicrobial dosage:  Cefepime 2 g iv q 12 h  Indication: pna  Renal Function:  Estimated Creatinine Clearance: 28.1 mL/min (A) (by C-G formula based on SCr of 1.66 mg/dL (H)). []      On intermittent HD, scheduled: []      On CRRT    Antimicrobial dosage has been changed to:  Cefepime 2 g iv q 24h  Additional comments:   Thank you for allowing pharmacy to be a part of this patient's care.  Napoleon Form, [facility] 10/16/2020 9:57 AM

## 2020-10-16 NOTE — Plan of Care (Signed)
  Problem: Education: Goal: Knowledge of General Education information will improve Description: Including pain rating scale, medication(s)/side effects and non-pharmacologic comfort measures Outcome: Progressing   Problem: Elimination: Goal: Will not experience complications related to bowel motility Outcome: Progressing   Problem: Safety: Goal: Ability to remain free from injury will improve Outcome: Progressing   Problem: Skin Integrity: Goal: Risk for impaired skin integrity will decrease Outcome: Progressing   Problem: Education: Goal: Knowledge of risk factors and measures for prevention of condition will improve Outcome: Progressing   Problem: Coping: Goal: Psychosocial and spiritual needs will be supported Outcome: Progressing   Problem: Respiratory: Goal: Will maintain a patent airway Outcome: Progressing Goal: Complications related to the disease process, condition or treatment will be avoided or minimized Outcome: Progressing

## 2020-10-17 ENCOUNTER — Inpatient Hospital Stay (HOSPITAL_COMMUNITY): Payer: Medicare (Managed Care)

## 2020-10-17 DIAGNOSIS — U071 COVID-19: Secondary | ICD-10-CM | POA: Diagnosis not present

## 2020-10-17 LAB — MAGNESIUM: Magnesium: 2.5 mg/dL — ABNORMAL HIGH (ref 1.7–2.4)

## 2020-10-17 LAB — CULTURE, BLOOD (ROUTINE X 2)
Culture: NO GROWTH
Culture: NO GROWTH
Special Requests: ADEQUATE
Special Requests: ADEQUATE

## 2020-10-17 LAB — CBC WITH DIFFERENTIAL/PLATELET
Abs Immature Granulocytes: 0.14 10*3/uL — ABNORMAL HIGH (ref 0.00–0.07)
Basophils Absolute: 0 10*3/uL (ref 0.0–0.1)
Basophils Relative: 0 %
Eosinophils Absolute: 0 10*3/uL (ref 0.0–0.5)
Eosinophils Relative: 0 %
HCT: 34.2 % — ABNORMAL LOW (ref 39.0–52.0)
Hemoglobin: 10.1 g/dL — ABNORMAL LOW (ref 13.0–17.0)
Immature Granulocytes: 1 %
Lymphocytes Relative: 3 %
Lymphs Abs: 0.4 10*3/uL — ABNORMAL LOW (ref 0.7–4.0)
MCH: 20.4 pg — ABNORMAL LOW (ref 26.0–34.0)
MCHC: 29.5 g/dL — ABNORMAL LOW (ref 30.0–36.0)
MCV: 69.2 fL — ABNORMAL LOW (ref 80.0–100.0)
Monocytes Absolute: 0.3 10*3/uL (ref 0.1–1.0)
Monocytes Relative: 2 %
Neutro Abs: 12.9 10*3/uL — ABNORMAL HIGH (ref 1.7–7.7)
Neutrophils Relative %: 94 %
Platelets: 257 10*3/uL (ref 150–400)
RBC: 4.94 MIL/uL (ref 4.22–5.81)
RDW: 23.7 % — ABNORMAL HIGH (ref 11.5–15.5)
WBC: 13.8 10*3/uL — ABNORMAL HIGH (ref 4.0–10.5)
nRBC: 0.1 % (ref 0.0–0.2)

## 2020-10-17 LAB — BASIC METABOLIC PANEL
Anion gap: 11 (ref 5–15)
BUN: 46 mg/dL — ABNORMAL HIGH (ref 8–23)
CO2: 18 mmol/L — ABNORMAL LOW (ref 22–32)
Calcium: 8.3 mg/dL — ABNORMAL LOW (ref 8.9–10.3)
Chloride: 123 mmol/L — ABNORMAL HIGH (ref 98–111)
Creatinine, Ser: 1.15 mg/dL (ref 0.61–1.24)
GFR, Estimated: >60 mL/min (ref >60)
Glucose, Bld: 385 mg/dL — ABNORMAL HIGH (ref 70–99)
Potassium: 4.3 mmol/L (ref 3.5–5.1)
Sodium: 152 mmol/L — ABNORMAL HIGH (ref 135–145)

## 2020-10-17 LAB — PROCALCITONIN: Procalcitonin: 1.62 ng/mL

## 2020-10-17 LAB — FERRITIN: Ferritin: 134 ng/mL (ref 24–336)

## 2020-10-17 LAB — D-DIMER, QUANTITATIVE: D-Dimer, Quant: 1.81 ug/mL-FEU — ABNORMAL HIGH (ref 0.00–0.50)

## 2020-10-17 LAB — PHOSPHORUS: Phosphorus: 2.2 mg/dL — ABNORMAL LOW (ref 2.5–4.6)

## 2020-10-17 LAB — C-REACTIVE PROTEIN: CRP: 11.5 mg/dL — ABNORMAL HIGH (ref <1.0)

## 2020-10-17 MED ORDER — SODIUM CHLORIDE 0.9 % IV SOLN
2.0000 g | Freq: Two times a day (BID) | INTRAVENOUS | Status: DC
  Administered 2020-10-17 (×2): 2 g via INTRAVENOUS
  Filled 2020-10-17 (×3): qty 2

## 2020-10-17 MED ORDER — IPRATROPIUM-ALBUTEROL 20-100 MCG/ACT IN AERS
1.0000 | INHALATION_SPRAY | Freq: Four times a day (QID) | RESPIRATORY_TRACT | Status: DC | PRN
  Filled 2020-10-17: qty 4

## 2020-10-17 MED ORDER — APIXABAN 5 MG PO TABS: 5.0000 mg | ORAL_TABLET | Freq: Two times a day (BID) | ORAL | Status: DC

## 2020-10-17 MED ORDER — DEXTROSE 5 % IV SOLN: INTRAVENOUS | Status: DC

## 2020-10-17 MED ORDER — APIXABAN 5 MG PO TABS
10.0000 mg | ORAL_TABLET | Freq: Two times a day (BID) | ORAL | Status: DC
  Administered 2020-10-17 – 2020-10-18 (×3): 10 mg via ORAL
  Filled 2020-10-17 (×3): qty 2

## 2020-10-17 MED ORDER — BARICITINIB 2 MG PO TABS
4.0000 mg | ORAL_TABLET | Freq: Every day | ORAL | Status: DC
  Administered 2020-10-18: 4 mg via ORAL
  Filled 2020-10-17 (×2): qty 2

## 2020-10-17 NOTE — Progress Notes (Signed)
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 01/15/2020-colonic ileus-DNR/DNI  At baseline is not responsive-daughter noted patient was hypoxic-came to emergency room 1/11 and was on 15 L nonrebreather  Was positive for coronavirus 10/12/2020  Decompensated and worsened over this time from 1/8--1/11, he has been at [facility]  3 yrs ~ 4 yr had dementia he is total care at [facility]--he usually doesn't open his eyes, he cannot see typically and is minimally interactive  Initial labs obtained show sodium 161, potassium 3.2 BUN/creatinine 50/1.77 Inflammatory markers elevated White count 15.0, hemoglobin 11.8 Perfusion scan performed 1/11 showing probable pulmonary embolism   Assessment & Plan:   Active Problems:   Pneumonia due to COVID-19 virus   COVID-19   1. Coronavirus 19 infection-unvaccinated a. currently minor oxygen requirements 2 to 3 L currently b. Inflammatory markers stable trending since admission---1/11 remdesivir/ Solu-Medrol /and baricitinib c. Prone 16 hours a day as possible I-S, flutter valve 2. Possible superimposed pneumonia?   a. Stopped vancomycin as PCR is negative b. Procalcitonin down, transition to Levaquin to complete total 5 days on 10/19/2020 3. Pulmonary embolism based on Q scan a. Perfusion scan shows moderate probability for pulmonary embolus precipitated from cytokine cascade and inflammation- b. Was on heparin transition to Eliquis 4. Severe hypernatremia on admission-155 on admission a. Improving slowly b. Changed half-normal to D5 and discontinue potassium and fluid c. Discontinue frequent checks now rechecking sodium daily  5. AKI with metabolic  acidosis on admission a. On admission severely volume depleted BUN/creatinine 50/1.7 b. improving steadily with IV fluid 6. CABG 1996 a. Holding at this time any nephrotoxins-does not appear to be on beta-blockade or ACE inhibitor from prior to admission 7. Prior dysphagia and stricture with colonic ileus  a. Given that the patient is somnolent at this time hold feeds b. Placed on clear liquid diet c. Poor candidate for decompression given somnolence and certainly not really a candidate for surgery may ask GI opinion however 8. Rheumatoid arthritis a. Reported history of but no current treatment on prior to admission medications  DVT prophylaxis: Heparin Code Status: DNR Family Communication: discussed with daughter Lawerance Bach [phone] Disposition:  Status is: Inpatient-he has not improved mentally and this may be his baseline he has significant distention of abdomen-in the past he has been told that he is not a candidate for colonic surgery or decompression by GI I had a long and [name] discussion with his daughter Cassandria Santee on the telephone 1/13 and told her that previously 01/2020 when he had Ogilvie's/colonic ileus he was not a candidate for decompression-at this time he certainly is not a candidate for decompression either especially as he has COVID in addition to other issues-I have mentioned to her that he may need to return to [facility] facility and the hospice/palliative care could follow  Remains inpatient appropriate because:Persistent severe electrolyte disturbances, Altered mental status, Ongoing diagnostic testing needed not appropriate for outpatient work up and Unsafe d/c plan  Dispo: The patient is from: SNF              Anticipated d/c is to: SNF              Anticipated d/c date is: 1 day  Patient currently is not medically stable to d/c.    Consultants:   None currently  Procedures: None  Antimicrobials: Cefepime and Flagyl-->Levaquin  p.o.   Subjective:  Minimally responsive-unclear if he has passed a stool or not per RN cannot elicit any further Nursing tells me midline is not working  Objective: Vitals:   10/17/20 0200 10/17/20 0300 10/17/20 0600 10/17/20 0632  BP: 136/78  (!) 143/71   Pulse: 60  61   Resp: (!) 9 17 19    Temp:    97.8 F (36.6 C)  TempSrc:   Axillary Axillary  SpO2: 100%  94%   Weight:      Height:        Intake/Output Summary (Last 24 hours) at 10/17/2020 0916 Last data filed at 10/17/2020 0254 Gross per 24 hour  Intake 1681.75 ml  Output 1050 ml  Net 631.75 ml   Filed Weights   10/15/20 1800  Weight: 55 kg    Examination:  Minimally responsive awakens minimally when I agitate him CTA B no added sound S1-S2 no murmur-on monitors sinus bradycardia with PVC Abdomen is massively distended with high-pitched bowel sounds compared No lower extremity edema Cannot assess neuro properly moves to agitation but otherwise does not No lower extremity edema minimally responsive Unclear if it is hard to stool or not   Data Reviewed: I have personally reviewed following labs and imaging studies  Sodium 161-->162-->152 Chloride >130-->123 BUN/creatinine 52/1.3-->46/1.1 White count 18.7-->13.8 Procalcitonin 2.6-->1.6 X-ray?  Ileus  COVID-19 Labs  Recent Labs    10/15/20 0732 10/16/20 1040 10/17/20 0400  DDIMER 2.11* 2.30* 1.81*  FERRITIN 195 164 134  LDH 177  --   --   CRP 19.5* 19.0* 11.5*    Lab Results  Component Value Date   SARSCOV2NAA POSITIVE (A) 10/12/2020   [place] NEGATIVE 05/13/2020   [place] NEGATIVE 01/19/2020   [place] NEGATIVE 05/02/2019     Radiology Studies: DG Abd 1 View  Result Date: 10/17/2020 CLINICAL DATA:  Ileus EXAM: ABDOMEN - 1 VIEW COMPARISON:  Numerous priors, most recent abdominal radiography 05/13/2020, chest radiograph 10/15/2020 FINDINGS: Massive distention of the bowel throughout the abdomen, appearance is quite similar to  most recent chest radiography as well as more remote comparison abdominal radiography throughout 2020 and 2021. No suspicious abdominal calcifications. Vascular calcium seen in the abdomen and pelvis. Prior sternotomy and CABG. IMPRESSION: Massive distention of the bowel throughout the abdomen, appearance is quite similar to most recent chest radiography as well as more remote comparison abdominal radiography throughout 2020 and 2021. Can reflect a chronic ileus or dysmotility. Electronically Signed   By: Lovena Le M.D.   On: 10/17/2020 06:04   NM PULMONARY VENT AND PERF (V/Q Scan)  Result Date: 10/15/2020 CLINICAL DATA:  Recent diagnosis of COVID-19 infection with worsening hypoxia. Elevated D-dimer. Evaluate for pulmonary embolism. EXAM: NUCLEAR MEDICINE PERFUSION LUNG SCAN TECHNIQUE: Perfusion images were obtained in multiple projections after intravenous injection of radiopharmaceutical. Ventilation scans intentionally deferred if perfusion scan and chest x-ray adequate for interpretation during COVID 19 epidemic. RADIOPHARMACEUTICALS:  4.4 mCi Tc-28m MAA IV COMPARISON:  Chest radiograph-earlier same day; 10/12/2020; CT abdomen and pelvis-01/04/2020 FINDINGS: Review of chest radiograph performed earlier same day demonstrates unchanged cardiac silhouette and mediastinal contours post median sternotomy and CABG. Minimal right infrahilar opacities, potentially representative of atelectasis. No pleural effusion or pneumothorax. Redemonstrated massive gaseous distension of the colon beneath the bilateral hemidiaphragms. Perfusion images demonstrate mottled appearance of the bilateral pulmonary parenchyma with apparent  wedge-shaped defects involving the peripheral aspect of the right mid lung and basilar aspect of the left lower lung which are without radiographic correlate and thus worrisome for of areas of non perfusion due to pulmonary embolism though examination degraded secondary to lack of ventilatory  images. IMPRESSION: Indeterminate though worrisome perfusion only nuclear medicine lung scan. Findings worrisome for pulmonary embolism with geographic wedge-shaped areas of non perfusion involving the peripheral aspect of the right mid lung and left lung base, incompletely evaluated due to lack of ventilatory images. If the diagnosis of pulmonary embolism remains indeterminate clinically, further evaluation with bilateral lower extremity venous Doppler ultrasound and/or contrast-enhanced chest CT could be performed as indicated. Electronically Signed   By: Sandi Mariscal M.D.   On: 10/15/2020 16:51   Scheduled Meds: . vitamin C  500 mg Oral Daily  . baricitinib  2 mg Oral Daily  . bisacodyl  10 mg Rectal Daily  . brimonidine  1 drop Left Eye Q12H   And  . timolol  1 drop Left Eye Q12H  . brinzolamide  1 drop Left Eye TID  . Chlorhexidine Gluconate Cloth  6 each Topical Daily  . Ipratropium-Albuterol  1 puff Inhalation Q6H  . methylPREDNISolone (SOLU-MEDROL) injection  1 mg/kg Intravenous Q12H   Followed by  . [START ON 10/19/2020] predniSONE  50 mg Oral Daily  . Netarsudil-Latanoprost   Both Eyes Daily  . zinc sulfate  220 mg Oral Daily   Continuous Infusions: . ceFEPime (MAXIPIME) IV 2 g (10/16/20 2210)  . dextrose 5 % with KCl 20 mEq / L 20 mEq (10/16/20 2155)  . heparin 1,050 Units/hr (10/17/20 0439)  . remdesivir 100 mg in NS 100 mL 100 mg (10/16/20 1100)    LOS: 2 days   Time spent: [place], MD [facility] To contact the attending provider between 7A-7P or the covering provider during after hours 7P-7A, please log into the web site [email or web address] and access using universal [place] password for that web site. If you do not have the password, please call the hospital operator.  10/17/2020, 9:16 AM

## 2020-10-17 NOTE — Progress Notes (Addendum)
Pharmacy Antibiotic Note  Justin Griffin is a 80 y.o. male admitted on 10/15/2020 with sepsis d/t COVID.  Pharmacy has been consulted for vancomycin and cefepime dosing. Vanc, cefepime, Flagyl given x 1 in ED  Today, 10/17/2020:  D3 abx  WBC peaked 1/12, trending down  Remains afebrile  SCr continues to improve  Plan:  Increase Cefepime back to 2 g IV q12 hr with improved renal function  Pharmacy will sign off, following peripherally for renal adjustments and culture results  Height: 6' (182.9 cm) Weight: 55 kg (121 lb 4.1 oz) IBW/kg (Calculated) : 77.6  Temp (24hrs), Avg:97.9 F (36.6 C), Min:97.5 F (36.4 C), Max:98.4 F (36.9 C)  Recent Labs  Lab 10/12/20 1741 10/12/20 1941 10/15/20 0732 10/15/20 1309 10/15/20 1732 10/16/20 1040 10/16/20 2015 10/17/20 0400  WBC 7.2  --  13.7*  --  15.0* 18.7*  --  13.8*  CREATININE 0.89  --  1.77*  --  1.66* 1.34* 1.20 1.15  LATICACIDVEN 1.4 1.8 4.2* 5.2*  --   --   --   --     Estimated Creatinine Clearance: 40.5 mL/min (by C-G formula based on SCr of 1.15 mg/dL).    Allergies  Allergen Reactions  . Ace Inhibitors Other (See Comments)    "Allergic," per paperwork from [facility]  . Lisinopril Other (See Comments)    "Allergic," per paperwork from [facility]  . Zocor [Simvastatin] Other (See Comments)    Muscle pain     Thank you for allowing pharmacy to be a part of this patient's care.  Keni Wafer A 10/17/2020 9:40 AM

## 2020-10-17 NOTE — Plan of Care (Signed)
  Problem: Education: Goal: Knowledge of General Education information will improve Description: Including pain rating scale, medication(s)/side effects and non-pharmacologic comfort measures Outcome: Progressing   Problem: Safety: Goal: Ability to remain free from injury will improve Outcome: Progressing   Problem: Skin Integrity: Goal: Risk for impaired skin integrity will decrease Outcome: Progressing   

## 2020-10-17 NOTE — Progress Notes (Signed)
IVT consult placed to evaluate midline for labs.  Evaluated, flushed twice w/pulsating movement of normal saline.  Midline would only return 2cc's of blood total.  1cc returned well, following 1cc was extremely sluggish.  Called out for RN to come observe; however she was unavailable.  Advised another RN on floor regarding my findings.

## 2020-10-18 DIAGNOSIS — U071 COVID-19: Secondary | ICD-10-CM | POA: Diagnosis not present

## 2020-10-18 LAB — CBC WITH DIFFERENTIAL/PLATELET
Abs Immature Granulocytes: 0.05 10*3/uL (ref 0.00–0.07)
Basophils Absolute: 0 10*3/uL (ref 0.0–0.1)
Basophils Relative: 0 %
Eosinophils Absolute: 0 10*3/uL (ref 0.0–0.5)
Eosinophils Relative: 0 %
HCT: 34.2 % — ABNORMAL LOW (ref 39.0–52.0)
Hemoglobin: 10.2 g/dL — ABNORMAL LOW (ref 13.0–17.0)
Immature Granulocytes: 1 %
Lymphocytes Relative: 7 %
Lymphs Abs: 0.6 10*3/uL — ABNORMAL LOW (ref 0.7–4.0)
MCH: 20.5 pg — ABNORMAL LOW (ref 26.0–34.0)
MCHC: 29.8 g/dL — ABNORMAL LOW (ref 30.0–36.0)
MCV: 68.7 fL — ABNORMAL LOW (ref 80.0–100.0)
Monocytes Absolute: 0.2 10*3/uL (ref 0.1–1.0)
Monocytes Relative: 2 %
Neutro Abs: 8.8 10*3/uL — ABNORMAL HIGH (ref 1.7–7.7)
Neutrophils Relative %: 90 %
Platelets: 265 10*3/uL (ref 150–400)
RBC: 4.98 MIL/uL (ref 4.22–5.81)
RDW: 23.8 % — ABNORMAL HIGH (ref 11.5–15.5)
WBC: 9.7 10*3/uL (ref 4.0–10.5)
nRBC: 0.3 % — ABNORMAL HIGH (ref 0.0–0.2)

## 2020-10-18 LAB — BASIC METABOLIC PANEL
Anion gap: 12 (ref 5–15)
BUN: 43 mg/dL — ABNORMAL HIGH (ref 8–23)
CO2: 19 mmol/L — ABNORMAL LOW (ref 22–32)
Calcium: 8.8 mg/dL — ABNORMAL LOW (ref 8.9–10.3)
Chloride: 125 mmol/L — ABNORMAL HIGH (ref 98–111)
Creatinine, Ser: 1.03 mg/dL (ref 0.61–1.24)
GFR, Estimated: >60 mL/min (ref >60)
Glucose, Bld: 166 mg/dL — ABNORMAL HIGH (ref 70–99)
Potassium: 3 mmol/L — ABNORMAL LOW (ref 3.5–5.1)
Sodium: 156 mmol/L — ABNORMAL HIGH (ref 135–145)

## 2020-10-18 LAB — FERRITIN: Ferritin: 143 ng/mL (ref 24–336)

## 2020-10-18 LAB — C-REACTIVE PROTEIN: CRP: 7.8 mg/dL — ABNORMAL HIGH (ref <1.0)

## 2020-10-18 LAB — D-DIMER, QUANTITATIVE: D-Dimer, Quant: 1.33 ug/mL-FEU — ABNORMAL HIGH (ref 0.00–0.50)

## 2020-10-18 LAB — PROCALCITONIN: Procalcitonin: 0.79 ng/mL

## 2020-10-18 LAB — PHOSPHORUS: Phosphorus: 2.5 mg/dL (ref 2.5–4.6)

## 2020-10-18 MED ORDER — LEVOFLOXACIN 500 MG PO TABS: 500.0000 mg | ORAL_TABLET | Freq: Every day | ORAL | 0 refills | Status: AC

## 2020-10-18 MED ORDER — APIXABAN 5 MG PO TABS: 10.0000 mg | ORAL_TABLET | Freq: Two times a day (BID) | ORAL | 0 refills | Status: AC

## 2020-10-18 MED ORDER — PREDNISONE 50 MG PO TABS: ORAL_TABLET | ORAL | 0 refills | Status: AC

## 2020-10-18 MED ORDER — POTASSIUM CHLORIDE CRYS ER 20 MEQ PO TBCR: 40.0000 meq | EXTENDED_RELEASE_TABLET | Freq: Two times a day (BID) | ORAL | 0 refills | Status: AC

## 2020-10-18 MED ORDER — APIXABAN 5 MG PO TABS: 5.0000 mg | ORAL_TABLET | Freq: Two times a day (BID) | ORAL | 3 refills | Status: AC

## 2020-10-18 MED ORDER — ACETAMINOPHEN 325 MG PO TABS: 650.0000 mg | ORAL_TABLET | Freq: Four times a day (QID) | ORAL | 0 refills | Status: AC | PRN

## 2020-10-18 NOTE — TOC Progression Note (Signed)
Transition of Care [facility]) - Progression Note    Patient Details  Name: Justin Griffin MRN: [id number] Date of Birth: [birth year]  Transition of [facility]) CM/SW Contact  Purcell Mouton, RN Phone Number: 10/18/2020, 9:44 AM  Clinical Narrative:    Spoke with [facility] concerning pt returning back to facility. Admission coordinator stated that [facility] will need to approve. A call was made on 10/17/20 to [facility]. [facility] SW was called this AM.  [facility] SW agreed with pt going back to [facility] and will call [employer].    Expected Discharge Plan: Skilled Nursing Facility Barriers to Discharge: No Barriers Identified  Expected Discharge Plan and Services Expected Discharge Plan: [place]         Expected Discharge Date: 10/18/20                                     Social Determinants of Health (SDOH) Interventions    Readmission Risk Interventions Readmission Risk Prevention Plan 03/20/2019  Transportation Screening Complete  PCP or Specialist Appt within 3-5 Days Complete  HRI or [place] Complete  Social Work Consult for [place] Planning/Counseling Complete  Palliative Care Screening Not Applicable  Medication Review [employer]) Complete  Some recent data might be hidden

## 2020-10-18 NOTE — Plan of Care (Signed)
  Problem: Safety: Goal: Ability to remain free from injury will improve Outcome: Progressing   Problem: Respiratory: Goal: Will maintain a patent airway Outcome: Progressing Goal: Complications related to the disease process, condition or treatment will be avoided or minimized Outcome: Progressing

## 2020-10-18 NOTE — TOC Progression Note (Signed)
Transition of Care [facility]) - Progression Note    Patient Details  Name: Justin Griffin MRN: [id number] Date of Birth: [birth year]  Transition of [facility]) CM/SW Contact  Purcell Mouton, RN Phone Number: 10/18/2020, 11:54 AM  Clinical Narrative:    Family is aware that pt will transport back to [facility]. Family was with Rise Paganini, NP at [facility].  Spoke with [place], SW at [facility] pt will go back to [facility]. Family was made aware that [facility] had a bed opening for COVID. [facility] is not taking COVID pt's at present time.     Expected Discharge Plan: Skilled Nursing Facility Barriers to Discharge: No Barriers Identified  Expected Discharge Plan and Services Expected Discharge Plan: [facility]         Expected Discharge Date: 10/18/20                                     Social Determinants of Health (SDOH) Interventions    Readmission Risk Interventions Readmission Risk Prevention Plan 03/20/2019  Transportation Screening Complete  PCP or Specialist Appt within 3-5 Days Complete  HRI or [place] Complete  Social Work Consult for [place] Planning/Counseling Complete  Palliative Care Screening Not Applicable  Medication Review Press photographer) Complete  Some recent data might be hidden

## 2020-10-18 NOTE — Progress Notes (Signed)
AVS given to [facility] for transport to [facility] and explained over the phone with the receiving nurse. Medications and follow up appointments have been explained with pt's nurse verbalizing understanding.

## 2020-10-18 NOTE — Progress Notes (Signed)
Patient did not have a bowel movement overnight.

## 2020-10-18 NOTE — NC FL2 (Signed)
[place] LEVEL OF CARE SCREENING TOOL     IDENTIFICATION  Patient Name: Justin Griffin Birthdate: December 27, 1940 Sex: male Admission Date (Current Location): 10/15/2020  [facility] and [place] Number:  Herbalist and Address:  [facility],  [place] [address], [place]      Provider Number: [id number]  Attending Physician Name and Address:  Nita Sells, MD  Relative Name and Phone Number:  Gaylon Bentz daughter [phone], [phone] Cell    Current Level of Care: Hospital Recommended Level of Care: [place] Prior Approval Number:    Date Approved/Denied:   PASRR Number: [id number]  Discharge Plan: SNF    Current Diagnoses: Patient Active Problem List   Diagnosis Date Noted  . Pneumonia due to COVID-19 virus 10/15/2020  . COVID-19 10/15/2020  . 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 Ileus ([place]) 01/14/2020  . Adynamic ileus ([place]) 03/21/2019  . Dementia without behavioral disturbance ([place])   . Goals of care, counseling/discussion   . Palliative care by specialist   . Pressure injury of skin 03/20/2019  . Abdominal distention   . Sepsis ([place]) 03/19/2019  . Acute hypernatremia 03/19/2019  . Hypokalemia 03/19/2019  . Anemia 03/19/2019  . Dehydration   . Delirium 06/11/2017  . Acute kidney injury ([place]) 06/11/2017  . Fever 06/11/2017  . Effusion of right knee 06/11/2017  . Inguinal hernia 12/24/2016  . Testicular pain, left 08/07/2016  . Hemorrhoid 08/07/2016  . 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[place]) 07/24/2016  . Routine general medical examination at a health care facility 01/27/2016  . Medicare annual wellness visit, subsequent 01/27/2016  . Essential hypertension, benign 08/08/2009  . GERD 07/04/2008  . RHEUMATOID ARTHRITIS 07/04/2008  . WEIGHT LOSS-ABNORMAL 07/04/2008  . DYSPHAGIA 07/04/2008  . TOBACCO USER 07/03/2008  . POLYP, COLON 02/04/2008  . DYSLIPIDEMIA 02/04/2008  .  Essential hypertension 02/04/2008  . Coronary artery disease 02/04/2008  . POLYPECTOMY, HX OF 02/04/2008  . CORONARY ARTERY BYPASS GRAFT, HX OF 02/04/2008    Orientation RESPIRATION BLADDER Height & Weight     Self,Time,Situation,Place  O2 (2L O2) Incontinent,Indwelling catheter Weight: 55 kg Height:  6' (182.9 cm)  BEHAVIORAL SYMPTOMS/MOOD NEUROLOGICAL BOWEL NUTRITION STATUS      Incontinent Diet (Dyshagia, Puree thin liquid)  AMBULATORY STATUS COMMUNICATION OF NEEDS Skin   Extensive Assist Verbally PU Stage and Appropriate Care   PU Stage 2 Dressing: Daily                   Personal Care Assistance Level of Assistance  Bathing,Feeding,Dressing Bathing Assistance: Maximum assistance Feeding assistance: Maximum assistance Dressing Assistance: Maximum assistance     Functional Limitations Info  Sight,Hearing,Speech Sight Info: Adequate Hearing Info: Adequate Speech Info: Impaired    SPECIAL CARE FACTORS FREQUENCY  PT (By licensed PT),OT (By licensed OT)     PT Frequency: 5x week OT Frequency: 5x week            Contractures Contractures Info: Not present    Additional Factors Info  Code Status,Allergies (DNR) Code Status Info: DNR Allergies Info: Ace Inhibitors, Lisinopril, Zocor Simvastatin           Current Medications (10/18/2020):  This is the current hospital active medication list Current Facility-Administered Medications  Medication Dose Route Frequency Provider Last Rate Last Admin  . acetaminophen (TYLENOL) tablet 650 mg  650 mg Oral Q6H PRN Marylyn Ishihara, Tyrone A, DO       Or  . acetaminophen (TYLENOL) suppository  650 mg  650 mg Rectal Q6H PRN Marylyn Ishihara, Tyrone A, DO      . apixaban (ELIQUIS) tablet 10 mg  10 mg Oral BID Nita Sells, MD   10 mg at 10/17/20 2232   Followed by  . [START ON 10/24/2020] apixaban (ELIQUIS) tablet 5 mg  5 mg Oral BID Nita Sells, MD      . ascorbic acid (VITAMIN C) tablet 500 mg  500 mg Oral Daily Kyle, Tyrone  A, DO   500 mg at 10/17/20 1056  . baricitinib (OLUMIANT) tablet 4 mg  4 mg Oral Daily Polly Cobia, RPH      . bisacodyl (DULCOLAX) suppository 10 mg  10 mg Rectal Daily Marylyn Ishihara, Tyrone A, DO   10 mg at 10/17/20 1804  . brimonidine (ALPHAGAN) 0.2 % ophthalmic solution 1 drop  1 drop Left Eye Q12H Kyle, Tyrone A, DO   1 drop at 10/17/20 2239   And  . timolol (TIMOPTIC) 0.5 % ophthalmic solution 1 drop  1 drop Left Eye Q12H Kyle, Tyrone A, DO   1 drop at 10/17/20 2239  . brinzolamide (AZOPT) 1 % ophthalmic suspension 1 drop  1 drop Left Eye TID Marylyn Ishihara, Tyrone A, DO   1 drop at 10/17/20 2239  . ceFEPIme (MAXIPIME) 2 g in sodium chloride 0.9 % 100 mL IVPB  2 g Intravenous Q12H Polly Cobia, RPH 200 mL/hr at 10/17/20 2236 2 g at 10/17/20 2236  . Chlorhexidine Gluconate Cloth 2 % PADS 6 each  6 each Topical Daily Nita Sells, MD   6 each at 10/18/20 0701  . chlorpheniramine-HYDROcodone (TUSSIONEX) 10-8 MG/5ML suspension 5 mL  5 mL Oral Q12H PRN Marylyn Ishihara, Tyrone A, DO      . dextrose 5 % solution   Intravenous Continuous Nita Sells, MD 100 mL/hr at 10/17/20 2231 New Bag at 10/17/20 2231  . guaiFENesin-dextromethorphan (ROBITUSSIN DM) 100-10 MG/5ML syrup 10 mL  10 mL Oral Q4H PRN Marylyn Ishihara, Tyrone A, DO      . Ipratropium-Albuterol (COMBIVENT) respimat 1 puff  1 puff Inhalation Q6H PRN Nita Sells, MD      . methylPREDNISolone sodium succinate (SOLU-MEDROL) 125 mg/2 mL injection 55 mg  1 mg/kg Intravenous Q12H Kyle, Tyrone A, DO   55 mg at 10/17/20 2233   Followed by  . [START ON 10/19/2020] predniSONE (DELTASONE) tablet 50 mg  50 mg Oral Daily Kyle, Tyrone A, DO      . Netarsudil-Latanoprost 0.02-0.005 % SOLN   Both Eyes Daily Marylyn Ishihara, Tyrone A, DO      . remdesivir 100 mg in sodium chloride 0.9 % 100 mL IVPB  100 mg Intravenous Daily Kyle, Tyrone A, DO 200 mL/hr at 10/17/20 1053 100 mg at 10/17/20 1053  . sodium chloride flush (NS) 0.9 % injection 10-40 mL  10-40 mL Intracatheter PRN  Nita Sells, MD      . zinc sulfate capsule 220 mg  220 mg Oral Daily Kyle, Tyrone A, DO   220 mg at 10/17/20 1056     Discharge Medications: Please see discharge summary for a list of discharge medications.  Relevant Imaging Results:  Relevant Lab Results:   Additional Information [id number]  Purcell Mouton, RN

## 2020-10-18 NOTE — Discharge Summary (Signed)
Physician Discharge Summary  MATTHE PLUMLEY [id number] DOB: 03-12-1941 DOA: 10/15/2020  PCP: [facility] [place] date: 10/15/2020 Discharge date: 10/18/2020  Time spent: 37 minutes  Recommendations for Outpatient Follow-up:  1. Recommend Hospice follow patient at facility given debility and decline 2. Continue Eliquis at prescribed doses for DVT-note that there is a transition in dose after 7 days of the higher dose 3. Consider outpatient labs if family and physician at skilled facility feel this is indicated 4. 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 01/15/2020-colonic ileus-DNR/DNI  At baseline is not responsive-daughter noted patient was hypoxic-came to emergency room 1/11 and was on 15 L nonrebreather  Was positive for coronavirus 10/12/2020  Decompensated and worsened over this time from 1/8--1/11, he has been at [facility]  3 yrs ~ 4 yr had dementia he is total care at [facility]--he usually doesn't open his eyes, he cannot see typically and is minimally interactive  Initial labs obtained show sodium 161, potassium 3.2 BUN/creatinine 50/1.77 Inflammatory markers elevated White count 15.0, hemoglobin 11.8 Perfusion scan performed 1/11 showing probable pulmonary embolism  We had further discussions with Family about goals of care  Hospital Course:  1. Coronavirus 19 infection-unvaccinated a. Had minimal oxygen requirement during hospital stay and treated with baricitinib, methylprednisone  remdesivir and covered for pneumonia b. On discharge will prescribe Levaquin and prednisone to complete 5 and 10 days respectively c. Poor overall underlying prognosis 2. Possible superimposed pneumonia?   a. Stopped vancomycin as PCR is negative b. Procalcitonin down, transition to Levaquin to complete total 5 days on 10/19/2020 3. Pulmonary embolism based on Q scan a. Perfusion scan shows moderate probability for pulmonary embolus precipitated from cytokine cascade and inflammation b. Was on heparin transition to Eliquis-please note dosage changes on discharge c. Could discontinue this once goals clarified 4. Prior dysphagia and stricture with colonic ileus  a. Recurrent ileus and massive colon dilatation noted this admission poor candidate for decompression as per prior notes and I have discussed this clearly with family b. Keep on clear liquid diet on discharge 5. Severe hypernatremia on admission-155 on admission a. Improving slowly b. Was on various types of fluid medications during hospital stay unfortunately sodium creeped back up to 150 c. Patient is not eating and drinking d. Will need to be on clear liquid diet going forward given 6. AKI with metabolic acidosis on admission a. On admission severely volume depleted BUN/creatinine 50/1.7 b. improving steadily with IV fluid 7. CABG 1996 a. Holding at this time any nephrotoxins-does not appear to be on beta-blockade or ACE inhibitor from prior to admission 8. Rheumatoid arthritis a. Reported history of but no current treatment on prior to admission medications   Discharge Exam: Vitals:   10/18/20 0200 10/18/20 0701  BP: (!) 143/87 (!) 188/86  Pulse: (!) 55   Resp: 11   Temp:  97.6 F (36.4 C)  SpO2: 100%     General: Somnolent minimally responsive opens eyes to agitation however otherwise keeps eyes closed Nursing reports not really interactive Cardiovascular: S1-S2  no murmur no rub no gallop Respiratory: Clear no  added sounds Abdomen very distended and swollen more so than prior  Discharge Instructions   Discharge Instructions    Diet - low sodium heart healthy   Complete by: As directed    Increase activity slowly   Complete by: As directed    No wound care   Complete by: As directed      Allergies as of 10/18/2020      Reactions   Ace Inhibitors Other (See Comments)   "Allergic," per paperwork from [facility]   Lisinopril Other (See Comments)   "Allergic," per paperwork from [facility]   Zocor [simvastatin] Other (See Comments)   Muscle pain      Medication List    STOP taking these medications   aspirin EC 81 MG tablet     TAKE these medications   acetaminophen 325 MG tablet Commonly known as: TYLENOL Take 2 tablets (650 mg total) by mouth every 6 (six) hours as needed for mild pain (or Fever >/= 101).   apixaban 5 MG Tabs tablet Commonly known as: ELIQUIS Take 2 tablets (10 mg total) by mouth 2 (two) times daily for 6 days.   apixaban 5 MG Tabs tablet Commonly known as: ELIQUIS Take 1 tablet (5 mg total) by mouth 2 (two) times daily. Start taking on: October 24, 2020   bisacodyl 10 MG suppository Commonly known as: DULCOLAX Place 1 suppository (10 mg total) rectally daily.   brimonidine-timolol 0.2-0.5 % ophthalmic solution Commonly known as: COMBIGAN Place 1 drop into the left eye every 12 (twelve) hours.   brinzolamide 1 % ophthalmic suspension Commonly known as: AZOPT Place 1 drop into the left eye 3 (three) times daily.   ipratropium-albuterol 0.5-2.5 (3) MG/3ML Soln Commonly known as: DUONEB Take 3 mLs by nebulization every 6 (six) hours as needed (wheezing).   levofloxacin 500 MG tablet Commonly known as: Levaquin Take 1 tablet (500 mg total) by mouth daily for 2 days.   pantoprazole 40 MG tablet Commonly known as: PROTONIX Take 40 mg by mouth daily before breakfast.   polyethylene glycol 17 g packet Commonly known as: MIRALAX / GLYCOLAX Take  17 g by mouth daily.   potassium chloride SA 20 MEQ tablet Commonly known as: KLOR-CON Take 2 tablets (40 mEq total) by mouth 2 (two) times daily. What changed: when to take this   predniSONE 50 MG tablet Commonly known as: DELTASONE Finish all the [employer] taking on: October 19, 2020   ROBITUSSIN 12 HOUR COUGH PO Take 10 mLs by mouth every 4 (four) hours as needed (cough).   ROCKLATAN OP Place 1 drop into both eyes daily.      Allergies  Allergen Reactions  . Ace Inhibitors Other (See Comments)    "Allergic," per paperwork from [facility]  . Lisinopril Other (See Comments)    "Allergic," per paperwork from [facility]  . Zocor [Simvastatin] Other (See Comments)    Muscle pain      The results of significant diagnostics from this hospitalization (including imaging, microbiology, ancillary and laboratory) are listed below for reference.    Significant Diagnostic Studies: DG Abd 1 View  Result Date: 10/17/2020 CLINICAL DATA:  Ileus EXAM: ABDOMEN - 1 VIEW COMPARISON:  Numerous priors, most recent abdominal radiography 05/13/2020, chest radiograph 10/15/2020 FINDINGS: Massive distention of the bowel throughout the abdomen, appearance is quite similar to most recent chest radiography as well as more remote comparison abdominal radiography throughout 2020  and 2021. No suspicious abdominal calcifications. Vascular calcium seen in the abdomen and pelvis. Prior sternotomy and CABG. IMPRESSION: Massive distention of the bowel throughout the abdomen, appearance is quite similar to most recent chest radiography as well as more remote comparison abdominal radiography throughout 2020 and 2021. Can reflect a chronic ileus or dysmotility. Electronically Signed   By: Lovena Le M.D.   On: 10/17/2020 06:04   NM PULMONARY VENT AND PERF (V/Q Scan)  Result Date: 10/15/2020 CLINICAL DATA:  Recent diagnosis of COVID-19 infection with worsening hypoxia. Elevated D-dimer. Evaluate for  pulmonary embolism. EXAM: NUCLEAR MEDICINE PERFUSION LUNG SCAN TECHNIQUE: Perfusion images were obtained in multiple projections after intravenous injection of radiopharmaceutical. Ventilation scans intentionally deferred if perfusion scan and chest x-ray adequate for interpretation during COVID 19 epidemic. RADIOPHARMACEUTICALS:  4.4 mCi Tc-102m MAA IV COMPARISON:  Chest radiograph-earlier same day; 10/12/2020; CT abdomen and pelvis-01/04/2020 FINDINGS: Review of chest radiograph performed earlier same day demonstrates unchanged cardiac silhouette and mediastinal contours post median sternotomy and CABG. Minimal right infrahilar opacities, potentially representative of atelectasis. No pleural effusion or pneumothorax. Redemonstrated massive gaseous distension of the colon beneath the bilateral hemidiaphragms. Perfusion images demonstrate mottled appearance of the bilateral pulmonary parenchyma with apparent wedge-shaped defects involving the peripheral aspect of the right mid lung and basilar aspect of the left lower lung which are without radiographic correlate and thus worrisome for of areas of non perfusion due to pulmonary embolism though examination degraded secondary to lack of ventilatory images. IMPRESSION: Indeterminate though worrisome perfusion only nuclear medicine lung scan. Findings worrisome for pulmonary embolism with geographic wedge-shaped areas of non perfusion involving the peripheral aspect of the right mid lung and left lung base, incompletely evaluated due to lack of ventilatory images. If the diagnosis of pulmonary embolism remains indeterminate clinically, further evaluation with bilateral lower extremity venous Doppler ultrasound and/or contrast-enhanced chest CT could be performed as indicated. Electronically Signed   By: Sandi Mariscal M.D.   On: 10/15/2020 16:51   DG Chest Port 1 View  Result Date: 10/15/2020 CLINICAL DATA:  80 year old male with hypoxia, COVID positive. EXAM: PORTABLE  CHEST 1 VIEW COMPARISON:  10/12/2020 FINDINGS: The heart size and mediastinal contours are within normal limits. Similar appearing postsurgical changes after CABG. Both lungs are clear. Similar appearing massively dilated loops of bowel in the bilateral upper abdomen. Atherosclerotic calcification of the aortic arch. No acute osseous abnormality. IMPRESSION: No acute cardiopulmonary process. Unchanged massively dilated loops of bowel in the bilateral upper quadrants. Electronically Signed   By: Ruthann Cancer MD   On: 10/15/2020 07:56   DG Chest Port 1 View  Result Date: 10/12/2020 CLINICAL DATA:  Hypoxia. EXAM: PORTABLE CHEST 1 VIEW COMPARISON:  May 13, 2020 FINDINGS: No pneumothorax. The heart, hila, mediastinum are normal. No pulmonary nodules or masses. No focal infiltrates. Air-filled dilated loops of colon are seen under both the right left hemidiaphragm, unchanged. IMPRESSION: 1. No active disease. 2. Dilated loops of colon remain in the upper abdomen. Electronically Signed   By: Dorise Bullion III M.D   On: 10/12/2020 18:13    Microbiology: Recent Results (from the past 240 hour(s))  Resp Panel by RT-PCR (Flu A&B, Covid) Nasopharyngeal Swab     Status: Abnormal   Collection Time: 10/12/20  5:41 PM   Specimen: Nasopharyngeal Swab; Nasopharyngeal(NP) swabs in vial transport medium  Result Value Ref Range Status   SARS Coronavirus 2 by RT PCR POSITIVE (A) NEGATIVE Final    Comment: Carver Fila RN  10/12/20 @1953  BY P.HENDERSON (NOTE) SARS-CoV-2 target nucleic acids are DETECTED.  The SARS-CoV-2 RNA is generally detectable in upper respiratory specimens during the acute phase of infection. Positive results are indicative of the presence of the identified virus, but do not rule out bacterial infection or co-infection with other pathogens not detected by the test. Clinical correlation with patient history and other diagnostic information is necessary to determine patient infection status.  The expected result is Negative.  Fact Sheet for Patients: EntrepreneurPulse.com.au  Fact Sheet for Healthcare Providers: IncredibleEmployment.be  This test is not yet approved or cleared by the [place] FDA and  has been authorized for detection and/or diagnosis of SARS-CoV-2 by FDA under an Emergency Use Authorization (EUA).  This EUA will remain in effect (meaning this test can be used) for the duration of  the COVID -19 declaration under Section 564(b)(1) of the Act, 21 U.S.C. section 360bbb-3(b)(1), unless the authorization is terminated or revoked sooner.     Influenza A by PCR NEGATIVE NEGATIVE Final   Influenza B by PCR NEGATIVE NEGATIVE Final    Comment: (NOTE) The Xpert Xpress SARS-CoV-2/FLU/RSV plus assay is intended as an aid in the diagnosis of influenza from Nasopharyngeal swab specimens and should not be used as a sole basis for treatment. Nasal washings and aspirates are unacceptable for Xpert Xpress SARS-CoV-2/FLU/RSV testing.  Fact Sheet for Patients: EntrepreneurPulse.com.au  Fact Sheet for Healthcare Providers: IncredibleEmployment.be  This test is not yet approved or cleared by the [place] FDA and has been authorized for detection and/or diagnosis of SARS-CoV-2 by FDA under an Emergency Use Authorization (EUA). This EUA will remain in effect (meaning this test can be used) for the duration of the COVID-19 declaration under Section 564(b)(1) of the Act, 21 U.S.C. section 360bbb-3(b)(1), unless the authorization is terminated or revoked.  Performed at [facility], [place] [address] [place], [place] [address]   Blood Culture (routine x 2)     Status: None   Collection Time: 10/12/20  5:41 PM   Specimen: BLOOD RIGHT FOREARM  Result Value Ref Range Status   Specimen Description   Final    BLOOD RIGHT FOREARM Performed at [facility], [place]  [address], [place], [place] [address]    Special Requests   Final    BOTTLES DRAWN AEROBIC AND ANAEROBIC Blood Culture adequate volume Performed at [place] [address], [place], [place] [address]    Culture   Final    NO GROWTH 5 DAYS Performed at [facility], [place] [address], [place], [place] [address]    Report Status 10/17/2020 FINAL  Final  Blood Culture (routine x 2)     Status: None   Collection Time: 10/12/20  5:46 PM   Specimen: BLOOD LEFT FOREARM  Result Value Ref Range Status   Specimen Description   Final    BLOOD LEFT FOREARM Performed at [facility], [place] [address] [place], [place] [address]    Special Requests   Final    BOTTLES DRAWN AEROBIC AND ANAEROBIC Blood Culture adequate volume Performed at [place] [address] [place], [place] [address]    Culture   Final    NO GROWTH 5 DAYS Performed at [facility], [place] [address], [place], [place] [address]    Report Status 10/17/2020 FINAL  Final  Blood Culture (routine x 2)     Status: None (Preliminary result)   Collection Time:  10/15/20  7:32 AM   Specimen: BLOOD  Result Value Ref Range Status   Specimen Description   Final    BLOOD BLOOD LEFT FOREARM Performed at [place] [address], [place], [place] [address]    Special Requests   Final    BOTTLES DRAWN AEROBIC AND ANAEROBIC Blood Culture results may not be optimal due to an inadequate volume of blood received in culture bottles Performed at [place] [address], [place], [place] [address]    Culture   Final    NO GROWTH 3 DAYS Performed at [facility], [place] [address] [place], [place] [address]    Report Status PENDING  Incomplete  Blood Culture (routine x 2)     Status: None (Preliminary result)   Collection Time: 10/15/20  7:32 AM   Specimen: BLOOD  Result Value Ref Range Status   Specimen Description   Final    BLOOD  BLOOD RIGHT FOREARM Performed at [place] [address] [place], [place] [address]    Special Requests   Final    BOTTLES DRAWN AEROBIC AND ANAEROBIC Blood Culture adequate volume Performed at [place] [address], [place], [place] [address]    Culture   Final    NO GROWTH 3 DAYS Performed at [facility], [place] [address], [place], [place] [address]    Report Status PENDING  Incomplete  Culture, Urine     Status: None   Collection Time: 10/15/20  1:09 PM   Specimen: Urine, Catheterized  Result Value Ref Range Status   Specimen Description   Final    URINE, CATHETERIZED Performed at [place] [address] [place], [place] [address]    Special Requests   Final    NONE Performed at [facility], [place] [address], [place], [place] [address]    Culture   Final    NO GROWTH Performed at [facility], [place] [address] [place], [place] [address]    Report Status 10/16/2020 FINAL  Final  MRSA PCR Screening     Status: None   Collection Time: 10/15/20  7:43 PM   Specimen: Nasopharyngeal  Result Value Ref Range Status   MRSA by PCR NEGATIVE NEGATIVE Final    Comment:        The GeneXpert MRSA Assay (FDA approved for NASAL specimens only), is one component of a comprehensive MRSA colonization surveillance program. It is not intended to diagnose MRSA infection nor to guide or monitor treatment for MRSA infections. Performed at [facility], [place] [address] [place], [place] [address]      Labs: Basic Metabolic Panel: Recent Labs  Lab 10/15/20 0732 10/15/20 1309 10/15/20 1732 10/16/20 1040 10/16/20 2015 10/17/20 0400 10/18/20 0508  NA 161*  --   --  162* 154* 152* 156*  K 3.2*  --   --  3.0* 3.9 4.3 3.0*  CL 127*  --   --  >130* 127* 123* 125*  CO2 19*  --   --  25 19* 18* 19*  GLUCOSE 147*  --   --  120* 323* 385* 166*  BUN 50*  --   --  52*  48* 46* 43*  CREATININE 1.77*  --  1.66* 1.34* 1.20 1.15 1.03  CALCIUM 9.0  --   --  8.9 8.4* 8.3* 8.8*  MG  --  2.7*  --  2.8*  --  2.5*  --   PHOS  --   --   --  1.7*  --  2.2* 2.5   Liver Function Tests: Recent Labs  Lab 10/12/20 1741 10/15/20 0732 10/16/20 1040  AST 40 20 23  ALT 42 23 19  ALKPHOS 70 74 63  BILITOT 0.6 1.5* 0.8  PROT 7.6 8.4* 7.3  ALBUMIN 3.4* 3.4* 2.8*   No results for input(s): LIPASE, AMYLASE in the last 168 hours. No results for input(s): AMMONIA in the last 168 hours. CBC: Recent Labs  Lab 10/12/20 1741 10/15/20 0732 10/15/20 1732 10/16/20 1040 10/17/20 0400 10/18/20 0508  WBC 7.2 13.7* 15.0* 18.7* 13.8* 9.7  NEUTROABS 4.9 11.9*  --  17.0* 12.9* 8.8*  HGB 11.0* 13.5 11.8* 11.6* 10.1* 10.2*  HCT 39.1 47.5 40.4 39.4 34.2* 34.2*  MCV 71.1* 72.0* 70.8* 69.5* 69.2* 68.7*  PLT 287 427* 296 306 257 265   Cardiac Enzymes: No results for input(s): CKTOTAL, CKMB, CKMBINDEX, TROPONINI in the last 168 hours. BNP: BNP (last 3 results) No results for input(s): BNP in the last 8760 hours.  ProBNP (last 3 results) No results for input(s): PROBNP in the last 8760 hours.  CBG: No results for input(s): GLUCAP in the last 168 hours.     Signed:  Nita Sells MD   [facility] 10/18/2020, 9:13 AM

## 2020-10-18 NOTE — Care Management Important Message (Signed)
Important Message  Patient Details IM Letter placed in Patients door [facility].  Name: Justin Griffin MRN: [id number] Date of Birth: 1941/07/25   Medicare Important Message Given:  Yes     Kerin Salen 10/18/2020, 12:10 PM

## 2020-10-20 LAB — CULTURE, BLOOD (ROUTINE X 2)
Culture: NO GROWTH
Culture: NO GROWTH
Special Requests: ADEQUATE

## 2020-11-05 DEATH — deceased
